# Patient Record
Sex: Female | Born: 1935 | Race: White | Hispanic: No | Smoking: Never smoker
Health system: Southern US, Community
[De-identification: ages and names within clinical notes are randomized; demographics above are authoritative.]

## PROBLEM LIST (undated history)

## (undated) DIAGNOSIS — I10 Essential (primary) hypertension: Secondary | ICD-10-CM

## (undated) DIAGNOSIS — F329 Major depressive disorder, single episode, unspecified: Secondary | ICD-10-CM

## (undated) DIAGNOSIS — I639 Cerebral infarction, unspecified: Secondary | ICD-10-CM

## (undated) DIAGNOSIS — F32A Depression, unspecified: Secondary | ICD-10-CM

## (undated) DIAGNOSIS — D649 Anemia, unspecified: Secondary | ICD-10-CM

## (undated) DIAGNOSIS — B159 Hepatitis A without hepatic coma: Secondary | ICD-10-CM

## (undated) DIAGNOSIS — I499 Cardiac arrhythmia, unspecified: Secondary | ICD-10-CM

## (undated) HISTORY — PX: CATARACT EXTRACTION W/ INTRAOCULAR LENS IMPLANT: SHX1309

---

## 1964-08-04 HISTORY — PX: OOPHORECTOMY: SHX86

## 1966-11-03 HISTORY — PX: BREAST SURGERY: SHX581

## 1968-01-03 HISTORY — PX: ABDOMINAL HYSTERECTOMY: SHX81

## 1976-01-03 DIAGNOSIS — B159 Hepatitis A without hepatic coma: Secondary | ICD-10-CM

## 1976-01-03 HISTORY — DX: Hepatitis a without hepatic coma: B15.9

## 2001-02-01 DIAGNOSIS — I499 Cardiac arrhythmia, unspecified: Secondary | ICD-10-CM

## 2001-02-01 HISTORY — DX: Cardiac arrhythmia, unspecified: I49.9

## 2008-07-04 DIAGNOSIS — D649 Anemia, unspecified: Secondary | ICD-10-CM

## 2008-07-04 HISTORY — DX: Anemia, unspecified: D64.9

## 2008-08-04 HISTORY — PX: COLON SURGERY: SHX602

## 2012-03-04 HISTORY — PX: PANCREATIC CYST DRAINAGE: SHX2156

## 2013-02-16 ENCOUNTER — Other Ambulatory Visit (HOSPITAL_COMMUNITY): Payer: Self-pay | Admitting: *Deleted

## 2013-02-16 DIAGNOSIS — C189 Malignant neoplasm of colon, unspecified: Secondary | ICD-10-CM

## 2013-02-16 DIAGNOSIS — D490 Neoplasm of unspecified behavior of digestive system: Secondary | ICD-10-CM

## 2013-03-03 ENCOUNTER — Ambulatory Visit (HOSPITAL_COMMUNITY): Payer: Self-pay

## 2013-03-30 ENCOUNTER — Other Ambulatory Visit (HOSPITAL_COMMUNITY): Payer: Self-pay

## 2013-03-31 ENCOUNTER — Ambulatory Visit (HOSPITAL_COMMUNITY)
Admission: RE | Admit: 2013-03-31 | Discharge: 2013-03-31 | Disposition: A | Discharge status: Home / Self Care | Payer: Medicare Other | Source: Ambulatory Visit | Attending: Oncology | Admitting: Oncology

## 2013-03-31 DIAGNOSIS — C189 Malignant neoplasm of colon, unspecified: Secondary | ICD-10-CM | POA: Insufficient documentation

## 2013-03-31 DIAGNOSIS — K862 Cyst of pancreas: Secondary | ICD-10-CM | POA: Insufficient documentation

## 2013-03-31 DIAGNOSIS — Q619 Cystic kidney disease, unspecified: Secondary | ICD-10-CM | POA: Insufficient documentation

## 2013-03-31 DIAGNOSIS — I700 Atherosclerosis of aorta: Secondary | ICD-10-CM | POA: Insufficient documentation

## 2013-03-31 DIAGNOSIS — D490 Neoplasm of unspecified behavior of digestive system: Secondary | ICD-10-CM

## 2013-03-31 LAB — CREATININE, SERUM
Creatinine, Ser: 0.83 mg/dL (ref 0.50–1.10)
GFR calc non Af Amer: 65 mL/min — ABNORMAL LOW (ref >90)

## 2013-03-31 LAB — BUN: BUN: 16 mg/dL (ref 6–23)

## 2013-03-31 MED ORDER — IOHEXOL 300 MG/ML  SOLN
100.0000 mL | Freq: Once | INTRAMUSCULAR | Status: AC | PRN
  Administered 2013-03-31: 100 mL via INTRAVENOUS

## 2014-09-21 ENCOUNTER — Inpatient Hospital Stay (HOSPITAL_COMMUNITY)
Admission: AD | Admit: 2014-09-21 | Discharge: 2014-09-26 | DRG: 065 | Disposition: A | Discharge status: Rehabilitation Facility | Payer: Medicare Other | Source: Other Acute Inpatient Hospital | Attending: Neurosurgery | Admitting: Neurosurgery

## 2014-09-21 ENCOUNTER — Inpatient Hospital Stay (HOSPITAL_COMMUNITY): Payer: Medicare Other

## 2014-09-21 DIAGNOSIS — H4922 Sixth [abducent] nerve palsy, left eye: Secondary | ICD-10-CM | POA: Diagnosis present

## 2014-09-21 DIAGNOSIS — I609 Nontraumatic subarachnoid hemorrhage, unspecified: Secondary | ICD-10-CM

## 2014-09-21 DIAGNOSIS — I10 Essential (primary) hypertension: Secondary | ICD-10-CM | POA: Diagnosis present

## 2014-09-21 DIAGNOSIS — R291 Meningismus: Secondary | ICD-10-CM | POA: Diagnosis present

## 2014-09-21 DIAGNOSIS — D649 Anemia, unspecified: Secondary | ICD-10-CM | POA: Diagnosis present

## 2014-09-21 DIAGNOSIS — F329 Major depressive disorder, single episode, unspecified: Secondary | ICD-10-CM | POA: Diagnosis present

## 2014-09-21 DIAGNOSIS — I608 Other nontraumatic subarachnoid hemorrhage: Principal | ICD-10-CM | POA: Diagnosis present

## 2014-09-21 DIAGNOSIS — G919 Hydrocephalus, unspecified: Secondary | ICD-10-CM | POA: Diagnosis present

## 2014-09-21 DIAGNOSIS — Z79899 Other long term (current) drug therapy: Secondary | ICD-10-CM | POA: Diagnosis not present

## 2014-09-21 DIAGNOSIS — H532 Diplopia: Secondary | ICD-10-CM | POA: Diagnosis present

## 2014-09-21 DIAGNOSIS — R29818 Other symptoms and signs involving the nervous system: Secondary | ICD-10-CM

## 2014-09-21 HISTORY — DX: Cardiac arrhythmia, unspecified: I49.9

## 2014-09-21 HISTORY — DX: Hepatitis a without hepatic coma: B15.9

## 2014-09-21 HISTORY — DX: Anemia, unspecified: D64.9

## 2014-09-21 LAB — MRSA PCR SCREENING: MRSA BY PCR: NEGATIVE

## 2014-09-21 MED ORDER — IOHEXOL 350 MG/ML SOLN: 50.0000 mL | Freq: Once | INTRAVENOUS | Status: AC | PRN

## 2014-09-21 MED ORDER — LABETALOL HCL 5 MG/ML IV SOLN: 10.0000 mg | Freq: Two times a day (BID) | INTRAVENOUS | Status: DC | PRN

## 2014-09-21 MED ORDER — DEXAMETHASONE SODIUM PHOSPHATE 10 MG/ML IJ SOLN
6.0000 mg | Freq: Four times a day (QID) | INTRAMUSCULAR | Status: DC
  Administered 2014-09-21 – 2014-09-26 (×23): 6 mg via INTRAVENOUS
  Filled 2014-09-21 (×3): qty 0.6
  Filled 2014-09-21: qty 1
  Filled 2014-09-21: qty 0.6
  Filled 2014-09-21: qty 1
  Filled 2014-09-21: qty 0.6
  Filled 2014-09-21: qty 1
  Filled 2014-09-21 (×6): qty 0.6
  Filled 2014-09-21: qty 1
  Filled 2014-09-21: qty 0.6
  Filled 2014-09-21: qty 1
  Filled 2014-09-21: qty 0.6
  Filled 2014-09-21: qty 1
  Filled 2014-09-21 (×2): qty 0.6
  Filled 2014-09-21 (×2): qty 1
  Filled 2014-09-21 (×5): qty 0.6

## 2014-09-21 MED ORDER — CETYLPYRIDINIUM CHLORIDE 0.05 % MT LIQD: 7.0000 mL | Freq: Two times a day (BID) | OROMUCOSAL | Status: DC

## 2014-09-21 MED ORDER — HYDROCODONE-ACETAMINOPHEN 5-325 MG PO TABS
1.0000 | ORAL_TABLET | ORAL | Status: DC | PRN
  Administered 2014-09-21 – 2014-09-26 (×6): 1 via ORAL
  Filled 2014-09-21 (×7): qty 1

## 2014-09-21 MED ORDER — MORPHINE SULFATE 2 MG/ML IJ SOLN
2.0000 mg | INTRAMUSCULAR | Status: DC | PRN
  Administered 2014-09-21: 2 mg via INTRAVENOUS
  Filled 2014-09-21 (×2): qty 1

## 2014-09-21 MED ORDER — NIMODIPINE 30 MG PO CAPS
60.0000 mg | ORAL_CAPSULE | Freq: Four times a day (QID) | ORAL | Status: DC
  Administered 2014-09-21 – 2014-09-22 (×4): 60 mg via ORAL
  Filled 2014-09-21 (×5): qty 2

## 2014-09-21 MED ORDER — LACTATED RINGERS IV SOLN
INTRAVENOUS | Status: DC
  Administered 2014-09-21 – 2014-09-26 (×7): via INTRAVENOUS

## 2014-09-21 MED ORDER — CHLORHEXIDINE GLUCONATE 0.12 % MT SOLN
15.0000 mL | Freq: Two times a day (BID) | OROMUCOSAL | Status: DC
  Administered 2014-09-21 – 2014-09-22 (×2): 15 mL via OROMUCOSAL
  Filled 2014-09-21 (×2): qty 15

## 2014-09-21 MED ORDER — ONDANSETRON HCL 4 MG/2ML IJ SOLN: 4.0000 mg | Freq: Four times a day (QID) | INTRAMUSCULAR | Status: DC

## 2014-09-21 MED ORDER — ONDANSETRON HCL 4 MG/2ML IJ SOLN
4.0000 mg | Freq: Four times a day (QID) | INTRAMUSCULAR | Status: DC | PRN
Start: 2014-09-21 — End: 2014-09-26

## 2014-09-21 NOTE — Progress Notes (Signed)
Pt transferred here from [facility]. Daughter updated and at bedside.

## 2014-09-21 NOTE — H&P (Signed)
Subjective: The patient is an 81 year old white female who by report had a sudden onset of headache yesterday. She does not recall any precipitating events. There is no history of trauma. The patient went to the [facility] ER and was evaluated with a head CT. This demonstrated a subarachnoid hemorrhage. The ER doctor request a transfer to [facility].  Presently the patient is accompanied by her daughter who feels that a lot of the details. The patient admits to a headache but says "it's not too bad". She denies trauma, neck pain, meningismus, etc.   No past medical history on file.  No past surgical history on file.  No Known Allergies  History  Substance Use Topics  . Smoking status: Not on file  . Smokeless tobacco: Not on file  . Alcohol Use: Not on file    No family history on file. Prior to Admission medications   Medication Sig Start Date End Date Taking? Authorizing Provider  alendronate (FOSAMAX) 70 MG tablet Take 70 mg by mouth once a week. Take with a full glass of water on an empty stomach.   Yes Historical Provider, MD  fluticasone (FLONASE) 50 MCG/ACT nasal spray Place 2 sprays into both nostrils daily as needed for allergies or rhinitis.   Yes Historical Provider, MD  loratadine (CLARITIN) 10 MG tablet Take 10 mg by mouth daily.   Yes Historical Provider, MD  mirtazapine (REMERON) 15 MG tablet Take 15 mg by mouth at bedtime.   Yes Historical Provider, MD  polyethylene glycol (MIRALAX / GLYCOLAX) packet Take 17 g by mouth daily.   Yes Historical Provider, MD     Review of Systems  Positive ROS: As above  All other systems have been reviewed and were otherwise negative with the exception of those mentioned in the HPI and as above.  Objective: Vital signs in last 24 hours: Temp:  [97.6 F (36.4 C)-98.6 F (37 C)] 98.5 F (36.9 C) (02/18 1147) Pulse Rate:  [79-96] 96 (02/18 0900) Resp:  [12-20] 19 (02/18 0900) BP: (124-160)/(53-70) 143/65 mmHg (02/18  0900) SpO2:  [95 %-100 %] 98 % (02/18 0900) Weight:  [60.4 kg (133 lb 2.5 oz)] 60.4 kg (133 lb 2.5 oz) (02/18 0254)  Physical exam:  General: An alert and pleasant 81 year old white female in no apparent distress. She has somewhat of a flat affect.  HEENT: Normocephalic, atraumatic, pupils equal round reactive light, extraocular muscles are intact  Neck: Supple without masses, deformities, meningismus, etc.  Thorax: Symmetric  Abdomen: Soft  Extremities: Unremarkable  Neurologic exam: The patient is Glasgow Coma Scale 14,[id number]. She is alert and oriented 1, person. She thinks she is in [place]. She could not tell me the month. The patient's motor strength is 5 over 5 in her bilateral bicep, tricep, hand grip, gastrocnemius, dorsiflexors . Cerebellar function is intact to rapid alternating movements of the upper extremities bilaterally. Sensory function is intact to light touch sensation all tested dermatomes bilaterally. Cranial nerves II through XII were examined bilaterally and grossly normal. Vision and hearing are grossly normal bilaterally.  I have reviewed the patient's head CT performed at [facility] today. There is a subarachnoid hemorrhage in the basal cisterns with interventricular hemorrhage and mild hydrocephalus.  By report the CT angiogram is negative except for a small abnormality in the cavernous sinus.  Data Review No results found for: WBC, HGB, HCT, MCV, PLT Lab Results  Component Value Date   BUN 16 03/31/2013   CREATININE 0.83 03/31/2013  No results found for: INR, PROTIME  Assessment/Plan: Nontraumatic subarachnoid hemorrhage, intraventricular hemorrhage, hydrocephalus: I have discussed the situation with the patient and her daughter. The patient will need further workup with a cerebral arteriogram or a repeat CT angiogram to rule out aneurysm, arteriovenous malformation, etc. We will continue to observe her in the ICU for worsening  hydrocephalus, vasospasm, recurrent hemorrhage, etc. I have answered all the patient, and her daughters, questions. Dr. Kathyrn Sheriff is going to discuss an arteriogram with the patient.  Garris Melhorn D 09/21/2014 12:22 PM

## 2014-09-21 NOTE — Progress Notes (Signed)
eLink Physician-Brief Progress Note Patient Name: Diana Hawkins DOB: 02/27/1932 MRN: [id number]   Date of Service  09/21/2014  HPI/Events of Note  New arrival from an outside hospital Neurosurgery primary service [facility] per discussion with RN BP stable Patient comfortable on camera check  eICU Interventions  No eICU intervention     Intervention Category Evaluation Type: New Patient Evaluation  Draven Natter 09/21/2014, 3:47 AM

## 2014-09-22 ENCOUNTER — Inpatient Hospital Stay (HOSPITAL_COMMUNITY): Payer: Medicare Other

## 2014-09-22 ENCOUNTER — Encounter (HOSPITAL_COMMUNITY): Payer: Self-pay | Admitting: *Deleted

## 2014-09-22 DIAGNOSIS — I609 Nontraumatic subarachnoid hemorrhage, unspecified: Secondary | ICD-10-CM

## 2014-09-22 HISTORY — PX: IR GENERIC HISTORICAL: IMG1180011

## 2014-09-22 MED ORDER — FENTANYL CITRATE 0.05 MG/ML IJ SOLN
INTRAMUSCULAR | Status: AC | PRN
  Administered 2014-09-22: 25 ug via INTRAVENOUS

## 2014-09-22 MED ORDER — IOHEXOL 300 MG/ML  SOLN
150.0000 mL | Freq: Once | INTRAMUSCULAR | Status: AC | PRN
  Administered 2014-09-22: 50 mL via INTRA_ARTERIAL

## 2014-09-22 MED ORDER — LIDOCAINE HCL 1 % IJ SOLN
INTRAMUSCULAR | Status: AC
  Filled 2014-09-22: qty 20

## 2014-09-22 MED ORDER — MIDAZOLAM HCL 2 MG/2ML IJ SOLN
INTRAMUSCULAR | Status: AC | PRN
  Administered 2014-09-22: 0.5 mg via INTRAVENOUS

## 2014-09-22 MED ORDER — MIDAZOLAM HCL 2 MG/2ML IJ SOLN
INTRAMUSCULAR | Status: AC
  Filled 2014-09-22: qty 2

## 2014-09-22 MED ORDER — FENTANYL CITRATE 0.05 MG/ML IJ SOLN
INTRAMUSCULAR | Status: AC
  Filled 2014-09-22: qty 2

## 2014-09-22 MED ORDER — NIMODIPINE 60 MG/20ML PO SOLN
60.0000 mg | Freq: Four times a day (QID) | ORAL | Status: DC
  Administered 2014-09-22 – 2014-09-26 (×19): 60 mg via ORAL
  Filled 2014-09-22 (×21): qty 20

## 2014-09-22 NOTE — Sedation Documentation (Signed)
Patient denies pain and is resting comfortably.  

## 2014-09-22 NOTE — Progress Notes (Signed)
Pt seen and examined. No issues overnight. Doing well this am, minimal HA. Has blurry vision.  I reviewed her admission history from the EMR and from the patient and her daughter.   EXAM: Temp:  [97.9 F (36.6 C)-99 F (37.2 C)] 98.3 F (36.8 C) (02/19 0737) Pulse Rate:  [66-96] 72 (02/19 0800) Resp:  [11-20] 13 (02/19 0800) BP: (108-143)/(45-72) 132/58 mmHg (02/19 0800) SpO2:  [94 %-100 %] 97 % (02/19 0800) Intake/Output      02/18 0701 - 02/19 0700 02/19 0701 - 02/20 0700   I.V. (mL/kg) 1800 (29.8)    Total Intake(mL/kg) 1800 (29.8)    Urine (mL/kg/hr) 325 (0.2)    Stool     Total Output 325     Net +1475          Urine Occurrence 9 x     Awake, alert, oriented Speech fluent CN grossly intact x left CN VI palsy Good strength throughout  LABS: Lab Results  Component Value Date   CREATININE 0.83 03/31/2013   BUN 16 03/31/2013   No results found for: WBC, HGB, HCT, MCV, PLT  IMAGING: CTA reviewed, no identifiable intracranial aneurysm is seen. Diffuse basal SAH, with mild ventriculomegaly.  IMPRESSION: - 81 y.o. female Hunt-Hess 2 Fisher 3/4 SAH with CTA negative for aneurysm.  PLAN: - Will do diagnostic angiogram today  I spoke with the patient and her daughter last night and explained to them that intracranial aneurysm was the most common non-traumatic cause for [facility] and that the definitive diagnosis is made by diagnostic angiogram. The risks of the angiogram were also reviewed to include stroke. The patient and her duaghter appeared to understand our discussion and the daughter provided consent to proceed with diagnostic angiogram.

## 2014-09-22 NOTE — Progress Notes (Signed)
UR completed.  Logann Whitebread, RN BSN MHA CCM Trauma/Neuro ICU Case Manager [phone]  

## 2014-09-22 NOTE — Brief Op Note (Signed)
PREOP DX: SAH  POSTOP DX: Same  PROCEDURE: Diagnostic cerebral angiogram  SURGEON: Dr. [name], MD  ANESTHESIA: IV Sedation with Local  EBL: Minimal  SPECIMENS: None  COMPLICATIONS: None  CONDITION: Stable to recovery  FINDINGS: 1. No intracranial aneurysms, AVM, or high-flow fistulas seen. 2. No evidence of vasospasm

## 2014-09-22 NOTE — Progress Notes (Addendum)
Pt began complaining of seeing things in "double vision." Some blurriness present. Seems to be more blurry in left eye. Partial gaze palsy on L side. Patient alert and oriented x 4. No other neuro changes. MD notified. Will continue to monitor.

## 2014-09-22 NOTE — Progress Notes (Signed)
[name], female   DOB: 06-Oct-1931, 81 y.o.   MRN: [id number] Subjective:  The patient is alert and pleasant. She looks better than yesterday. She has some diplopia. She says her headache is better than yesterday. She has some meningismus.  Objective: Vital signs in last 24 hours: Temp:  [97.9 F (36.6 C)-99 F (37.2 C)] 98 F (36.7 C) (02/19 0355) Pulse Rate:  [66-96] 74 (02/19 0700) Resp:  [11-20] 13 (02/19 0700) BP: (108-143)/(45-72) 108/72 mmHg (02/19 0700) SpO2:  [94 %-100 %] 100 % (02/19 0700)  Intake/Output from previous day: 02/18 0701 - 02/19 0700 In: 1800 [I.V.:1800] Out: 325 [Urine:325] Intake/Output this shift:    Physical exam the patient is alert and oriented 3, Glasgow Coma Scale 15. Her strength is normal in all 4 extremities. Her speech is normal. She has a left sixth nerve palsy.  Lab Results: No results for input(s): WBC, HGB, HCT, PLT in the last 72 hours. BMET No results for input(s): NA, K, CL, CO2, GLUCOSE, BUN, CREATININE, CALCIUM in the last 72 hours.  Studies/Results: Ct Angio Head W/cm &/or Wo Cm  09/21/2014   CLINICAL DATA:  Subarachnoid hemorrhage.  Rule out aneurysm.  EXAM: CT ANGIOGRAPHY HEAD  TECHNIQUE: Multidetector CT imaging of the head was performed using the standard protocol during bolus administration of intravenous contrast. Multiplanar CT image reconstructions and MIPs were obtained to evaluate the vascular anatomy.  CONTRAST:  50 mL Omnipaque 350 IV  COMPARISON:  None.  FINDINGS: CT HEAD  Brain: Moderate volume subarachnoid hemorrhage. Subarachnoid hemorrhage is centered in the left suprasellar cistern extending into the left perimesencephalic cistern and left sylvian fissure. Smaller amount of blood is present on the right. There is intraventricular extension of blood with mild hydrocephalus. Mild chronic microvascular ischemia. No acute infarct or mass lesion identified.  Calvarium and skull base: Negative  Paranasal sinuses:  Negative  Orbits: Negative  CTA HEAD  Anterior circulation: Internal carotid artery is widely patent bilaterally with mild atherosclerotic calcification but no significant stenosis. The anterior and middle cerebral arteries are widely patent. Negative for vasospasm.  Small 1 mm bump in the cavernous carotid projecting medially just above the ophthalmic artery. This is quite small and may be related atherosclerotic disease or a small aneurysm. This seems unlikely as the cause of the current subarachnoid hemorrhage.  Posterior circulation: Both vertebral arteries are widely patent to the basilar. PICA not well visualized. AICA, superior cerebellar, posterior cerebral arteries are widely patent bilaterally. No aneurysm in the posterior circulation.  Venous sinuses: Limited opacification of the dural sinuses without thrombosis.  Anatomic variants: None  Delayed phase:Normal enhancement.  3D volume rendered images of the circle Willis were obtained and sent to [facility] for review.  IMPRESSION: Moderate subarachnoid hemorrhage.  Negative for posterior communicating artery aneurysm. There is a small 1 mm bump on the left medial cavernous carotid which could be related to atherosclerotic disease or a small aneurysm. This may be unrelated to the hemorrhage. Close follow-up with either CTA or follow-up catheter angiography suggested for further evaluation.  Mild hydrocephalus with intraventricular hemorrhage.   Electronically Signed   By: Franchot Gallo M.D.   On: 09/21/2014 11:57    Assessment/Plan: Nontraumatic subarachnoid hemorrhage, intraventricular hemorrhage, hydrocephalus: The patient looks better and her headache has improved since yesterday. She does have a left sixth nerve palsy which I suspect is secondary to her hydrocephalus. We will continue with observation as I think the risk of lacing a ventriculostomy for hydrocephalus  outweigh the benefits at this point. Dr. Kathyrn Sheriff plans to do an arteriogram on her in  the near future. Her CT angiogram yesterday was negative.  LOS: 1 day     Haroun Cotham D 09/22/2014, 7:25 AM

## 2014-09-23 MED ORDER — POLYETHYLENE GLYCOL 3350 17 G PO PACK
17.0000 g | PACK | Freq: Every day | ORAL | Status: DC | PRN
  Filled 2014-09-23: qty 1

## 2014-09-23 NOTE — Progress Notes (Signed)
[name], female   DOB: 1932/01/25, 81 y.o.   MRN: [id number] Subjective: Patient reports feeling good  Objective: Vital signs in last 24 hours: Temp:  [97.7 F (36.5 C)-98.7 F (37.1 C)] 97.7 F (36.5 C) (02/20 0800) Pulse Rate:  [58-85] 81 (02/20 1000) Resp:  [12-18] 15 (02/20 1000) BP: (98-140)/(37-60) 129/50 mmHg (02/20 1000) SpO2:  [94 %-100 %] 100 % (02/20 1000)  Intake/Output from previous day: 02/19 0701 - 02/20 0700 In: 1845 [P.O.:120; I.V.:1725] Out: 1750 [Urine:1750] Intake/Output this shift: Total I/O In: 225 [I.V.:225] Out: 225 [Urine:225]  she is awake, alert, converasant; follows complex commands  Lab Results: No results for input(s): WBC, HGB, HCT, PLT in the last 72 hours. BMET No results for input(s): NA, K, CL, CO2, GLUCOSE, BUN, CREATININE, CALCIUM in the last 72 hours.  Studies/Results: Ct Angio Head W/cm &/or Wo Cm  09/21/2014   CLINICAL DATA:  Subarachnoid hemorrhage.  Rule out aneurysm.  EXAM: CT ANGIOGRAPHY HEAD  TECHNIQUE: Multidetector CT imaging of the head was performed using the standard protocol during bolus administration of intravenous contrast. Multiplanar CT image reconstructions and MIPs were obtained to evaluate the vascular anatomy.  CONTRAST:  50 mL Omnipaque 350 IV  COMPARISON:  None.  FINDINGS: CT HEAD  Brain: Moderate volume subarachnoid hemorrhage. Subarachnoid hemorrhage is centered in the left suprasellar cistern extending into the left perimesencephalic cistern and left sylvian fissure. Smaller amount of blood is present on the right. There is intraventricular extension of blood with mild hydrocephalus. Mild chronic microvascular ischemia. No acute infarct or mass lesion identified.  Calvarium and skull base: Negative  Paranasal sinuses: Negative  Orbits: Negative  CTA HEAD  Anterior circulation: Internal carotid artery is widely patent bilaterally with mild atherosclerotic calcification but no significant stenosis. The  anterior and middle cerebral arteries are widely patent. Negative for vasospasm.  Small 1 mm bump in the cavernous carotid projecting medially just above the ophthalmic artery. This is quite small and may be related atherosclerotic disease or a small aneurysm. This seems unlikely as the cause of the current subarachnoid hemorrhage.  Posterior circulation: Both vertebral arteries are widely patent to the basilar. PICA not well visualized. AICA, superior cerebellar, posterior cerebral arteries are widely patent bilaterally. No aneurysm in the posterior circulation.  Venous sinuses: Limited opacification of the dural sinuses without thrombosis.  Anatomic variants: None  Delayed phase:Normal enhancement.  3D volume rendered images of the circle Willis were obtained and sent to [facility] for review.  IMPRESSION: Moderate subarachnoid hemorrhage.  Negative for posterior communicating artery aneurysm. There is a small 1 mm bump on the left medial cavernous carotid which could be related to atherosclerotic disease or a small aneurysm. This may be unrelated to the hemorrhage. Close follow-up with either CTA or follow-up catheter angiography suggested for further evaluation.  Mild hydrocephalus with intraventricular hemorrhage.   Electronically Signed   By: Franchot Gallo M.D.   On: 09/21/2014 11:57    Assessment/Plan: \Doing well. Had negative angio yesterday. If doing well, may transfer to floor tomorrow or Monday.   LOS: 2 days  as above   Faythe Ghee, MD 09/23/2014, 10:29 AM

## 2014-09-24 MED ORDER — ACETAMINOPHEN 325 MG PO TABS
650.0000 mg | ORAL_TABLET | ORAL | Status: DC | PRN
  Filled 2014-09-24: qty 2

## 2014-09-24 MED ORDER — POLYETHYLENE GLYCOL 3350 17 G PO PACK
17.0000 g | PACK | Freq: Every day | ORAL | Status: DC
  Administered 2014-09-24 – 2014-09-25 (×2): 17 g via ORAL
  Filled 2014-09-24 (×2): qty 1

## 2014-09-24 NOTE — Progress Notes (Signed)
Rehab Admissions Coordinator Note:  Patient was screened by Cleatrice Burke for appropriateness for an Inpatient Acute Rehab Consult per PT recommendation. At this time, we are recommending Inpatient Rehab consult.  Cleatrice Burke 09/24/2014, 4:07 PM  I can be reached at [phone].

## 2014-09-24 NOTE — Progress Notes (Signed)
[name], female   DOB: [birth year], 81 y.o.   MRN: [id number] Subjective: Patient reports feeling good  Objective: Vital signs in last 24 hours: Temp:  [98 F (36.7 C)-98.7 F (37.1 C)] 98.3 F (36.8 C) (02/21 0800) Pulse Rate:  [55-112] 77 (02/21 0900) Resp:  [12-16] 15 (02/21 0900) BP: (110-145)/(46-97) 137/59 mmHg (02/21 0900) SpO2:  [97 %-100 %] 100 % (02/21 0900)  Intake/Output from previous day: 02/20 0701 - 02/21 0700 In: 1960 [P.O.:160; I.V.:1800] Out: 525 [Urine:525] Intake/Output this shift: Total I/O In: 325 [P.O.:100; I.V.:225] Out: 150 [Urine:150]  no neuro issues  Lab Results: No results for input(s): WBC, HGB, HCT, PLT in the last 72 hours. BMET No results for input(s): NA, K, CL, CO2, GLUCOSE, BUN, CREATININE, CALCIUM in the last 72 hours.  Studies/Results: No results found.  Assessment/Plan: Continue present rx and watch for signs of spasm though less likely in light of negative angio   LOS: 3 days  as above   Faythe Ghee, MD 09/24/2014, 10:33 AM

## 2014-09-24 NOTE — Evaluation (Signed)
Physical Therapy Evaluation Patient Details Name: Diana Hawkins MRN: [id number] DOB: 14-Feb-1932 Today's Date: 09/24/2014   History of Present Illness  Pt is an 81 y.o. female admitted with diagnosis of SAH.  Clinical Impression  Pt admitted with above diagnosis. Pt currently with functional limitations due to the deficits listed below (see PT Problem List).  Pt will benefit from skilled PT to increase their independence and safety with mobility to allow discharge to the venue listed below.       Follow Up Recommendations CIR;Supervision/Assistance - 24 hour    Equipment Recommendations  None recommended by PT    Recommendations for Other Services OT consult     Precautions / Restrictions Precautions Precautions: Fall      Mobility  Bed Mobility               General bed mobility comments: Pt received in recliner.  Transfers Overall transfer level: Needs assistance Equipment used: None Transfers: Sit to/from [facility] Sit to Stand: Min assist Stand pivot transfers: Min assist       General transfer comment: verbal cues for sequencing  Ambulation/Gait Ambulation/Gait assistance: Min assist Ambulation Distance (Feet): 25 Feet Assistive device: 1 person hand held assist Gait Pattern/deviations: Step-through pattern;Decreased stride length Gait velocity: decreased   General Gait Details: slow, purposeful steps  Stairs            Wheelchair Mobility    Modified Rankin (Stroke Patients Only)       Balance Overall balance assessment: Needs assistance Sitting-balance support: No upper extremity supported;Feet supported Sitting balance-Leahy Scale: Good     Standing balance support: Single extremity supported;During functional activity Standing balance-Leahy Scale: Poor                               Pertinent Vitals/Pain Pain Assessment: No/denies pain    Home Living Family/patient expects to be discharged to::  Private residence Living Arrangements: Children Available Help at Discharge: Family;Available PRN/intermittently (daughter works during the day) Type of Home: House Home Access: Level entry     Home Layout: One level [place]: [place] - single point      Prior Function Level of Independence: Independent               Hand Dominance   Dominant Hand: Right    Extremity/Trunk Assessment   Upper Extremity Assessment: Defer to OT evaluation           Lower Extremity Assessment: Generalized weakness (grossly 4-/5 bilat.)      Cervical / Trunk Assessment: Normal  Communication   Communication: No difficulties  Cognition Arousal/Alertness: Awake/alert Behavior During Therapy: Flat affect Overall Cognitive Status: Within Functional Limits for tasks assessed                      General Comments      Exercises        Assessment/Plan    PT Assessment Patient needs continued PT services  PT Diagnosis Abnormality of gait;Generalized weakness   PT Problem List Decreased strength;Decreased activity tolerance;Decreased balance;Decreased mobility;Decreased coordination;Decreased knowledge of use of DME  PT Treatment Interventions DME instruction;Gait training;Stair training;Functional mobility training;Therapeutic activities;Therapeutic exercise;Patient/family education;Balance training   PT Goals (Current goals can be found in the Care Plan section) Acute Rehab PT Goals Patient Stated Goal: get stronger PT Goal Formulation: With patient Time For Goal Achievement: 10/08/14 Potential to Achieve Goals: Good  Frequency Min 4X/week   Barriers to discharge        Co-evaluation               End of Session Equipment Utilized During Treatment: Gait belt Activity Tolerance: Patient tolerated treatment well Patient left: in chair Nurse Communication: Mobility status         Time: 5208-0223 PT Time Calculation (min) (ACUTE ONLY): 30  min   Charges:   PT Evaluation $Initial PT Evaluation Tier I: 1 Procedure PT Treatments $Gait Training: 8-22 mins   PT G Codes:        Lorriane Shire 09/24/2014, 2:45 PM

## 2014-09-25 DIAGNOSIS — I609 Nontraumatic subarachnoid hemorrhage, unspecified: Secondary | ICD-10-CM

## 2014-09-25 MED ORDER — LORATADINE 10 MG PO TABS
10.0000 mg | ORAL_TABLET | Freq: Every day | ORAL | Status: DC
  Administered 2014-09-25 – 2014-09-26 (×2): 10 mg via ORAL
  Filled 2014-09-25 (×2): qty 1

## 2014-09-25 NOTE — Progress Notes (Signed)
Physical Therapy Treatment Patient Details Name: Diana Hawkins MRN: [id number] DOB: 08/07/1931 Today's Date: 09/25/2014    History of Present Illness Pt is an 81 y.o. female admitted with diagnosis of SAH. Colon CA III, depression    PT Comments    Pt mobility limited today by feeling lightheaded from pain meds and from tightness/pain in Bil thighs.  continue to feel pt would be appropriate for CIR at D/C.  Will continue to follow.    Follow Up Recommendations  CIR     Equipment Recommendations  None recommended by PT    Recommendations for Other Services       Precautions / Restrictions Precautions Precautions: Fall Restrictions Weight Bearing Restrictions: No    Mobility  Bed Mobility Overal bed mobility: Needs Assistance Bed Mobility: Supine to Sit     Supine to sit: Min assist     General bed mobility comments: A with bring trunk up to sitting.    Transfers Overall transfer level: Needs assistance Equipment used: Rolling walker (2 wheeled) Transfers: Sit to/from Stand Sit to Stand: Min guard         General transfer comment: cues for UE use and controlling descent to sitting.    Ambulation/Gait Ambulation/Gait assistance: Min assist Ambulation Distance (Feet): 20 Feet Assistive device: Rolling walker (2 wheeled) Gait Pattern/deviations: Step-through pattern;Decreased stride length;Trunk flexed     General Gait Details: pt moves slowly and cautiously.  pt indicating feeling lightheaded after getting pain meds and states pain meds do this to her.     Stairs            Wheelchair Mobility    Modified Rankin (Stroke Patients Only) Modified Rankin (Stroke Patients Only) Pre-Morbid Rankin Score: No significant disability Modified Rankin: Moderately severe disability     Balance Overall balance assessment: Needs assistance Sitting-balance support: No upper extremity supported;Feet supported Sitting balance-Leahy Scale: Good     Standing  balance support: Single extremity supported;During functional activity Standing balance-Leahy Scale: Poor Standing balance comment: pt needs single UE to maintain balance in standing.  pt able to stand for ~4-80mins during peri hygiene, but needed total care for peri hygiene.                      Cognition Arousal/Alertness: Awake/alert Behavior During Therapy: Flat affect Overall Cognitive Status: Within Functional Limits for tasks assessed                      Exercises      General Comments        Pertinent Vitals/Pain Pain Assessment: Faces Faces Pain Scale: Hurts little more Pain Location: Bil thighs Pain Descriptors / Indicators: Aching;Tightness Pain Intervention(s): Monitored during session;Repositioned;RN gave pain meds during session    Home Living Family/patient expects to be discharged to:: Private residence Living Arrangements: Children Available Help at Discharge: Family;Available PRN/intermittently (daughter works as Education officer, museum) Type of Home: House Home Access: Level entry   Home Layout: One [place]: Civil engineer, contracting - built in      Prior Function Level of Independence: Independent      Comments: Does not drive   PT Goals (current goals can now be found in the care plan section) Acute Rehab PT Goals Patient Stated Goal: Get stronger PT Goal Formulation: With patient Time For Goal Achievement: 10/08/14 Potential to Achieve Goals: Good Progress towards PT goals: Progressing toward goals    Frequency  Min 4X/week    PT Plan  Current plan remains appropriate    Co-evaluation             End of Session Equipment Utilized During Treatment: Gait belt Activity Tolerance:  (Limited by lightheaded feeling) Patient left: with nursing/sitter in room (in w/c to transfer to new room.)     Time: 9784-7841 PT Time Calculation (min) (ACUTE ONLY): 28 min  Charges:  $Gait Training: 8-22 mins $Therapeutic Activity: 8-22  mins                    G CodesCatarina Hartshorn, [place] 09/25/2014, 12:13 PM

## 2014-09-25 NOTE — Progress Notes (Signed)
Pt received alert, verbal with no noted distress. Neuro intact, able to move all extremities. She received pain medicine prior to arriving to unit stated that she has relief. Pt oriented to room. Safety measures in place. Will continue to monitor. Received report from nurse, Romelle Starcher.

## 2014-09-25 NOTE — Consult Note (Signed)
Physical Medicine and Rehabilitation Consult Reason for Consult: Nontraumatic subarachnoid hemorrhage Referring Physician: Dr. Kathyrn Sheriff   HPI: Diana Hawkins is a 81 y.o. right handed female with history of colon cancer 2010 with resection and chronic anemia. Presented 09/21/2014 upon transfer from [facility]. She lives with her daughter and was independent with straight point cane prior to admission but rather sedentary and didn't get out of the house much. Patient with acute onset of headache and altered mental status. CT of the head showed moderate subarachnoid hemorrhage. Negative for posterior communicating artery aneurysm. There is mild hydrocephalus with intraventricular hemorrhage. CT angiogram negative for AVM without evidence of vasospasm. Neurosurgery Dr. Kathyrn Sheriff with conservative care. Decadron protocol is indicated. Tolerating a regular diet. Physical occupational therapy evaluation completed 09/24/2014 with recommendations of physical medicine rehabilitation consult.   Review of Systems  Cardiovascular:       Irregular heartbeat  Musculoskeletal: Positive for myalgias and back pain.  Neurological: Positive for headaches.  Psychiatric/Behavioral: Positive for depression.  All other systems reviewed and are negative.  Past Medical History  Diagnosis Date  . Hepatitis A 01/1976  . Irregular heartbeat 02/2001    Irregular heartbeat checked  . Anemia 07/2008    extreme anemia   Past Surgical History  Procedure Laterality Date  . Colon surgery  1/10    Stage 3 Cancer  . Breast surgery Right 11/1966    Benign tumor right breast removed  . Abdominal hysterectomy  01/1968    Complete   . Oophorectomy Right 08/1964    Right Ovary Removed, benign tumor  . Cataract extraction w/ intraocular lens implant Bilateral 04/03 L eye; 06/03 R eye  . Pancreatic cyst drainage  03/2012    Benign   History reviewed. No pertinent family history. Social History:  has  no tobacco, alcohol, and drug history on file. Allergies:  Allergies  Allergen Reactions  . Codeine Other (See Comments)    "feels like I want to climb the walls"  . Reglan [Metoclopramide]    Medications Prior to Admission  Medication Sig Dispense Refill  . alendronate (FOSAMAX) 70 MG tablet Take 70 mg by mouth once a week. Take with a full glass of water on an empty stomach.    . Calcium Carbonate-Vitamin D (CALCIUM + D PO) Take 1 tablet by mouth 2 (two) times daily.    . fluticasone (FLONASE) 50 MCG/ACT nasal spray Place 2 sprays into both nostrils daily as needed for allergies or rhinitis.    Marland Kitchen loratadine (CLARITIN) 10 MG tablet Take 10 mg by mouth daily.    . mirtazapine (REMERON) 15 MG tablet Take 15 mg by mouth at bedtime.    . Multiple Vitamins-Minerals (MULTIVITAMIN WITH MINERALS) tablet Take 1 tablet by mouth 2 (two) times daily.    . Omega-3 Fatty Acids (FISH OIL PO) Take 1 capsule by mouth 2 (two) times daily.    . polyethylene glycol (MIRALAX / GLYCOLAX) packet Take 17 g by mouth daily.     . Probiotic Product (PROBIOTIC DAILY PO) Take 1 capsule by mouth daily.    . vitamin B-12 (CYANOCOBALAMIN) 1000 MCG tablet Take 1,000 mcg by mouth daily.      Home: Home Living Family/patient expects to be discharged to:: Private residence Living Arrangements: Children Available Help at Discharge: Family, Available PRN/intermittently (daughter works as Education officer, museum) Type of Home: [place] Access: Level entry [place]: [place]: [place] in  Functional History:  Prior Function Level of Independence: Independent Comments: Does not drive Functional Status:  Mobility: Bed Mobility General bed mobility comments: Pt up in recliner upon arrival Transfers Overall transfer level: Needs assistance Equipment used: Rolling walker (2 wheeled) Transfers: Sit to/from Stand Sit to Stand: Min guard Stand pivot transfers: Min assist General transfer comment:  verbal cues for sequencing Ambulation/Gait Ambulation/Gait assistance: Min assist Ambulation Distance (Feet): 25 Feet Assistive device: 1 person hand held assist Gait Pattern/deviations: Step-through pattern, Decreased stride length Gait velocity: decreased General Gait Details: slow, purposeful steps    ADL: ADL Overall ADL's : Needs assistance/impaired Eating/Feeding: Independent, Sitting Grooming: Min guard, Standing Upper Body Bathing: Set up, Sitting Lower Body Bathing: Minimal assistance (with min guard A sit<>stand) Upper Body Dressing : Set up, Sitting Lower Body Dressing: Moderate assistance (with min guard A sit<>stand) Toilet Transfer: Minimal assistance, Ambulation, Regular Toilet, Grab bars Toileting- Clothing Manipulation and Hygiene: Min guard, Sit to/from stand  Cognition: Cognition Overall Cognitive Status: Within Functional Limits for tasks assessed Orientation Level: Oriented X4 Cognition Arousal/Alertness: Awake/alert Behavior During Therapy: Flat affect Overall Cognitive Status: Within Functional Limits for tasks assessed  Blood pressure 136/72, pulse 89, temperature 98.1 F (36.7 C), temperature source Oral, resp. rate 17, height 5\' 5"  (1.651 m), weight 60.4 kg (133 lb 2.5 oz), SpO2 100 %. Physical Exam  Constitutional: She appears well-developed.  Looks much younger than stated age  HENT:  Head: Normocephalic.  Eyes: EOM are normal.  Neck: Normal range of motion. Neck supple. No thyromegaly present.  Cardiovascular: Normal rate and regular rhythm.   Respiratory: Effort normal and breath sounds normal. No respiratory distress.  GI: Soft. Bowel sounds are normal. She exhibits no distension.  Neurological:  Mood is a bit flat but appropriate. She does make eye contact with examiner. She is able to provide her name age and date of birth as well as follow simple commands. She has fair awareness and insight.  RUE 5/5. LUE nearly 4 to 4+/5. RLE: 4-/5 HF,  4/5 KE and ankle. LLE: 3- HF, 4- KE and 4 adf/apf. Decreased LT and PP in both feet.   Skin: Skin is warm and dry.    No results found for this or any previous visit (from the past 24 hour(s)). No results found.  Assessment/Plan: Diagnosis: Subarachnoid hemorrhage centered in the left suprasellar cistern extending into the left perimesencephalic cistern and left sylvian fissure 1. Does the need for close, 24 hr/day medical supervision in concert with the patient's rehab needs make it unreasonable for this patient to be served in a less intensive setting? Yes 2. Co-Morbidities requiring supervision/potential complications: htn 3. Due to bladder management, bowel management, safety, skin/wound care, disease management, medication administration, pain management and patient education, does the patient require 24 hr/day rehab nursing? Yes 4. Does the patient require coordinated care of a physician, rehab nurse, PT (1-2 hrs/day, 5 days/week) and OT (1-2 hrs/day, 5 days/week) to address physical and functional deficits in the context of the above medical diagnosis(es)? Yes Addressing deficits in the following areas: balance, endurance, locomotion, strength, transferring, bowel/bladder control, bathing, dressing, feeding, grooming, toileting and psychosocial support 5. Can the patient actively participate in an intensive therapy program of at least 3 hrs of therapy per day at least 5 days per week? Yes 6. The potential for patient to make measurable gains while on inpatient rehab is excellent 7. Anticipated functional outcomes upon discharge from inpatient rehab are modified independent  with PT, modified independent with OT, n/a  with SLP. 8. Estimated rehab length of stay to reach the above functional goals is: 10-14 days 9. Does the patient have adequate social supports and living environment to accommodate these discharge functional goals? Yes 10. Anticipated D/C setting: Home 11. Anticipated post  D/C treatments: HH therapy and Outpatient therapy 12. Overall Rehab/Functional Prognosis: excellent  RECOMMENDATIONS: This patient's condition is appropriate for continued rehabilitative care in the following setting: CIR Patient has agreed to participate in recommended program. Yes Note that insurance prior authorization may be required for reimbursement for recommended care.  Comment: Rehab Admissions Coordinator to follow up.  Thanks,  Meredith Staggers, MD, Mellody Drown     09/25/2014

## 2014-09-25 NOTE — Progress Notes (Signed)
I met with pt at bedside and then contacted her daughter by phone to discuss a possible inpt rehab admission when pt felt medically ready. Both are in agreement to admission. I will follow up in the morning and will seek clarification from Dr. Arnoldo Morale of when he feels pt ready for discharge to inpt rehab. [phone]

## 2014-09-25 NOTE — Progress Notes (Signed)
[name], female   DOB: 1931-10-18, 81 y.o.   MRN: [id number] Subjective:  The patient is alert and pleasant. She denies headaches. She has some aching in her legs. She looks well.  Objective: Vital signs in last 24 hours: Temp:  [97.5 F (36.4 C)-98.3 F (36.8 C)] 98.1 F (36.7 C) (02/22 0725) Pulse Rate:  [59-89] 89 (02/22 1007) Resp:  [12-18] 17 (02/22 1007) BP: (107-147)/(51-72) 136/72 mmHg (02/22 1007) SpO2:  [99 %-100 %] 100 % (02/22 1007)  Intake/Output from previous day: 02/21 0701 - 02/22 0700 In: 2000 [P.O.:200; I.V.:1800] Out: 1150 [Urine:1150] Intake/Output this shift: Total I/O In: 225 [I.V.:225] Out: -   Physical exam patient is alert and oriented 3. The patient's motor strength is normal in all 4 extremities. Cranial nerve exam is unremarkable. Her sixth nerve palsy has resolved.  Lab Results: No results for input(s): WBC, HGB, HCT, PLT in the last 72 hours. BMET No results for input(s): NA, K, CL, CO2, GLUCOSE, BUN, CREATININE, CALCIUM in the last 72 hours.  Studies/Results: No results found.  Assessment/Plan: Status post subarachnoid hemorrhage day #4: The patient is doing well. I will transfer her to the floor. I'll ask rehabilitation to see the patient.  LOS: 4 days     Diana Hawkins D 09/25/2014, 10:50 AM

## 2014-09-25 NOTE — Evaluation (Signed)
Occupational Therapy Evaluation Patient Details Name: Diana Hawkins MRN: [id number] DOB: 10-09-1931 Today's Date: 09/25/2014    History of Present Illness Pt is an 81 y.o. female admitted with diagnosis of SAH. Colon CA III, depression   Clinical Impression   This 81 yo female admitted with above presents to acute with flat affect, decreased balance, decreased mobility, increased pain all affecting her ability to care for herself at home with intermittent A/S from daughter. She will benefit from OT on CIR to get to a Mod I to Independent level to go home with daughter.    Follow Up Recommendations  CIR    Equipment Recommendations   (TBD next venue)       Precautions / Restrictions Precautions Precautions: Fall Restrictions Weight Bearing Restrictions: No      Mobility Bed Mobility               General bed mobility comments: Pt up in recliner upon arrival  Transfers Overall transfer level: Needs assistance Equipment used: Rolling walker (2 wheeled) Transfers: Sit to/from Stand Sit to Stand: Min guard              Balance Overall balance assessment: Needs assistance Sitting-balance support: No upper extremity supported;Feet supported Sitting balance-Leahy Scale: Good     Standing balance support: Bilateral upper extremity supported Standing balance-Leahy Scale:  (with RW)                              ADL Overall ADL's : Needs assistance/impaired Eating/Feeding: Independent;Sitting   Grooming: Min guard;Standing   Upper Body Bathing: Set up;Sitting   Lower Body Bathing: Minimal assistance (with min guard A sit<>stand)   Upper Body Dressing : Set up;Sitting   Lower Body Dressing: Moderate assistance (with min guard A sit<>stand)   Toilet Transfer: Minimal assistance;Ambulation;Regular Toilet;Grab bars   Toileting- Clothing Manipulation and Hygiene: Min guard;Sit to/from stand               Vision Vision Assessment?: Vision  impaired- to be further tested in functional context Additional Comments: Pt mentioned to nurse that she was seeing jagged light structures in her perpheri ealier, but not now. No report of diplopia as she had on admission. Wears reading glasses only          Pertinent Vitals/Pain Pain Assessment: Faces Faces Pain Scale: Hurts little more Pain Location: back top and lateral side of LLE Pain Descriptors / Indicators: Aching;Sore;Tightness Pain Intervention(s): Monitored during session     Hand Dominance Right   Extremity/Trunk Assessment Upper Extremity Assessment Upper Extremity Assessment: Overall WFL for tasks assessed           Communication Communication Communication: No difficulties   Cognition Arousal/Alertness: Awake/alert Behavior During Therapy: Flat affect Overall Cognitive Status: Within Functional Limits for tasks assessed                                Home Living Family/patient expects to be discharged to:: Private residence Living Arrangements: Children Available Help at Discharge: Family;Available PRN/intermittently (daughter works as Education officer, museum) Type of Home: [employer] Access: Level entry     [place]: One level     Bathroom Shower/Tub: Occupational psychologist: [place]: [place] in          Prior Functioning/Environment Level of Independence: Independent  Comments: Does not drive    OT Diagnosis: Generalized weakness   OT Problem List: Decreased range of motion;Impaired balance (sitting and/or standing);Pain;Impaired vision/perception   OT Treatment/Interventions: Self-care/ADL training;DME and/or AE instruction;Therapeutic activities;Therapeutic exercise;Patient/family education;Balance training;Visual/perceptual remediation/compensation    OT Goals(Current goals can be found in the care plan section) Acute Rehab OT Goals Patient Stated Goal: daughter (to rehab) OT  Goal Formulation: With family Time For Goal Achievement: 10/02/14 Potential to Achieve Goals: Good  OT Frequency: Min 3X/week   Barriers to D/C: Decreased caregiver support             End of Session Equipment Utilized During Treatment: Gait belt;Rolling walker Nurse Communication:  (had a bowel movement)  Activity Tolerance: Patient tolerated treatment well Patient left: in chair;with call bell/phone within reach;with family/visitor present   Time: 7342-8768 OT Time Calculation (min): 50 min Charges:  OT General Charges $OT Visit: 1 Procedure OT Treatments $Self Care/Home Management : 23-37 mins  Almon Register [phone] 09/25/2014, 10:32 AM

## 2014-09-26 ENCOUNTER — Encounter (HOSPITAL_COMMUNITY): Payer: Self-pay | Admitting: *Deleted

## 2014-09-26 ENCOUNTER — Inpatient Hospital Stay (HOSPITAL_COMMUNITY)
Admission: RE | Admit: 2014-09-26 | Discharge: 2014-10-05 | DRG: 057 | Disposition: A | Discharge status: Skilled Nursing Facility | Payer: Medicare Other | Source: Intra-hospital | Attending: Physical Medicine & Rehabilitation | Admitting: Physical Medicine & Rehabilitation

## 2014-09-26 DIAGNOSIS — I6900 Unspecified sequelae of nontraumatic subarachnoid hemorrhage: Secondary | ICD-10-CM | POA: Diagnosis present

## 2014-09-26 DIAGNOSIS — I10 Essential (primary) hypertension: Secondary | ICD-10-CM | POA: Diagnosis not present

## 2014-09-26 DIAGNOSIS — Z79899 Other long term (current) drug therapy: Secondary | ICD-10-CM | POA: Diagnosis not present

## 2014-09-26 DIAGNOSIS — I609 Nontraumatic subarachnoid hemorrhage, unspecified: Secondary | ICD-10-CM | POA: Diagnosis not present

## 2014-09-26 DIAGNOSIS — R059 Cough, unspecified: Secondary | ICD-10-CM

## 2014-09-26 DIAGNOSIS — Z85038 Personal history of other malignant neoplasm of large intestine: Secondary | ICD-10-CM

## 2014-09-26 DIAGNOSIS — R531 Weakness: Secondary | ICD-10-CM | POA: Diagnosis not present

## 2014-09-26 DIAGNOSIS — D649 Anemia, unspecified: Secondary | ICD-10-CM | POA: Diagnosis not present

## 2014-09-26 DIAGNOSIS — M79609 Pain in unspecified limb: Secondary | ICD-10-CM | POA: Diagnosis not present

## 2014-09-26 DIAGNOSIS — F329 Major depressive disorder, single episode, unspecified: Secondary | ICD-10-CM | POA: Diagnosis not present

## 2014-09-26 DIAGNOSIS — I69319 Unspecified symptoms and signs involving cognitive functions following cerebral infarction: Secondary | ICD-10-CM

## 2014-09-26 DIAGNOSIS — R05 Cough: Secondary | ICD-10-CM

## 2014-09-26 DIAGNOSIS — M7062 Trochanteric bursitis, left hip: Secondary | ICD-10-CM

## 2014-09-26 MED ORDER — HYDROCODONE-ACETAMINOPHEN 5-325 MG PO TABS
1.0000 | ORAL_TABLET | ORAL | Status: DC | PRN
  Administered 2014-09-27 (×2): 1 via ORAL
  Administered 2014-09-28: 2 via ORAL
  Administered 2014-09-28 – 2014-09-30 (×6): 1 via ORAL
  Administered 2014-10-01 (×2): 2 via ORAL
  Administered 2014-10-02 (×2): 1 via ORAL
  Administered 2014-10-02: 2 via ORAL
  Administered 2014-10-02: 1 via ORAL
  Administered 2014-10-03 – 2014-10-05 (×5): 2 via ORAL
  Filled 2014-09-26: qty 1
  Filled 2014-09-26: qty 2
  Filled 2014-09-26: qty 1
  Filled 2014-09-26 (×2): qty 2
  Filled 2014-09-26 (×3): qty 1
  Filled 2014-09-26 (×3): qty 2
  Filled 2014-09-26: qty 1
  Filled 2014-09-26 (×2): qty 2
  Filled 2014-09-26: qty 1
  Filled 2014-09-26: qty 2
  Filled 2014-09-26 (×3): qty 1
  Filled 2014-09-26: qty 2

## 2014-09-26 MED ORDER — ACETAMINOPHEN 325 MG PO TABS: 650.0000 mg | ORAL_TABLET | ORAL | Status: DC | PRN

## 2014-09-26 MED ORDER — ONDANSETRON HCL 4 MG/2ML IJ SOLN: 4.0000 mg | Freq: Four times a day (QID) | INTRAMUSCULAR | Status: DC | PRN

## 2014-09-26 MED ORDER — ONDANSETRON HCL 4 MG PO TABS: 4.0000 mg | ORAL_TABLET | Freq: Four times a day (QID) | ORAL | Status: DC | PRN

## 2014-09-26 MED ORDER — DEXAMETHASONE 6 MG PO TABS
6.0000 mg | ORAL_TABLET | Freq: Four times a day (QID) | ORAL | Status: DC
  Administered 2014-09-26 – 2014-10-02 (×22): 6 mg via ORAL
  Filled 2014-09-26 (×27): qty 1

## 2014-09-26 MED ORDER — MIRTAZAPINE 15 MG PO TABS
15.0000 mg | ORAL_TABLET | Freq: Every day | ORAL | Status: DC
  Administered 2014-09-28 – 2014-10-04 (×6): 15 mg via ORAL
  Filled 2014-09-26 (×10): qty 1

## 2014-09-26 MED ORDER — DEXAMETHASONE 6 MG PO TABS
6.0000 mg | ORAL_TABLET | Freq: Four times a day (QID) | ORAL | Status: DC
  Filled 2014-09-26 (×3): qty 1

## 2014-09-26 MED ORDER — NIMODIPINE 60 MG/20ML PO SOLN
60.0000 mg | Freq: Four times a day (QID) | ORAL | Status: DC
  Administered 2014-09-26 – 2014-10-05 (×35): 60 mg via ORAL
  Filled 2014-09-26 (×38): qty 20

## 2014-09-26 MED ORDER — POLYETHYLENE GLYCOL 3350 17 G PO PACK
17.0000 g | PACK | Freq: Every day | ORAL | Status: DC
  Administered 2014-09-29 – 2014-10-05 (×6): 17 g via ORAL
  Filled 2014-09-26 (×10): qty 1

## 2014-09-26 MED ORDER — LORATADINE 10 MG PO TABS
10.0000 mg | ORAL_TABLET | Freq: Every day | ORAL | Status: DC
Start: 2014-09-27 — End: 2014-10-05
  Administered 2014-09-27 – 2014-10-05 (×9): 10 mg via ORAL
  Filled 2014-09-26 (×10): qty 1

## 2014-09-26 MED ORDER — SORBITOL 70 % SOLN: 30.0000 mL | Freq: Every day | Status: DC | PRN

## 2014-09-26 NOTE — H&P (Signed)
Physical Medicine and Rehabilitation Admission H&P    Chief complaint: Weakness  HPI: Diana Hawkins is a 81 y.o. right handed female with history of colon cancer 2010 with resection and chronic anemia. Presented 09/21/2014 upon transfer from [facility]. She lives with her daughter and was independent with straight point cane prior to admission but rather sedentary and didn't get out of the house much. Patient with acute onset of headache and altered mental status. CT of the head showed moderate subarachnoid hemorrhage. Negative for posterior communicating artery aneurysm. There is mild hydrocephalus with intraventricular hemorrhage. CT angiogram negative for AVM without evidence of vasospasm. Neurosurgery Dr. Kathyrn Sheriff with conservative care. Decadron protocol is indicated. Tolerating a regular diet. Physical and occupational therapy evaluation completed 09/24/2014 with recommendations of physical medicine rehabilitation consult. Patient was admitted for comprehensive rehabilitation program  ROS  Review of Systems  Cardiovascular:   Irregular heartbeat  Musculoskeletal: Positive for myalgias and back pain.  Neurological: Positive for headaches.  Psychiatric/Behavioral: Positive for depression.  All other systems reviewed and are negative   Past Medical History  Diagnosis Date  . Hepatitis A 01/1976  . Irregular heartbeat 02/2001    Irregular heartbeat checked  . Anemia 07/2008    extreme anemia   Past Surgical History  Procedure Laterality Date  . Colon surgery  1/10    Stage 3 Cancer  . Breast surgery Right 11/1966    Benign tumor right breast removed  . Abdominal hysterectomy  01/1968    Complete   . Oophorectomy Right 08/1964    Right Ovary Removed, benign tumor  . Cataract extraction w/ intraocular lens implant Bilateral 04/03 L eye; 06/03 R eye  . Pancreatic cyst drainage  03/2012    Benign   History reviewed. No pertinent family history. Social  History:  reports that she has never smoked. She does not have any smokeless tobacco history on file. Her alcohol and drug histories are not on file. Allergies:  Allergies  Allergen Reactions  . Codeine Other (See Comments)    "feels like I want to climb the walls"  . Reglan [Metoclopramide]    Medications Prior to Admission  Medication Sig Dispense Refill  . alendronate (FOSAMAX) 70 MG tablet Take 70 mg by mouth once a week. Take with a full glass of water on an empty stomach.    . Calcium Carbonate-Vitamin D (CALCIUM + D PO) Take 1 tablet by mouth 2 (two) times daily.    . fluticasone (FLONASE) 50 MCG/ACT nasal spray Place 2 sprays into both nostrils daily as needed for allergies or rhinitis.    Marland Kitchen loratadine (CLARITIN) 10 MG tablet Take 10 mg by mouth daily.    . mirtazapine (REMERON) 15 MG tablet Take 15 mg by mouth at bedtime.    . Multiple Vitamins-Minerals (MULTIVITAMIN WITH MINERALS) tablet Take 1 tablet by mouth 2 (two) times daily.    . Omega-3 Fatty Acids (FISH OIL PO) Take 1 capsule by mouth 2 (two) times daily.    . polyethylene glycol (MIRALAX / GLYCOLAX) packet Take 17 g by mouth daily.     . Probiotic Product (PROBIOTIC DAILY PO) Take 1 capsule by mouth daily.    . vitamin B-12 (CYANOCOBALAMIN) 1000 MCG tablet Take 1,000 mcg by mouth daily.      Home: Home Living Family/patient expects to be discharged to:: Private residence Living Arrangements: Children Available Help at Discharge: Family, Available PRN/intermittently (daughter works as Education officer, museum) Type of Home: [place] Access:  Level entry Home Layout: One level Home Equipment: Shower seat - built in   Functional History: Prior Function Level of Independence: Independent Comments: Does not drive  Functional Status:  Mobility: Bed Mobility Overal bed mobility: Needs Assistance Bed Mobility: Supine to Sit Supine to sit: Min assist General bed mobility comments: A with bring trunk up to sitting.     Transfers Overall transfer level: Needs assistance Equipment used: Rolling walker (2 wheeled) Transfers: Sit to/from Stand Sit to Stand: Min guard Stand pivot transfers: Min assist General transfer comment: cues for UE use and controlling descent to sitting.   Ambulation/Gait Ambulation/Gait assistance: Min assist Ambulation Distance (Feet): 20 Feet Assistive device: Rolling walker (2 wheeled) Gait Pattern/deviations: Step-through pattern, Decreased stride length, Trunk flexed Gait velocity: decreased General Gait Details: pt moves slowly and cautiously.  pt indicating feeling lightheaded after getting pain meds and states pain meds do this to her.      ADL: ADL Overall ADL's : Needs assistance/impaired Eating/Feeding: Independent, Sitting Grooming: Min guard, Standing Upper Body Bathing: Set up, Sitting Lower Body Bathing: Minimal assistance (with min guard A sit<>stand) Upper Body Dressing : Set up, Sitting Lower Body Dressing: Moderate assistance (with min guard A sit<>stand) Toilet Transfer: Minimal assistance, Ambulation, Regular Toilet, Grab bars Toileting- Clothing Manipulation and Hygiene: Min guard, Sit to/from stand  Cognition: Cognition Overall Cognitive Status: Within Functional Limits for tasks assessed Orientation Level: Oriented X4 Cognition Arousal/Alertness: Awake/alert Behavior During Therapy: Flat affect Overall Cognitive Status: Within Functional Limits for tasks assessed  Physical Exam: Blood pressure 151/74, pulse 86, temperature 99 F (37.2 C), temperature source Oral, resp. rate 16, height 5\' 5"  (1.651 m), weight 60.4 kg (133 lb 2.5 oz), SpO2 98 %. Physical Exam Constitutional: She appears well-developed.  Looks much younger than stated age  HENT: dentition fair, mucosa moist Head: Normocephalic.  Eyes: EOM are normal.  Neck: Normal range of motion. Neck supple. No thyromegaly present.  Cardiovascular: Normal rate and regular rhythm. no  murmur Respiratory: Effort normal and breath sounds normal. No respiratory distress.  GI: Soft. Bowel sounds are normal. She exhibits no distension.  Neurological:  Mood is a bit flat but appropriate. She is able to provide her name age and date of birth as well as follow simple commands. She has fair awareness and insight. RUE 5/5 proximal to distal. LUE nearly 4+/5 delt, tricep, bicep, HI, wrist. . RLE: 4-/5 HF, 4/5 KE and ankle. LLE: 2+ to 3- HF, 3+KE and 4 adf/apf. Decreased LT and PP in both feet to almost the knees. Mild sensory loss also at finger tips. DTR's 1+. No resting tone. Fine motor coordination fair to good in the UE's.   Skin: Skin is warm and dry   No results found for this or any previous visit (from the past 48 hour(s)). No results found.     Medical Problem List and Plan: 1. 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  DVT Prophylaxis/Anticoagulation: SCDs. Monitor for any signs of DVT 3. Pain Management: Tylenol as needed 4. Chronic anemia. Follow-up CBC 5. Neuropsych: This patient is capable of making decisions on her own behalf. 6. Skin/Wound Care: OOB, encourage adequate nutrition 7. Fluids/Electrolytes/Nutrition: eating fairly well. BMET pending for the AM 8. Mood/depression. Resume Remeron 15 mg daily at bedtime as prior to admission. Provide emotional support 9. History of colon cancer 2010 10.Hypertension.Nimodipine 60 mg QID   Post  Admission Physician Evaluation: 1. Functional deficits secondary  to nontraumatic SAH in the left>right suprasellar cisterns with extension. 2. Patient is admitted to receive collaborative, interdisciplinary care between the physiatrist, rehab nursing staff, and therapy team. 3. Patient's level of medical complexity and substantial therapy needs in context of that medical necessity  cannot be provided at a lesser intensity of care such as a SNF. 4. Patient has experienced substantial functional loss from his/her baseline which was documented above under the "Functional History" and "Functional Status" headings.  Judging by the patient's diagnosis, physical exam, and functional history, the patient has potential for functional progress which will result in measurable gains while on inpatient rehab.  These gains will be of substantial and practical use upon discharge  in facilitating mobility and self-care at the household level. 5. Physiatrist will provide 24 hour management of medical needs as well as oversight of the therapy plan/treatment and provide guidance as appropriate regarding the interaction of the two. 6. 24 hour rehab nursing will assist with bladder management, bowel management, safety, skin/wound care, disease management, medication administration, pain management and patient education  and help integrate therapy concepts, techniques,education, etc. 7. PT will assess and treat for/with: Lower extremity strength, range of motion, stamina, balance, functional mobility, safety, adaptive techniques and equipment, NMR, stroke education, ego support, visual-spatial awareness.   Goals are: mod I. 8. OT will assess and treat for/with: ADL's, functional mobility, safety, upper extremity strength, adaptive techniques and equipment, NMR, ego support, community reintegration, visual-spatial awareness.   Goals are: mod I. Therapy may proceed with showering this patient. 9. SLP will assess and treat for/with: n/a.  Goals are: n/a. 10. Case Management and Social Worker will assess and treat for psychological issues and discharge planning. 11. Team conference will be held weekly to assess progress toward goals and to determine barriers to discharge. 12. Patient will receive at least 3 hours of therapy per day at least 5 days per week. 13. ELOS: 7 days       14. Prognosis:   excellent     Meredith Staggers, MD, [facility] 09/26/2014   09/26/2014

## 2014-09-26 NOTE — Interval H&P Note (Signed)
Diana Hawkins was admitted today to Inpatient Rehabilitation with the diagnosis of SAH.  The patient's history has been reviewed, patient examined, and there is no change in status.  Patient continues to be appropriate for intensive inpatient rehabilitation.  I have reviewed the patient's chart and labs.  Questions were answered to the patient's satisfaction.  SWARTZ,ZACHARY T 09/26/2014, 6:58 PM

## 2014-09-26 NOTE — Progress Notes (Signed)
Report called to stacey on 4w. Pt transferred to 4w9. Assessment stable

## 2014-09-26 NOTE — Progress Notes (Signed)
PMR Admission Coordinator Pre-Admission Assessment  Patient: Diana Hawkins is an 81 y.o., female MRN: [id number] DOB: 1931-11-15 Height: 5\' 5"  (165.1 cm) Weight: 60.4 kg (133 lb 2.5 oz)  Insurance Information HMO: PPO: PCP: IPA: 80/20: yes OTHER: no HMO PRIMARY: Medicare a and b Policy#: [id number] Subscriber: pt Benefits: Phone #: palmetto online Name: 09/25/2014 Eff. Date: 10/03/1998 Deduct: $1288 Out of Pocket Max: none Life Max: none CIR: 100% SNF: 20 full days Outpatient: 80% Co-Pay: 20% Home Health: 100% Co-Pay: none DME: 80% Co-Pay: 20% Providers: pt choice  SECONDARY: AARP supplement Policy#: [id number] Subscriber: pt  Medicaid Application Date: Case Manager:  Disability Application Date: Case Worker:   Emergency [place]    Name Relation Home Work Mobile   [place] Daughter   [phone]     Current Medical History  Patient Admitting Diagnosis: Subarachnoid hemorrhage  History of Present Illness: Diana Hawkins is a 81 y.o. right handed female with history of colon cancer 2010 with resection and chronic anemia. Presented 09/21/2014 upon transfer from [facility]. She lives with her daughter and was independent with straight point cane prior to admission but rather sedentary and didn't get out of the house much.  Patient with acute onset of headache and altered mental status. CT of the head showed moderate subarachnoid hemorrhage. Negative for posterior communicating artery aneurysm. There is mild hydrocephalus with intraventricular hemorrhage. CT angiogram negative for AVM without evidence of vasospasm. Neurosurgery Dr. Kathyrn Sheriff with conservative care. Decadron protocol is  indicated. Tolerating a regular diet.  Total: 0 NIH    Past Medical History  Past Medical History  Diagnosis Date  . Hepatitis A 01/1976  . Irregular heartbeat 02/2001    Irregular heartbeat checked  . Anemia 07/2008    extreme anemia    Family History  family history is not on file.  Prior Rehab/Hospitalizations: none  Current Medications   Current facility-administered medications:  . acetaminophen (TYLENOL) tablet 650 mg, 650 mg, Oral, Q4H PRN, Faythe Ghee, MD, 650 mg at 09/26/14 1032 . dexamethasone (DECADRON) injection 6 mg, 6 mg, Intravenous, 4 times per day, Newman Pies, MD, 6 mg at 09/26/14 [phone] . HYDROcodone-acetaminophen (NORCO/VICODIN) 5-325 MG per tablet 1-2 tablet, 1-2 tablet, Oral, Q4H PRN, Newman Pies, MD, 1 tablet at 09/26/14 1036 . labetalol (NORMODYNE,TRANDATE) injection 10 mg, 10 mg, Intravenous, Q12H PRN, Newman Pies, MD . lactated ringers infusion, , Intravenous, Continuous, Newman Pies, MD, Last Rate: 75 mL/hr at 09/26/14 0002 . loratadine (CLARITIN) tablet 10 mg, 10 mg, Oral, Daily, Newman Pies, MD, 10 mg at 09/26/14 0917 . morphine 2 MG/ML injection 2-4 mg, 2-4 mg, Intravenous, Q4H PRN, Newman Pies, MD, 2 mg at 09/21/14 1449 . NiMODipine (NYMALIZE) 60 MG/20ML oral solution 60 mg, 60 mg, Oral, 4 times per day, Newman Pies, MD, 60 mg at 09/26/14 [phone] . ondansetron (ZOFRAN) injection 4 mg, 4 mg, Intravenous, Q6H PRN, Newman Pies, MD . polyethylene glycol (MIRALAX / GLYCOLAX) packet 17 g, 17 g, Oral, Daily, Faythe Ghee, MD, 17 g at 09/25/14 0830  Patients Current Diet: Diet regular Diet - low sodium heart healthy  Precautions / Restrictions Precautions Precautions: Fall Restrictions Weight Bearing Restrictions: No   Prior Activity Level Limited Community (1-2x/wk): does not drive since moving to [place] from [place] 2 years ago Daughter reports pt a homebody person   [place] / [place] Devices/Equipment: Radio producer (specify quad or straight) Home Equipment: Shower seat - built in  Prior Functional Level Prior  Function Level of Independence: Independent Comments: does not drive. Pt reports she uses a 3 prong cane at night due to vision getting up at night  Current Functional Level Cognition  Overall Cognitive Status: Within Functional Limits for tasks assessed Orientation Level: Oriented X4   Extremity Assessment (includes Sensation/Coordination)  Upper Extremity Assessment: Overall WFL for tasks assessed  Lower Extremity Assessment: Generalized weakness (grossly 4-/5 bilat.)    ADLs  Overall ADL's : Needs assistance/impaired Eating/Feeding: Independent, Sitting Grooming: Min guard, Standing Upper Body Bathing: Set up, Sitting Lower Body Bathing: Minimal assistance (with min guard A sit<>stand) Upper Body Dressing : Set up, Sitting Lower Body Dressing: Moderate assistance (with min guard A sit<>stand) Toilet Transfer: Minimal assistance, Ambulation, Regular Toilet, Grab bars Toileting- Clothing Manipulation and Hygiene: Min guard, Sit to/from stand    Mobility  Overal bed mobility: Needs Assistance Bed Mobility: Supine to Sit Supine to sit: Min assist General bed mobility comments: A with bring trunk up to sitting.     Transfers  Overall transfer level: Needs assistance Equipment used: Rolling walker (2 wheeled) Transfers: Sit to/from Stand Sit to Stand: Min guard Stand pivot transfers: Min assist General transfer comment: cues for UE use and controlling descent to sitting.     Ambulation / Gait / Stairs / Wheelchair Mobility  Ambulation/Gait Ambulation/Gait assistance: Museum/gallery curator (Feet): 20 Feet Assistive device: Rolling walker (2 wheeled) Gait Pattern/deviations: Step-through pattern, Decreased stride length, Trunk flexed Gait velocity: decreased General Gait Details: pt moves  slowly and cautiously. pt indicating feeling lightheaded after getting pain meds and states pain meds do this to her.     Posture / Balance Balance Overall balance assessment: Needs assistance Sitting-balance support: No upper extremity supported, Feet supported Sitting balance-Leahy Scale: Good Standing balance support: Single extremity supported, During functional activity Standing balance-Leahy Scale: Poor Standing balance comment: pt needs single UE to maintain balance in standing. pt able to stand for ~4-65mins during peri hygiene, but needed total care for peri hygiene.     Special needs/care consideration   Bowel mgmt: BM 09/26/14 continent Bladder mgmt: continent   Previous Home Environment Living Arrangements: Children, Other (Comment) (daughter) Lives With: Daughter Available Help at Discharge: Available PRN/intermittently (daughter teaches school during the day) Type of Home: House Home Layout: One level Home Access: Level entry Bathroom Shower/Tub: Walk-in shower, Other (comment) (built in Doctor, hospital) Bathroom Toilet: Standard Bathroom Accessibility: Yes How Accessible: Other (comment) (via cane. Pt states she likely needs rails in bathroom as sF) Home Care Services: No Additional Comments: pt moved from [facility] yrs ago to be with daughter  Discharge Living Setting Plans for Discharge Living Setting: Lives with (comment), Other (Comment) (daughter) Type of Home at Discharge: House Discharge Home Layout: One level Discharge Home Access: Level entry Discharge Bathroom Shower/Tub: Walk-in shower, Other (comment) (built in Paediatric nurse) Discharge Bathroom Toilet: Standard Discharge Bathroom Accessibility: Yes How Accessible: Other (comment) (via cane) Does the patient have any problems obtaining your medications?: No  Social/Family/Support Systems Patient Roles: Parent Contact Information: Cheri Rous, daughter Anticipated  Caregiver: daughter when not working as a Producer, television/film/video Information: see above Ability/Limitations of Caregiver: works Careers adviser: Evenings only Discharge Plan Discussed with Primary Caregiver: Yes Is Caregiver In Agreement with Plan?: Yes Does Caregiver/Family have Issues with Lodging/Transportation while Pt is in Rehab?: No  Goals/Additional Needs Patient/Family Goal for Rehab: Mod I with PT and OT Expected length of stay: ELOS 10 to 14 days Special Service  Needs: Pt reports vision issues on admission which mostly resolved now. Did report vision issues pta such as when getting up at night Pt/Family Agrees to Admission and willing to participate: Yes Program Orientation Provided & Reviewed with Pt/Caregiver Including Roles & Responsibilities: Yes  Decrease burden of Care through IP rehab admission: n/a  Possible need for SNF placement upon discharge:not anticipated  Patient Condition: This patient's condition remains as documented in the consult dated 09/25/2014, in which the Rehabilitation Physician determined and documented that the patient's condition is appropriate for intensive rehabilitative care in an inpatient rehabilitation facility. Will admit to inpatient rehab today.  Preadmission Screen Completed By: Cleatrice Burke, 09/26/2014 11:09 AM ______________________________________________________________________  Discussed status with Dr. Naaman Plummer on 09/26/2014 at 40 and received telephone approval for admission today.  Admission Coordinator: Cleatrice Burke, time 1106 Date 09/26/2014.          Cosigned by: Meredith Staggers, MD at 09/26/2014 11:26 AM  Revision History     Date/Time User Provider Type Action   09/26/2014 11:26 AM Meredith Staggers, MD Physician Cosign   09/26/2014 11:10 AM Cleatrice Burke, RN Rehab Admission Coordinator Sign

## 2014-09-26 NOTE — Progress Notes (Signed)
Physical Medicine and Rehabilitation Consult Reason for Consult: Nontraumatic subarachnoid hemorrhage Referring Physician: Dr. Kathyrn Sheriff   HPI: Diana Hawkins is a 81 y.o. right handed female with history of colon cancer 2010 with resection and chronic anemia. Presented 09/21/2014 upon transfer from [facility]. She lives with her daughter and was independent with straight point cane prior to admission but rather sedentary and didn't get out of the house much. Patient with acute onset of headache and altered mental status. CT of the head showed moderate subarachnoid hemorrhage. Negative for posterior communicating artery aneurysm. There is mild hydrocephalus with intraventricular hemorrhage. CT angiogram negative for AVM without evidence of vasospasm. Neurosurgery Dr. Kathyrn Sheriff with conservative care. Decadron protocol is indicated. Tolerating a regular diet. Physical occupational therapy evaluation completed 09/24/2014 with recommendations of physical medicine rehabilitation consult.   Review of Systems  Cardiovascular:   Irregular heartbeat  Musculoskeletal: Positive for myalgias and back pain.  Neurological: Positive for headaches.  Psychiatric/Behavioral: Positive for depression.  All other systems reviewed and are negative.  Past Medical History  Diagnosis Date  . Hepatitis A 01/1976  . Irregular heartbeat 02/2001    Irregular heartbeat checked  . Anemia 07/2008    extreme anemia   Past Surgical History  Procedure Laterality Date  . Colon surgery  1/10    Stage 3 Cancer  . Breast surgery Right 11/1966    Benign tumor right breast removed  . Abdominal hysterectomy  01/1968    Complete   . Oophorectomy Right 08/1964    Right Ovary Removed, benign tumor  . Cataract extraction w/ intraocular lens implant Bilateral 04/03 L eye; 06/03 R eye  . Pancreatic cyst drainage  03/2012    Benign   History reviewed.  No pertinent family history. Social History:  has no tobacco, alcohol, and drug history on file. Allergies:  Allergies  Allergen Reactions  . Codeine Other (See Comments)    "feels like I want to climb the walls"  . Reglan [Metoclopramide]    Medications Prior to Admission  Medication Sig Dispense Refill  . alendronate (FOSAMAX) 70 MG tablet Take 70 mg by mouth once a week. Take with a full glass of water on an empty stomach.    . Calcium Carbonate-Vitamin D (CALCIUM + D PO) Take 1 tablet by mouth 2 (two) times daily.    . fluticasone (FLONASE) 50 MCG/ACT nasal spray Place 2 sprays into both nostrils daily as needed for allergies or rhinitis.    Marland Kitchen loratadine (CLARITIN) 10 MG tablet Take 10 mg by mouth daily.    . mirtazapine (REMERON) 15 MG tablet Take 15 mg by mouth at bedtime.    . Multiple Vitamins-Minerals (MULTIVITAMIN WITH MINERALS) tablet Take 1 tablet by mouth 2 (two) times daily.    . Omega-3 Fatty Acids (FISH OIL PO) Take 1 capsule by mouth 2 (two) times daily.    . polyethylene glycol (MIRALAX / GLYCOLAX) packet Take 17 g by mouth daily.     . Probiotic Product (PROBIOTIC DAILY PO) Take 1 capsule by mouth daily.    . vitamin B-12 (CYANOCOBALAMIN) 1000 MCG tablet Take 1,000 mcg by mouth daily.      Home: Home Living Family/patient expects to be discharged to:: Private residence Living Arrangements: Children Available Help at Discharge: Family, Available PRN/intermittently (daughter works as Education officer, museum) Type of Home: [place] Access: Level entry [place]: [place]: [place] in  Functional History: Prior Function Level of Independence: Independent Comments: Does  not drive Functional Status:  Mobility: Bed Mobility General bed mobility comments: Pt up in recliner upon arrival Transfers Overall transfer level: Needs assistance Equipment used: Rolling walker (2  wheeled) Transfers: Sit to/from Stand Sit to Stand: Min guard Stand pivot transfers: Min assist General transfer comment: verbal cues for sequencing Ambulation/Gait Ambulation/Gait assistance: Min assist Ambulation Distance (Feet): 25 Feet Assistive device: 1 person hand held assist Gait Pattern/deviations: Step-through pattern, Decreased stride length Gait velocity: decreased General Gait Details: slow, purposeful steps    ADL: ADL Overall ADL's : Needs assistance/impaired Eating/Feeding: Independent, Sitting Grooming: Min guard, Standing Upper Body Bathing: Set up, Sitting Lower Body Bathing: Minimal assistance (with min guard A sit<>stand) Upper Body Dressing : Set up, Sitting Lower Body Dressing: Moderate assistance (with min guard A sit<>stand) Toilet Transfer: Minimal assistance, Ambulation, Regular Toilet, Grab bars Toileting- Clothing Manipulation and Hygiene: Min guard, Sit to/from stand  Cognition: Cognition Overall Cognitive Status: Within Functional Limits for tasks assessed Orientation Level: Oriented X4 Cognition Arousal/Alertness: Awake/alert Behavior During Therapy: Flat affect Overall Cognitive Status: Within Functional Limits for tasks assessed  Blood pressure 136/72, pulse 89, temperature 98.1 F (36.7 C), temperature source Oral, resp. rate 17, height 5\' 5"  (1.651 m), weight 60.4 kg (133 lb 2.5 oz), SpO2 100 %. Physical Exam  Constitutional: She appears well-developed.  Looks much younger than stated age  HENT:  Head: Normocephalic.  Eyes: EOM are normal.  Neck: Normal range of motion. Neck supple. No thyromegaly present.  Cardiovascular: Normal rate and regular rhythm.  Respiratory: Effort normal and breath sounds normal. No respiratory distress.  GI: Soft. Bowel sounds are normal. She exhibits no distension.  Neurological:  Mood is a bit flat but appropriate. She does make eye contact with examiner. She is able to provide her name age and date  of birth as well as follow simple commands. She has fair awareness and insight. RUE 5/5. LUE nearly 4 to 4+/5. RLE: 4-/5 HF, 4/5 KE and ankle. LLE: 3- HF, 4- KE and 4 adf/apf. Decreased LT and PP in both feet.  Skin: Skin is warm and dry.     Lab Results Last 24 Hours    No results found for this or any previous visit (from the past 24 hour(s)).    Imaging Results (Last 48 hours)    No results found.    Assessment/Plan: Diagnosis: Subarachnoid hemorrhage centered in the left suprasellar cistern extending into the left perimesencephalic cistern and left sylvian fissure 1. Does the need for close, 24 hr/day medical supervision in concert with the patient's rehab needs make it unreasonable for this patient to be served in a less intensive setting? Yes 2. Co-Morbidities requiring supervision/potential complications: htn 3. Due to bladder management, bowel management, safety, skin/wound care, disease management, medication administration, pain management and patient education, does the patient require 24 hr/day rehab nursing? Yes 4. Does the patient require coordinated care of a physician, rehab nurse, PT (1-2 hrs/day, 5 days/week) and OT (1-2 hrs/day, 5 days/week) to address physical and functional deficits in the context of the above medical diagnosis(es)? Yes Addressing deficits in the following areas: balance, endurance, locomotion, strength, transferring, bowel/bladder control, bathing, dressing, feeding, grooming, toileting and psychosocial support 5. Can the patient actively participate in an intensive therapy program of at least 3 hrs of therapy per day at least 5 days per week? Yes 6. The potential for patient to make measurable gains while on inpatient rehab is excellent 7. Anticipated functional outcomes upon discharge from inpatient rehab  are modified independent with PT, modified independent with OT, n/a with SLP. 8. Estimated rehab length of stay to reach the above functional goals  is: 10-14 days 9. Does the patient have adequate social supports and living environment to accommodate these discharge functional goals? Yes 10. Anticipated D/C setting: Home 11. Anticipated post D/C treatments: HH therapy and Outpatient therapy 12. Overall Rehab/Functional Prognosis: excellent  RECOMMENDATIONS: This patient's condition is appropriate for continued rehabilitative care in the following setting: CIR Patient has agreed to participate in recommended program. Yes Note that insurance prior authorization may be required for reimbursement for recommended care.  Comment: Rehab Admissions Coordinator to follow up.  Thanks,  Meredith Staggers, MD, Mellody Drown     09/25/2014       Revision History     Date/Time User Provider Type Action   09/25/2014 2:59 PM Meredith Staggers, MD Physician Sign   09/25/2014 11:17 AM Cathlyn Parsons, PA-C Physician Assistant Pend   View Details Report       Routing History     Date/Time From To Method   09/25/2014 2:59 PM Meredith Staggers, MD Meredith Staggers, MD In Basket

## 2014-09-26 NOTE — Progress Notes (Signed)
Inpt rehab bed is available today. Pt is in agreement to admission. I have discussed with Dr. Arnoldo Morale and I will arrange for today. [phone]

## 2014-09-26 NOTE — PMR Pre-admission (Signed)
PMR Admission Coordinator Pre-Admission Assessment  Patient: Angeliah Hawkins is an 81 y.o., female MRN: [id number] DOB: 08-Mar-1932 Height: 5\' 5"  (165.1 cm) Weight: 60.4 kg (133 lb 2.5 oz)              Insurance Information HMO:     PPO:      PCP:      IPA:      80/20: yes     OTHER: no HMO PRIMARY: Medicare a and b      Policy#: [id number]      Subscriber: pt Benefits:  Phone #: [email or web address]     Name: 09/25/2014 Eff. Date: 10/03/1998     Deduct: $1288      Out of Pocket Max: none      Life Max: none CIR: 100%      SNF: 20 full days Outpatient: 80%     Co-Pay: 20% Home Health: 100%      Co-Pay: none DME: 80%     Co-Pay: 20% Providers: pt choice  SECONDARY: AARP supplement      Policy#: [id number]      Subscriber: pt  Medicaid Application Date:       Case Manager:  Disability Application Date:       Case Worker:   Emergency [place]    Name Relation Home Work Mobile   [place] Daughter   [phone]     Current Medical History  Patient Admitting Diagnosis: Subarachnoid hemorrhage  History of Present Illness: Diana Hawkins is a 81 y.o. right handed female with history of colon cancer 2010 with resection and chronic anemia. Presented 09/21/2014 upon transfer from [facility]. She lives with her daughter and was independent with straight point cane prior to admission but rather sedentary and didn't get out of the house much.   Patient with acute onset of headache and altered mental status. CT of the head showed moderate subarachnoid hemorrhage. Negative for posterior communicating artery aneurysm. There is mild hydrocephalus with intraventricular hemorrhage. CT angiogram negative for AVM without evidence of vasospasm. Neurosurgery Dr. Kathyrn Sheriff with conservative care. Decadron protocol is indicated. Tolerating a regular diet.   Total: 0 NIH    Past Medical History  Past Medical History  Diagnosis Date  . Hepatitis A 01/1976  .  Irregular heartbeat 02/2001    Irregular heartbeat checked  . Anemia 07/2008    extreme anemia    Family History  family history is not on file.  Prior Rehab/Hospitalizations: none  Current Medications   Current facility-administered medications:  .  acetaminophen (TYLENOL) tablet 650 mg, 650 mg, Oral, Q4H PRN, Faythe Ghee, MD, 650 mg at 09/26/14 1032 .  dexamethasone (DECADRON) injection 6 mg, 6 mg, Intravenous, 4 times per day, Newman Pies, MD, 6 mg at 09/26/14 [phone] .  HYDROcodone-acetaminophen (NORCO/VICODIN) 5-325 MG per tablet 1-2 tablet, 1-2 tablet, Oral, Q4H PRN, Newman Pies, MD, 1 tablet at 09/26/14 1036 .  labetalol (NORMODYNE,TRANDATE) injection 10 mg, 10 mg, Intravenous, Q12H PRN, Newman Pies, MD .  lactated ringers infusion, , Intravenous, Continuous, Newman Pies, MD, Last Rate: 75 mL/hr at 09/26/14 0002 .  loratadine (CLARITIN) tablet 10 mg, 10 mg, Oral, Daily, Newman Pies, MD, 10 mg at 09/26/14 0917 .  morphine 2 MG/ML injection 2-4 mg, 2-4 mg, Intravenous, Q4H PRN, Newman Pies, MD, 2 mg at 09/21/14 1449 .  NiMODipine (NYMALIZE) 60 MG/20ML oral solution 60 mg, 60 mg, Oral, 4 times per day, Newman Pies, MD, 60 mg at 09/26/14 [phone] .  ondansetron (ZOFRAN) injection 4 mg, 4 mg, Intravenous, Q6H PRN, Newman Pies, MD .  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 Pt reports she uses a 3 prong cane at night due to vision getting up at night  Current Functional Level Cognition  Overall Cognitive Status: Within Functional Limits for tasks assessed Orientation Level: Oriented X4    Extremity Assessment (includes Sensation/Coordination)  Upper Extremity Assessment: Overall WFL for tasks assessed  Lower Extremity Assessment: Generalized weakness (grossly 4-/5 bilat.)    ADLs  Overall ADL's : Needs assistance/impaired Eating/Feeding: Independent, Sitting Grooming: Min guard, Standing Upper Body Bathing: Set up, Sitting Lower Body Bathing: Minimal assistance (with min guard A sit<>stand) Upper Body Dressing : Set up, Sitting Lower Body Dressing: Moderate assistance (with min guard A sit<>stand) Toilet Transfer: Minimal assistance, Ambulation, Regular Toilet, Grab bars Toileting- Clothing Manipulation and Hygiene: Min guard, Sit to/from stand    Mobility  Overal bed mobility: Needs Assistance Bed Mobility: Supine to Sit Supine to sit: Min assist General bed mobility comments: A with bring trunk up to sitting.      Transfers  Overall transfer level: Needs assistance Equipment used: Rolling walker (2 wheeled) Transfers: Sit to/from Stand Sit to Stand: Min guard Stand pivot transfers: Min assist General transfer comment: cues for UE use and controlling descent to sitting.      Ambulation / Gait / Stairs / Wheelchair Mobility  Ambulation/Gait Ambulation/Gait assistance: Museum/gallery curator (Feet): 20 Feet Assistive device: Rolling walker (2 wheeled) Gait Pattern/deviations: Step-through pattern, Decreased stride length, Trunk flexed Gait velocity: decreased General Gait Details: pt moves slowly and cautiously.  pt indicating feeling lightheaded after getting pain meds and states pain meds do this to her.      Posture / Balance Balance Overall balance  assessment: Needs assistance Sitting-balance support: No upper extremity supported, Feet supported Sitting balance-Leahy Scale: Good Standing balance support: Single extremity supported, During functional activity Standing balance-Leahy Scale: Poor Standing balance comment: pt needs single UE to maintain balance in standing.  pt able to stand for ~4-65mins during peri hygiene, but needed total care for peri hygiene.      Special needs/care consideration                             Bowel mgmt: BM 09/26/14 continent Bladder mgmt: continent   Previous Home Environment Living Arrangements: Children, Other (Comment) (daughter)  Lives With: Daughter Available Help at Discharge: Available PRN/intermittently (daughter teaches school during the day) Type of Home: House Home Layout: One level Home Access: Level entry Bathroom Shower/Tub: Walk-in shower, Other (comment) (built in Doctor, hospital) Bathroom Toilet: Standard Bathroom Accessibility: Yes How Accessible: Other (comment) (via cane. Pt states she likely needs rails in bathroom as sF) Home Care Services: No Additional Comments: pt moved from [facility] 2 yrs ago to be with daughter  Discharge Living Setting  Plans for Discharge Living Setting: Lives with (comment), Other (Comment) (daughter) Type of Home at Discharge: House Discharge Home Layout: One level Discharge Home Access: Level entry Discharge Bathroom Shower/Tub: Walk-in shower, Other (comment) (built in Paediatric nurse) Discharge Bathroom Toilet: Standard Discharge Bathroom Accessibility: Yes How Accessible: Other (comment) (via cane) Does the patient have any problems obtaining your medications?: No  Social/Family/Support Systems Patient Roles: Parent Contact Information: Cheri Rous, daughter Anticipated Caregiver: daughter when not working as a Producer, television/film/video Information: see above Ability/Limitations of Caregiver: works Careers adviser:  Evenings only Discharge Plan Discussed with Primary Caregiver: Yes Is Caregiver In Agreement with Plan?: Yes Does Caregiver/Family have Issues with Lodging/Transportation while Pt is in Rehab?: No  Goals/Additional Needs Patient/Family Goal for Rehab: Mod I with PT and OT Expected length of stay: ELOS 10 to 14 days Special Service Needs: Pt reports vision issues on admission which mostly resolved now. Did report vision issues pta such as when getting up at night Pt/Family Agrees to Admission and willing to participate: Yes Program Orientation Provided & Reviewed with Pt/Caregiver Including Roles  & Responsibilities: Yes  Decrease burden of Care through IP rehab admission: n/a  Possible need for SNF placement upon discharge:not anticipated  Patient Condition: This patient's condition remains as documented in the consult dated 09/25/2014, in which the Rehabilitation Physician determined and documented that the patient's condition is appropriate for intensive rehabilitative care in an inpatient rehabilitation facility. Will admit to inpatient rehab today.  Preadmission Screen Completed By:  Cleatrice Burke, 09/26/2014 11:09 AM ______________________________________________________________________   Discussed status with Dr. Naaman Plummer on 09/26/2014 at  48 and received telephone approval for admission today.  Admission Coordinator:  Cleatrice Burke, time 1106 Date 09/26/2014.

## 2014-09-26 NOTE — Discharge Summary (Signed)
Physician Discharge Summary  [name] MRN: [id number] DOB/AGE: 1932-02-09 81 y.o.  Admit date: 09/21/2014 Discharge date: 09/26/2014  Admission Diagnoses: Nontraumatic subarachnoid hemorrhage, hydrocephalus, headache  Discharge Diagnoses: The same Active Problems:   Subarachnoid hemorrhage   Discharged Condition: good  Hospital Course: The patient was admitted on 09/21/2014 with a diagnosis of a nontraumatic subarachnoid hemorrhage. She was initially worked up with a CT angiogram which was negative. This was followed with a cerebral arteriogram which also was negative for aneurysm, AVM, etc.  The patient's hospital course was unremarkable. She was somewhat slow to mobilize. She was seen by PT OT and rehabilitation. She was stable for transfer to the rehabilitation unit on 09/26/2014. She was given oral and written discharge instructions. She should follow-up with Dr. Kathyrn Sheriff in a few weeks in the office.  Consults: PT, OT, rehabilitation Significant Diagnostic Studies: CT angiogram, cerebral arteriogram Treatments: Observation Discharge Exam: Blood pressure 157/72, pulse 80, temperature 98.2 F (36.8 C), temperature source Oral, resp. rate 16, height 5\' 5"  (1.651 m), weight 60.4 kg (133 lb 2.5 oz), SpO2 100 %. The patient is alert and oriented 3. Her strength is normal. Her speech is normal. Her headache is better.  Disposition: Rehabilitation  Discharge Instructions    Call MD for:  difficulty breathing, headache or visual disturbances    Complete by:  As directed      Call MD for:  extreme fatigue    Complete by:  As directed      Call MD for:  hives    Complete by:  As directed      Call MD for:  persistant dizziness or light-headedness    Complete by:  As directed      Call MD for:  persistant nausea and vomiting    Complete by:  As directed      Call MD for:  redness, tenderness, or signs of infection (pain, swelling, redness, odor or green/yellow  discharge around incision site)    Complete by:  As directed      Call MD for:  severe uncontrolled pain    Complete by:  As directed      Call MD for:  temperature >100.4    Complete by:  As directed      Diet - low sodium heart healthy    Complete by:  As directed      Discharge instructions    Complete by:  As directed   Call [phone] for a followup appointment. Take a stool softener while you are using pain medications.     Driving Restrictions    Complete by:  As directed   Do not drive for 2 weeks.     Increase activity slowly    Complete by:  As directed      Lifting restrictions    Complete by:  As directed   Do not lift more than 5 pounds. No excessive bending or twisting.     May shower / Bathe    Complete by:  As directed   He may shower after the pain she is removed 3 days after surgery. Leave the incision alone.     Remove dressing in 48 hours    Complete by:  As directed   Your stitches are under the scan and will dissolve by themselves. The Steri-Strips will fall off after you take a few showers. Do not rub back or pick at the wound, Leave the wound alone.  Medication List    TAKE these medications        alendronate 70 MG tablet  Commonly known as:  FOSAMAX  Take 70 mg by mouth once a week. Take with a full glass of water on an empty stomach.     CALCIUM + D PO  Take 1 tablet by mouth 2 (two) times daily.     FISH OIL PO  Take 1 capsule by mouth 2 (two) times daily.     fluticasone 50 MCG/ACT nasal spray  Commonly known as:  FLONASE  Place 2 sprays into both nostrils daily as needed for allergies or rhinitis.     loratadine 10 MG tablet  Commonly known as:  CLARITIN  Take 10 mg by mouth daily.     mirtazapine 15 MG tablet  Commonly known as:  REMERON  Take 15 mg by mouth at bedtime.     multivitamin with minerals tablet  Take 1 tablet by mouth 2 (two) times daily.     polyethylene glycol packet  Commonly known as:  MIRALAX /  GLYCOLAX  Take 17 g by mouth daily.     PROBIOTIC DAILY PO  Take 1 capsule by mouth daily.     vitamin B-12 1000 MCG tablet  Commonly known as:  CYANOCOBALAMIN  Take 1,000 mcg by mouth daily.         SignedOphelia Charter 09/26/2014, 7:52 AM

## 2014-09-26 NOTE — H&P (View-Only) (Signed)
Physical Medicine and Rehabilitation Admission H&P    Chief complaint: Weakness  HPI: Diana Hawkins is a 81 y.o. right handed female with history of colon cancer 2010 with resection and chronic anemia. Presented 09/21/2014 upon transfer from [facility]. She lives with her daughter and was independent with straight point cane prior to admission but rather sedentary and didn't get out of the house much. Patient with acute onset of headache and altered mental status. CT of the head showed moderate subarachnoid hemorrhage. Negative for posterior communicating artery aneurysm. There is mild hydrocephalus with intraventricular hemorrhage. CT angiogram negative for AVM without evidence of vasospasm. Neurosurgery Dr. Kathyrn Sheriff with conservative care. Decadron protocol is indicated. Tolerating a regular diet. Physical and occupational therapy evaluation completed 09/24/2014 with recommendations of physical medicine rehabilitation consult. Patient was admitted for comprehensive rehabilitation program  ROS  Review of Systems  Cardiovascular:   Irregular heartbeat  Musculoskeletal: Positive for myalgias and back pain.  Neurological: Positive for headaches.  Psychiatric/Behavioral: Positive for depression.  All other systems reviewed and are negative   Past Medical History  Diagnosis Date  . Hepatitis A 01/1976  . Irregular heartbeat 02/2001    Irregular heartbeat checked  . Anemia 07/2008    extreme anemia   Past Surgical History  Procedure Laterality Date  . Colon surgery  1/10    Stage 3 Cancer  . Breast surgery Right 11/1966    Benign tumor right breast removed  . Abdominal hysterectomy  01/1968    Complete   . Oophorectomy Right 08/1964    Right Ovary Removed, benign tumor  . Cataract extraction w/ intraocular lens implant Bilateral 04/03 L eye; 06/03 R eye  . Pancreatic cyst drainage  03/2012    Benign   History reviewed. No pertinent family history. Social  History:  reports that she has never smoked. She does not have any smokeless tobacco history on file. Her alcohol and drug histories are not on file. Allergies:  Allergies  Allergen Reactions  . Codeine Other (See Comments)    "feels like I want to climb the walls"  . Reglan [Metoclopramide]    Medications Prior to Admission  Medication Sig Dispense Refill  . alendronate (FOSAMAX) 70 MG tablet Take 70 mg by mouth once a week. Take with a full glass of water on an empty stomach.    . Calcium Carbonate-Vitamin D (CALCIUM + D PO) Take 1 tablet by mouth 2 (two) times daily.    . fluticasone (FLONASE) 50 MCG/ACT nasal spray Place 2 sprays into both nostrils daily as needed for allergies or rhinitis.    Marland Kitchen loratadine (CLARITIN) 10 MG tablet Take 10 mg by mouth daily.    . mirtazapine (REMERON) 15 MG tablet Take 15 mg by mouth at bedtime.    . Multiple Vitamins-Minerals (MULTIVITAMIN WITH MINERALS) tablet Take 1 tablet by mouth 2 (two) times daily.    . Omega-3 Fatty Acids (FISH OIL PO) Take 1 capsule by mouth 2 (two) times daily.    . polyethylene glycol (MIRALAX / GLYCOLAX) packet Take 17 g by mouth daily.     . Probiotic Product (PROBIOTIC DAILY PO) Take 1 capsule by mouth daily.    . vitamin B-12 (CYANOCOBALAMIN) 1000 MCG tablet Take 1,000 mcg by mouth daily.      Home: Home Living Family/patient expects to be discharged to:: Private residence Living Arrangements: Children Available Help at Discharge: Family, Available PRN/intermittently (daughter works as Education officer, museum) Type of Home: [place] Access:  Level entry Home Layout: One level Home Equipment: Shower seat - built in   Functional History: Prior Function Level of Independence: Independent Comments: Does not drive  Functional Status:  Mobility: Bed Mobility Overal bed mobility: Needs Assistance Bed Mobility: Supine to Sit Supine to sit: Min assist General bed mobility comments: A with bring trunk up to sitting.     Transfers Overall transfer level: Needs assistance Equipment used: Rolling walker (2 wheeled) Transfers: Sit to/from Stand Sit to Stand: Min guard Stand pivot transfers: Min assist General transfer comment: cues for UE use and controlling descent to sitting.   Ambulation/Gait Ambulation/Gait assistance: Min assist Ambulation Distance (Feet): 20 Feet Assistive device: Rolling walker (2 wheeled) Gait Pattern/deviations: Step-through pattern, Decreased stride length, Trunk flexed Gait velocity: decreased General Gait Details: pt moves slowly and cautiously.  pt indicating feeling lightheaded after getting pain meds and states pain meds do this to her.      ADL: ADL Overall ADL's : Needs assistance/impaired Eating/Feeding: Independent, Sitting Grooming: Min guard, Standing Upper Body Bathing: Set up, Sitting Lower Body Bathing: Minimal assistance (with min guard A sit<>stand) Upper Body Dressing : Set up, Sitting Lower Body Dressing: Moderate assistance (with min guard A sit<>stand) Toilet Transfer: Minimal assistance, Ambulation, Regular Toilet, Grab bars Toileting- Clothing Manipulation and Hygiene: Min guard, Sit to/from stand  Cognition: Cognition Overall Cognitive Status: Within Functional Limits for tasks assessed Orientation Level: Oriented X4 Cognition Arousal/Alertness: Awake/alert Behavior During Therapy: Flat affect Overall Cognitive Status: Within Functional Limits for tasks assessed  Physical Exam: Blood pressure 151/74, pulse 86, temperature 99 F (37.2 C), temperature source Oral, resp. rate 16, height 5\' 5"  (1.651 m), weight 60.4 kg (133 lb 2.5 oz), SpO2 98 %. Physical Exam Constitutional: She appears well-developed.  Looks much younger than stated age  HENT: dentition fair, mucosa moist Head: Normocephalic.  Eyes: EOM are normal.  Neck: Normal range of motion. Neck supple. No thyromegaly present.  Cardiovascular: Normal rate and regular rhythm. no  murmur Respiratory: Effort normal and breath sounds normal. No respiratory distress.  GI: Soft. Bowel sounds are normal. She exhibits no distension.  Neurological:  Mood is a bit flat but appropriate. She is able to provide her name age and date of birth as well as follow simple commands. She has fair awareness and insight. RUE 5/5 proximal to distal. LUE nearly 4+/5 delt, tricep, bicep, HI, wrist. . RLE: 4-/5 HF, 4/5 KE and ankle. LLE: 2+ to 3- HF, 3+KE and 4 adf/apf. Decreased LT and PP in both feet to almost the knees. Mild sensory loss also at finger tips. DTR's 1+. No resting tone. Fine motor coordination fair to good in the UE's.   Skin: Skin is warm and dry   No results found for this or any previous visit (from the past 48 hour(s)). No results found.     Medical Problem List and Plan: 1. 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  DVT Prophylaxis/Anticoagulation: SCDs. Monitor for any signs of DVT 3. Pain Management: Tylenol as needed 4. Chronic anemia. Follow-up CBC 5. Neuropsych: This patient is capable of making decisions on her own behalf. 6. Skin/Wound Care: OOB, encourage adequate nutrition 7. Fluids/Electrolytes/Nutrition: eating fairly well. BMET pending for the AM 8. Mood/depression. Resume Remeron 15 mg daily at bedtime as prior to admission. Provide emotional support 9. History of colon cancer 2010 10.Hypertension.Nimodipine 60 mg QID   Post  Admission Physician Evaluation: 1. Functional deficits secondary  to nontraumatic SAH in the left>right suprasellar cisterns with extension. 2. Patient is admitted to receive collaborative, interdisciplinary care between the physiatrist, rehab nursing staff, and therapy team. 3. Patient's level of medical complexity and substantial therapy needs in context of that medical necessity  cannot be provided at a lesser intensity of care such as a SNF. 4. Patient has experienced substantial functional loss from his/her baseline which was documented above under the "Functional History" and "Functional Status" headings.  Judging by the patient's diagnosis, physical exam, and functional history, the patient has potential for functional progress which will result in measurable gains while on inpatient rehab.  These gains will be of substantial and practical use upon discharge  in facilitating mobility and self-care at the household level. 5. Physiatrist will provide 24 hour management of medical needs as well as oversight of the therapy plan/treatment and provide guidance as appropriate regarding the interaction of the two. 6. 24 hour rehab nursing will assist with bladder management, bowel management, safety, skin/wound care, disease management, medication administration, pain management and patient education  and help integrate therapy concepts, techniques,education, etc. 7. PT will assess and treat for/with: Lower extremity strength, range of motion, stamina, balance, functional mobility, safety, adaptive techniques and equipment, NMR, stroke education, ego support, visual-spatial awareness.   Goals are: mod I. 8. OT will assess and treat for/with: ADL's, functional mobility, safety, upper extremity strength, adaptive techniques and equipment, NMR, ego support, community reintegration, visual-spatial awareness.   Goals are: mod I. Therapy may proceed with showering this patient. 9. SLP will assess and treat for/with: n/a.  Goals are: n/a. 10. Case Management and Social Worker will assess and treat for psychological issues and discharge planning. 11. Team conference will be held weekly to assess progress toward goals and to determine barriers to discharge. 12. Patient will receive at least 3 hours of therapy per day at least 5 days per week. 13. ELOS: 7 days       14. Prognosis:   excellent     Meredith Staggers, MD, [place] [facility] 09/26/2014   09/26/2014

## 2014-09-27 ENCOUNTER — Inpatient Hospital Stay (HOSPITAL_COMMUNITY): Payer: Medicare Other | Admitting: Occupational Therapy

## 2014-09-27 ENCOUNTER — Inpatient Hospital Stay (HOSPITAL_COMMUNITY): Payer: PRIVATE HEALTH INSURANCE | Admitting: Physical Therapy

## 2014-09-27 LAB — CBC WITH DIFFERENTIAL/PLATELET
BASOS ABS: 0 10*3/uL (ref 0.0–0.1)
BASOS PCT: 0 % (ref 0–1)
EOS ABS: 0 10*3/uL (ref 0.0–0.7)
Eosinophils Relative: 0 % (ref 0–5)
HCT: 43.8 % (ref 36.0–46.0)
HEMOGLOBIN: 14.7 g/dL (ref 12.0–15.0)
Lymphocytes Relative: 7 % — ABNORMAL LOW (ref 12–46)
Lymphs Abs: 1 10*3/uL (ref 0.7–4.0)
MCH: 28.5 pg (ref 26.0–34.0)
MCHC: 33.6 g/dL (ref 30.0–36.0)
MCV: 84.9 fL (ref 78.0–100.0)
Monocytes Absolute: 1 10*3/uL (ref 0.1–1.0)
Monocytes Relative: 7 % (ref 3–12)
NEUTROS ABS: 12.3 10*3/uL — AB (ref 1.7–7.7)
NEUTROS PCT: 86 % — AB (ref 43–77)
PLATELETS: 325 10*3/uL (ref 150–400)
RBC: 5.16 MIL/uL — ABNORMAL HIGH (ref 3.87–5.11)
RDW: 14.3 % (ref 11.5–15.5)
WBC: 14.3 10*3/uL — ABNORMAL HIGH (ref 4.0–10.5)

## 2014-09-27 LAB — COMPREHENSIVE METABOLIC PANEL
ALBUMIN: 3.2 g/dL — AB (ref 3.5–5.2)
ALK PHOS: 70 U/L (ref 39–117)
ALT: 37 U/L — ABNORMAL HIGH (ref 0–35)
AST: 30 U/L (ref 0–37)
Anion gap: 13 (ref 5–15)
BILIRUBIN TOTAL: 0.7 mg/dL (ref 0.3–1.2)
BUN: 21 mg/dL (ref 6–23)
CHLORIDE: 104 mmol/L (ref 96–112)
CO2: 22 mmol/L (ref 19–32)
Calcium: 9.2 mg/dL (ref 8.4–10.5)
Creatinine, Ser: 0.65 mg/dL (ref 0.50–1.10)
GFR calc Af Amer: >90 mL/min (ref >90)
GFR calc non Af Amer: 82 mL/min — ABNORMAL LOW (ref >90)
Glucose, Bld: 132 mg/dL — ABNORMAL HIGH (ref 70–99)
POTASSIUM: 4.3 mmol/L (ref 3.5–5.1)
Sodium: 139 mmol/L (ref 135–145)
Total Protein: 7.3 g/dL (ref 6.0–8.3)

## 2014-09-27 NOTE — Evaluation (Signed)
Occupational Therapy Assessment and Plan  Patient Details  Name: Diana Hawkins MRN: [id number] Date of Birth: Jul 17, 1932  OT Diagnosis: hemiplegia affecting non-dominant side, lumbago (low back pain) and muscle weakness (generalized) Rehab Potential: Rehab Potential (ACUTE ONLY): Good ELOS: 7-10 days    Today's Date: 09/27/2014 OT Individual Time: 0820-0920 OT Individual Time Calculation (min): 60 min     Problem List:  Patient Active Problem List   Diagnosis Date Noted  . SAH (subarachnoid hemorrhage) 09/26/2014  . Subarachnoid hemorrhage 09/22/2014    Past Medical History:  Past Medical History  Diagnosis Date  . Hepatitis A 01/1976  . Irregular heartbeat 02/2001    Irregular heartbeat checked  . Anemia 07/2008    extreme anemia   Past Surgical History:  Past Surgical History  Procedure Laterality Date  . Colon surgery  1/10    Stage 3 Cancer  . Breast surgery Right 11/1966    Benign tumor right breast removed  . Abdominal hysterectomy  01/1968    Complete   . Oophorectomy Right 08/1964    Right Ovary Removed, benign tumor  . Cataract extraction w/ intraocular lens implant Bilateral 04/03 L eye; 06/03 R eye  . Pancreatic cyst drainage  03/2012    Benign    Assessment & Plan Clinical Impression: Patient is a 81 y.o. right handed female with history of colon cancer 2010 with resection and chronic anemia. Presented 09/21/2014 upon transfer from [facility]. She lives with her daughter and was independent with straight point cane prior to admission but rather sedentary and didn't get out of the house much. Patient with acute onset of headache and altered mental status. CT of the head showed moderate subarachnoid hemorrhage. Negative for posterior communicating artery aneurysm. There is mild hydrocephalus with intraventricular hemorrhage. CT angiogram negative for AVM without evidence of vasospasm. Neurosurgery Dr. Kathyrn Sheriff with conservative care. Decadron protocol  is indicated. Tolerating a regular diet. Physical and occupational therapy evaluation completed 09/24/2014 with recommendations of physical medicine rehabilitation consult.   Patient transferred to CIR on 09/26/2014 .    Patient currently requires mod with basic self-care skills secondary to muscle weakness, impaired timing and sequencing, unbalanced muscle activation and decreased coordination and decreased standing balance and decreased postural control.  Prior to hospitalization, patient could complete ADLs with modified independent .  Patient will benefit from skilled intervention to increase independence with basic self-care skills and increase level of independence with iADL prior to discharge home with care partner.  Anticipate patient will require intermittent supervision and follow up outpatient.  OT - End of Session Activity Tolerance: Tolerates 30+ min activity with multiple rests Endurance Deficit: Yes Endurance Deficit Description: required multiple rest breaks  OT Assessment Rehab Potential (ACUTE ONLY): Good Barriers to Discharge: Decreased caregiver support Barriers to Discharge Comments: Mod I goals OT Patient demonstrates impairments in the following area(s): Balance;Endurance;Motor;Pain;Safety OT Basic ADL's Functional Problem(s): Grooming;Bathing;Dressing;Toileting OT Advanced ADL's Functional Problem(s): Simple Meal Preparation;Laundry OT Transfers Functional Problem(s): Toilet;Tub/Shower OT Additional Impairment(s): None OT Plan OT Intensity: Minimum of 1-2 x/day, 45 to 90 minutes OT Frequency: 5 out of 7 days OT Duration/Estimated Length of Stay: 7-10 days  OT Treatment/Interventions: Balance/vestibular training;Discharge planning;DME/adaptive equipment instruction;Functional mobility training;Neuromuscular re-education;Pain management;Patient/family education;Psychosocial support;Self Care/advanced ADL retraining;Therapeutic Activities;Therapeutic Exercise;UE/LE  Strength taining/ROM;UE/LE Coordination activities OT Self Feeding Anticipated Outcome(s): Mod I OT Basic Self-Care Anticipated Outcome(s): Mod I OT Toileting Anticipated Outcome(s): Mod I OT Bathroom Transfers Anticipated Outcome(s): Mod I OT Recommendation Patient destination: Home Follow Up Recommendations:  Outpatient OT Equipment Recommended: Other (comment) (would recommend grab bars in bathroom)   Skilled Therapeutic Intervention OT eval completed with discussion regarding OT purpose, goals, and ELOS.  ADL assessment at sit > stand level with bathing at shower and dressing at sink.  Pt incontinent of bowel in brief prior to session, transferred w/c > toilet with mod assist secondary to pain in back of legs/hamstrings where pt voided urine and had bowel movement requiring increased time for hygiene.  Ambulated with hand held assist to shower where pt required assist to complete perineal hygiene.  Pt with difficulty completing LB dressing due to LLE pain and weakness.  Pt returned to bed at end of session despite encouragement to sit up.  OT Evaluation Precautions/Restrictions  Precautions Precautions: Fall Restrictions Weight Bearing Restrictions: No General   Vital Signs Therapy Vitals Temp: 97.9 F (36.6 C) Temp Source: Oral Pulse Rate: 86 Resp: 18 BP: 124/61 mmHg Patient Position (if appropriate): Lying Oxygen Therapy SpO2: 100 % O2 Device: Not Delivered Pain Pain Assessment Pain Assessment: 0-10 Pain Score: 7  Pain Type: Chronic pain Pain Location: Leg Pain Orientation: Left;Anterior;Posterior;Upper Pain Descriptors / Indicators: Sore Pain Onset: Gradual Pain Intervention(s): Medication (See eMAR) Home Living/Prior Functioning Home Living Available Help at Discharge: Available PRN/intermittently (daughter teaches school during the day) Type of Home: Other(Comment) (Condo) Home Access: Level entry Home Layout: One level Additional Comments: pt moved from  [place] 2 yrs ago to be with daughter  Lives With: Daughter IADL History Homemaking Responsibilities: Yes Meal Prep Responsibility: Primary Laundry Responsibility: Primary Cleaning Responsibility: Secondary Prior Function Level of Independence: Independent with basic ADLs, Requires assistive device for independence, Other (comment), Independent with homemaking with ambulation (Used quad cane for mobility (per pt, only to get to/from bathroom at night))  Able to Take Stairs?: Yes Driving: No (Rarely) 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 Pt reports she uses a quad cane cane at night due to vision getting up at night ADL  See FIM Vision/Perception  Vision- History Baseline Vision/History: Wears glasses Wears Glasses: Reading only Patient Visual Report: No change from baseline (per pt report) Vision- Assessment Vision Assessment?: No apparent visual deficits  Cognition Overall Cognitive Status: Within Functional Limits for tasks assessed Orientation Level: Oriented X4 Memory: Appears intact Awareness: Appears intact Safety/Judgment: Appears intact Sensation Sensation Light Touch: Appears Intact (BUE, with exception of tips of fingers due to peripheral neuropathy) Stereognosis: Not tested Hot/Cold: Not tested Proprioception: Appears Intact Coordination Gross Motor Movements are Fluid and Coordinated: No Fine Motor Movements are Fluid and Coordinated: No Finger Nose Finger Test: mild overshooting with LUE 9 Hole Peg Test: Rt: 36 seconds, Lt: 40 seconds Motor    Mobility  Bed Mobility Bed Mobility: Supine to Sit;Scooting to HOB;Sit to Supine Supine to Sit: 2: Max assist;HOB flat Supine to Sit Details: Tactile cues for placement;Verbal cues for sequencing;Tactile cues for sequencing;Verbal cues for technique Sit to Supine: HOB flat;3: Mod assist Sit to  Supine - Details (indicate cue type and reason): Mod A for bilat LE management secondary to pain Scooting to HOB: 2: Max assist;With rail Scooting to [facility] Details: Verbal cues for sequencing;Verbal cues for technique;Manual facilitation for placement;Tactile cues for weight bearing Scooting to [facility] Details (indicate cue type and reason): Max A secondary to pain in bilat LE's. Transfers Sit to Stand: 4: Min assist;From bed Sit to Stand Details: Tactile cues for weight shifting Stand to Sit:  4: Min assist;To bed Stand to Sit Details: Min A to control descent  Trunk/Postural Assessment  Cervical Assessment Cervical Assessment: Within Functional Limits Thoracic Assessment Thoracic Assessment: Exceptions to [facility] (thoracic kyphosis) Lumbar Assessment Lumbar Assessment: Exceptions to [facility] (posterior pelvic tilt) Postural Control Postural Control: Deficits on evaluation Trunk Control: Tendency toward lateral trunk lean to L side when seated EOB; able to recognize and self-correct wtith cueing Postural Limitations: Posterior pelvic tilt in seated and semi reclined in bed with increased pain in bilat hamstrings (L>R) when this PT attempted to correct.  Balance   Extremity/Trunk Assessment RUE Assessment RUE Assessment: Within Functional Limits LUE Assessment LUE Assessment: Within Functional Limits (grossly 4 to 4+/5)  FIM:  FIM - Grooming Grooming Steps: Wash, rinse, dry face;Wash, rinse, dry hands;Brush, comb hair Grooming: 4: Steadying assist  or patient completes 3 of 4 or 4 of 5 steps FIM - Bathing Bathing Steps Patient Completed: Chest;Right Arm;Left Arm;Abdomen;Right upper leg;Left upper leg Bathing: 3: Mod-Patient completes 5-7 [id number] parts or 50-74% FIM - Upper Body Dressing/Undressing Upper body dressing/undressing steps patient completed: Thread/unthread right bra strap;Thread/unthread left bra strap;Hook/unhook bra;Thread/unthread right sleeve of pullover shirt/dresss;Thread/unthread  left sleeve of pullover shirt/dress;Put head through opening of pull over shirt/dress Upper body dressing/undressing: 4: Min-Patient completed 75 plus % of tasks FIM - Lower Body Dressing/Undressing Lower body dressing/undressing steps patient completed: Thread/unthread right pants leg;Pull pants up/down;Don/Doff right shoe Lower body dressing/undressing: 3: Mod-Patient completed 50-74% of tasks FIM - Toileting Toileting steps completed by patient: Adjust clothing prior to toileting;Performs perineal hygiene;Adjust clothing after toileting Toileting Assistive Devices: Grab bar or rail for support Toileting: 4: Steadying assist FIM - Bed/Chair Transfer Bed/Chair Transfer: 3: Supine > Sit: Mod A (lifting assist/Pt. 50-74%/lift 2 legs;3: Sit > Supine: Mod A (lifting assist/Pt. 50-74%/lift 2 legs);3: Bed > Chair or W/C: Mod A (lift or lower assist);3: Chair or W/C > Bed: Mod A (lift or lower assist) FIM - TAir cabin crewTransfers Assistive Devices: Elevated toilet seat;Grab bars Toilet Transfers: 3-To toilet/BSC: Mod A (lift or lower assist);4-From toilet/BSC: Min A (steadying Pt. > 75%) FIM - Tub/Shower Transfers Tub/Shower Assistive Devices: Tub transfer bench;Grab bars Tub/shower Transfers: 4-Into Tub/Shower: Min A (steadying Pt. > 75%/lift 1 leg);4-Out of Tub/Shower: Min A (steadying Pt. > 75%/lift 1 leg)   Refer to Care Plan for Long Term Goals  Recommendations for other services: None  Discharge Criteria: Patient will be discharged from OT if patient refuses treatment 3 consecutive times without medical reason, if treatment goals not met, if there is a change in medical status, if patient makes no progress towards goals or if patient is discharged from hospital.  The above assessment, treatment plan, treatment alternatives and goals were discussed and mutually agreed upon: by patient  HSimonne Come2/24/2016, 3:19 PM

## 2014-09-27 NOTE — Progress Notes (Signed)
Occupational Therapy Session Note  Patient Details  Name: Diana Hawkins MRN: [id number] Date of Birth: 28-Mar-1932  Today's Date: 09/27/2014 OT Individual Time: 1300-1330 OT Individual Time Calculation (min): 30 min    Short Term Goals: Week 1:  OT Short Term Goal 1 (Week 1): STG = LTGs due to ELOS  Skilled Therapeutic Interventions/Progress Updates:    Engaged in therapeutic activity with focus on sit <> stand, standing balance, and BUE use during standing.  Pt in bed upon arrival, however willing to participate in treatment session.  Mod assist supine > sit and sit > stand with decreased forward weight shift and reports of pain/tightness in BLE.  In therapy gym engaged in table top task progressing to standing with pt reporting lightheaded and groggy after 2-3 mins of standing.  Pt reports having taken Vicodin approx 1215 and meds typically make her drowsy.  Mod assist sit > stand and min assist to maintain standing balance with pt requiring multiple seated rest breaks.  Requested to return to bed at end of session to rest until PT session.   Therapy Documentation Precautions:  Precautions Precautions: Fall Restrictions Weight Bearing Restrictions: No General:   Vital Signs: Therapy Vitals Temp: 97.9 F (36.6 C) Temp Source: Oral Pulse Rate: 86 Resp: 18 BP: 124/61 mmHg Patient Position (if appropriate): Lying Oxygen Therapy SpO2: 100 % O2 Device: Not Delivered Pain:  Pt reports no pain at rest, pain about 6/10 in LLE with movement  See FIM for current functional status  Therapy/Group: Individual Therapy  Simonne Come 09/27/2014, 3:28 PM

## 2014-09-27 NOTE — Care Management Note (Signed)
Inpatient [facility] Statement of Services  Patient Name:  Diana Hawkins  Date:  09/27/2014  Welcome to the [facility].  Our goal is to provide you with an individualized program based on your diagnosis and situation, designed to meet your specific needs.  With this comprehensive rehabilitation program, you will be expected to participate in at least 3 hours of rehabilitation therapies Monday-Friday, with modified therapy programming on the weekends.  Your rehabilitation program will include the following services:  Physical Therapy (PT), Occupational Therapy (OT), 24 hour per day rehabilitation nursing, Case Management (Social Worker), Rehabilitation Medicine, Nutrition Services and Pharmacy Services  Weekly team conferences will be held on Wednesday to discuss your progress.  Your Social Worker will talk with you frequently to get your input and to update you on team discussions.  Team conferences with you and your family in attendance may also be held.  Expected length of stay: 10 days  Overall anticipated outcome: mod/i-supervision level  Depending on your progress and recovery, your program may change. Your Social Worker will coordinate services and will keep you informed of any changes. Your Social Worker's name and contact numbers are listed  below.  The following services may also be recommended but are not provided by the [facility] will be made to provide these services after discharge if needed.  Arrangements include referral to agencies that provide these services.  Your insurance has been verified to be:  Medicare & [place] Your primary doctor is:  Dawayne Patricia  Pertinent information will be shared with your doctor and your insurance company.  Social Worker:  Ovidio Kin, [place] or ([phone]  Information discussed with  and copy given to patient by: Elease Hashimoto, 09/27/2014, 4:17 PM

## 2014-09-27 NOTE — Progress Notes (Signed)
Social Work [name], female   DOB: 1931-12-17, 81 y.o.   MRN: [id number] Spoke with daughter and pt to inform of team conference goals-mod/i-supervision level goals and discharge 3/4.  Daughter wants to make sure she is safe while she is working Otherwise other arrangements will need to be made.  She reports her mother's leg issues did not just happen it is from not moving at home.  She states: " She decides if she will move or  Is stubborn and will not."  She hopes we can get her to the level where she needs to be but feels it is doubtful.  She reports; " There is a back story there."  Seems much history between pt and daughter Regarding choosing to do or not too.  Will work on discharge plans.

## 2014-09-27 NOTE — Progress Notes (Signed)
Patient admitted to rehab services today, daughter present. Patient AAOX4, resp easy and regular, no S/S distress. LCTA, diminished, occasional non productive cough noted. IV saline lock to L wrist, dressing D&I, flushes easily. Continent of bowel and bladder, max 2 assist to bedside commode. MAEW, skin intact, general weakness. Abdomen SNT, +BS X4 quadrants. Small bowel movement at this time. No edema noted, positve radial and pedal pulses. Able to make needs known, call light in reach, positioned in bed for comfort. C/O occasional L hip pain with positioning, denies need for medication. Lonell Face, RN

## 2014-09-27 NOTE — Evaluation (Signed)
Physical Therapy Assessment and Plan  Patient Details  Name: Diana Hawkins MRN: [id number] Date of Birth: 1931-10-23  PT Diagnosis: Abnormal posture, Abnormality of gait, Impaired sensation, Muscle weakness and Pain in bilateral lower extremities Rehab Potential: Good ELOS: 7-10 days   Today's Date: 09/27/2014 PT Individual Time: 0930-1030 and [id number] PT Individual Time Calculation (min): 60 min and 32 min  Problem List:  Patient Active Problem List   Diagnosis Date Noted  . SAH (subarachnoid hemorrhage) 09/26/2014  . Subarachnoid hemorrhage 09/22/2014    Past Medical History:  Past Medical History  Diagnosis Date  . Hepatitis A 01/1976  . Irregular heartbeat 02/2001    Irregular heartbeat checked  . Anemia 07/2008    extreme anemia   Past Surgical History:  Past Surgical History  Procedure Laterality Date  . Colon surgery  1/10    Stage 3 Cancer  . Breast surgery Right 11/1966    Benign tumor right breast removed  . Abdominal hysterectomy  01/1968    Complete   . Oophorectomy Right 08/1964    Right Ovary Removed, benign tumor  . Cataract extraction w/ intraocular lens implant Bilateral 04/03 L eye; 06/03 R eye  . Pancreatic cyst drainage  03/2012    Benign    Assessment & Plan Clinical Impression: Diana Hawkins is a 81 y.o. right handed female with history of colon cancer 2010 with resection and chronic anemia. Presented 09/21/2014 upon transfer from [facility]. She lives with her daughter and was independent with straight point cane prior to admission but rather sedentary and didn't get out of the house much. Patient with acute onset of headache and altered mental status. CT of the head showed moderate subarachnoid hemorrhage. Negative for posterior communicating artery aneurysm. There is mild hydrocephalus with intraventricular hemorrhage. CT angiogram negative for AVM without evidence of vasospasm. Neurosurgery Dr. Kathyrn Sheriff with conservative care. Decadron  protocol is indicated. Tolerating a regular diet. Patient transferred to CIR on 09/26/2014 .   Patient currently requires min to mod A with mobility secondary to muscle weakness and muscle joint tightness, impaired timing and sequencing, unbalanced muscle activation and decreased coordination and decreased sitting balance, decreased standing balance, decreased postural control and decreased balance strategies.  Prior to hospitalization, patient was modified independent  with mobility and lived with Daughter in a Other(Comment) (Condo) home.  Home access is  Level entry.  Patient will benefit from skilled PT intervention to maximize safe functional mobility and minimize fall risk for planned discharge home with intermittent assist.  Anticipate patient will benefit from follow up OP at discharge.  PT - End of Session Activity Tolerance: Tolerates 30+ min activity with multiple rests Endurance Deficit: Yes Endurance Deficit Description: Activity tolerance limited but question if limited by endurance vs. LE pain and lightheadedness. PT Assessment Rehab Potential (ACUTE/IP ONLY): Good Barriers to Discharge: Decreased caregiver support;Other (comment) Barriers to Discharge Comments: Daughter works full time during day. PT Patient demonstrates impairments in the following area(s): Balance;Endurance;Pain;Safety;Motor PT Transfers Functional Problem(s): Bed Mobility;Bed to Chair;Car;Furniture;Floor PT Locomotion Functional Problem(s): Ambulation;Wheelchair Mobility;Stairs PT Plan PT Intensity: Minimum of 1-2 x/day ,45 to 90 minutes PT Frequency: 5 out of 7 days PT Duration Estimated Length of Stay: 7-10 days PT Treatment/Interventions: Ambulation/gait training;Balance/vestibular training;Disease management/prevention;Discharge planning;DME/adaptive equipment instruction;Functional mobility training;Patient/family education;Pain management;Neuromuscular re-education;Therapeutic Exercise;Therapeutic  Activities;Wheelchair propulsion/positioning;UE/LE Strength taining/ROM;Stair training PT Transfers Anticipated Outcome(s): Mod I PT Locomotion Anticipated Outcome(s): Ambulatory for household distances at Mod I level; supervision for community mobility PT Recommendation Recommendations for Other  Services: Other (comment) (recreational therapy) Follow Up Recommendations: Outpatient PT;Other (comment) (intermittent supervision) Equipment Recommended: To be determined Equipment Details: Pt owns personal quad cane.  Skilled Therapeutic Intervention Session 1: PT evaluation performed; see below for detailed findings. Treatment initiated. Session focused on bed mobility, functional transfers, and gait training. See below for detailed description of assist/cueing required with said aspects of functional mobility. Educated pt on findings, goals, and plan of care. Oriented pt to rehab unit, fall precautions. Pt verbalized understanding of all education and was in full agreement with plan of care. Session limited by pain in bilat LE's and pt-reported lightheadedness; RN made aware. Departed with pt semi reclined in bed with 3 rails up, bed alarm on, and all needs within reach.  Session 2: Pt received semi reclined in bed, asleep and slow to awaken. Pt agreeable to therapy but expressing urgent need to urinate; therefore, provided mod A with stand pivot transfer from bed <> BSC. Noted soiled brief due to incontinence of bowel/bladder; pt unaware. Assisted pt with brief change (via sit<>stand with min A) and hygiene in standing with min A for majority of dynamic standing, mod A to recover from posterior LOB x1 episode.  Pt performed w/c mobility x10' in controlled environment with bilat LE's and mod A secondary to LE fatigue and "weakness," per pt. Transported pt remaining distance to gym, where pt negotiated 3 stairs; see below for detailed description of assist/cueing required with stair negotiation. Pt then  performed gait x10' in controlled environment with min A, L HHA; trial ended per pt request secondary to pt-perception of feeling "really weak." Session ended in pt room, where pt was left seated in w/c with all needs within reach. Pt able to verbalize need for staff assist for all transfers and effectively demonstrated use of call bell.  PT Evaluation Precautions/Restrictions Precautions Precautions: Fall Restrictions Weight Bearing Restrictions: No General   Vital SignsTherapy Vitals Temp: 98.2 F (36.8 C) Temp Source: Oral Pulse Rate: 92 Resp: 18 BP: 138/74 mmHg Patient Position (if appropriate): Lying Oxygen Therapy SpO2: 98 % O2 Device: Not Delivered Pain Pain Assessment Pain Assessment: 0-10 Pain Score: 7  Pain Type: Chronic pain Pain Location: Leg Pain Orientation: Left;Anterior;Posterior;Upper Pain Descriptors / Indicators: Sore Pain Onset: Gradual Pain Intervention(s): Ambulation/increased activity;RN made aware Home Living/Prior Functioning Home Living Available Help at Discharge: Available PRN/intermittently (daughter teaches school during the day) Type of Home: Other(Comment) (Condo) Home Access: Level entry Home Layout: One level Additional Comments: pt moved from [place] 2 yrs ago to be with daughter  Lives With: Daughter Prior Function Level of Independence: Independent with basic ADLs;Requires assistive device for independence;Other (comment);Independent with homemaking with ambulation (Used quad cane for mobility (per pt, only to get to/from bathroom at night))  Able to Take Stairs?: Yes Driving: No (Rarely) 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 Pt reports she uses a quad cane cane at night due to vision getting up at night Cognition Overall Cognitive Status: Within Functional Limits for tasks assessed Orientation Level: Oriented  X4 Memory: Appears intact Awareness: Appears intact Safety/Judgment: Appears intact Sensation Sensation Light Touch: Impaired Detail Light Touch Impaired Details: Impaired RUE;Impaired LUE;Impaired RLE;Impaired LLE Proprioception: Impaired Detail Proprioception Impaired Details: Impaired RLE;Impaired LLE Additional Comments: Light touch impaired in bilat finger tips secondary to h/o peripheral neuropathy. Light touch and proprioception impaired in bilat LE's (distal to knees). Coordination Gross Motor Movements are Fluid and Coordinated: No  Fine Motor Movements are Fluid and Coordinated: No Finger Nose Finger Test: mild overshooting with LUE 9 Hole Peg Test: Rt: 36 seconds, Lt: 40 seconds Motor  Motor Motor: Abnormal postural alignment and control;Other (comment) Motor - Skilled Clinical Observations: LLE weakness, posterior preference in standing.  Mobility Bed Mobility Bed Mobility: Supine to Sit;Scooting to [facility];Sit to Supine Supine to Sit: 2: Max assist;HOB flat Supine to Sit Details: Tactile cues for placement;Verbal cues for sequencing;Tactile cues for sequencing;Verbal cues for technique Sit to Supine: HOB flat;3: Mod assist Sit to Supine - Details (indicate cue type and reason): Mod A for bilat LE management secondary to pain Scooting to HOB: 2: Max assist;With rail Scooting to [facility] Details: Verbal cues for sequencing;Verbal cues for technique;Manual facilitation for placement;Tactile cues for weight bearing Scooting to [facility] Details (indicate cue type and reason): Max A secondary to pain in bilat LE's. Transfers Transfers: Yes Sit to Stand: 4: Min assist;From bed Sit to Stand Details: Tactile cues for weight shifting Stand to Sit: 4: Min assist;To bed Stand to Sit Details: Min A to control descent Stand Pivot Transfers: 3: Mod assist Locomotion  Ambulation Ambulation: Yes Ambulation/Gait Assistance: 4: Min assist Ambulation Distance (Feet): 15 Feet Assistive device: 1  person hand held assist Ambulation/Gait Assistance Details: Pt performed functional ambulation x15' in controlled environment with min A, L HHA; ambulation distance limited by pt-reported lightheadedness and significant pain in bilat LE's (L > R). Gait Gait: Yes Gait Pattern: Impaired Gait Pattern: Step-through pattern;Trunk flexed;Decreased trunk rotation;Left flexed knee in stance;Right flexed knee in stance;Decreased dorsiflexion - left;Decreased dorsiflexion - right;Decreased stride length  Trunk/Postural Assessment  Cervical Assessment Cervical Assessment: Within Functional Limits Thoracic Assessment Thoracic Assessment: Exceptions to [facility] (thoracic kyphosis) Lumbar Assessment Lumbar Assessment: Exceptions to [facility] (posterior pelvic tilt) Postural Control Postural Control: Deficits on evaluation Trunk Control: Tendency toward lateral trunk lean to L side when seated EOB; able to recognize and self-correct wtith cueing Postural Limitations: Posterior pelvic tilt in seated and semi reclined in bed with increased pain in bilat hamstrings (L>R) when this PT attempted to correct.  Balance Balance Balance Assessed: Yes Dynamic Sitting Balance Dynamic Sitting - Balance Support: No upper extremity supported;Feet unsupported Dynamic Sitting - Level of Assistance: 5: Stand by assistance Sitting balance - Comments: Pt with tendency toward lateral trunk lean to L side, propping on L forearm; able to recognize, self-correct with cueing. Dynamic Standing Balance Dynamic Standing - Balance Support: No upper extremity supported;During functional activity Dynamic Standing - Level of Assistance: 4: Min assist;3: Mod assist Dynamic Standing - Balance Activities: Reaching for objects;Lateral lean/weight shifting Dynamic Standing - Comments: While standing to perform hygeine during toileting, pt required min A for stability, mod A to recover from significant posterior LOB. Extremity Assessment  RLE  Assessment RLE Assessment: Exceptions to [facility] RLE AROM (degrees) Overall AROM Right Lower Extremity: Deficits RLE PROM (degrees) Overall PROM Right Lower Extremity: Deficits ( ) RLE Overall PROM Comments: R knee extension PROM WNL but painful; noted decreased hamstring extensibility RLE Strength RLE Overall Strength: Deficits RLE Overall Strength Comments: Grossly 4-/5 in R hip and 4/5 in R knee/ankle. LLE Assessment LLE Assessment: Exceptions to WFL LLE AROM (degrees) Overall AROM Left Lower Extremity: Deficits LLE Overall AROM Comments: In seated, L knee extension AROM limited. LLE PROM (degrees) Overall PROM Left Lower Extremity: Deficits LLE Overall PROM Comments: L knee extension PROM WNL but painful; AROM appears to be limited by decreased hamstring extensibility  FIM:  FIM - Bed/Chair Transfer  Bed/Chair Transfer: 3: Sit > Supine: Mod A (lifting assist/Pt. 50-74%/lift 2 legs);3: Bed > Chair or W/C: Mod A (lift or lower assist);3: Chair or W/C > Bed: Mod A (lift or lower assist);2: Supine > Sit: Max A (lifting assist/Pt. 25-49%);3: Supine > Sit: Mod A (lifting assist/Pt. 50-74%/lift 2 legs FIM - Locomotion: Ambulation Locomotion: Ambulation Assistive Devices: Other (comment) (L HHA) Ambulation/Gait Assistance: 4: Min assist Locomotion: Ambulation: 1: Travels less than 50 ft with minimal assistance (Pt.>75%) FIM - Locomotion: Stairs Locomotion: Stairs: 0: Activity did not occur (Not tested; safety concerns due to LE pain, lightheadedness)   Refer to Care Plan for Long Term Goals  Recommendations for other services: Other: recreational therapy  Discharge Criteria: Patient will be discharged from PT if patient refuses treatment 3 consecutive times without medical reason, if treatment goals not met, if there is a change in medical status, if patient makes no progress towards goals or if patient is discharged from hospital.  The above assessment, treatment plan, treatment  alternatives and goals were discussed and mutually agreed upon: by patient  Stefano Gaul 09/27/2014, 7:23 PM

## 2014-09-27 NOTE — Progress Notes (Signed)
81 y.o. right handed female with history of colon cancer 2010 with resection and chronic anemia. Presented 09/21/2014 upon transfer from [facility]. She lives with her daughter and was independent with straight point cane prior to admission but rather sedentary and didn't get out of the house much. Patient with acute onset of headache and altered mental status. CT of the head showed moderate subarachnoid hemorrhage. Negative for posterior communicating artery aneurysm. There is mild hydrocephalus with intraventricular hemorrhage. CT angiogram negative for AVM without evidence of vasospasm. Neurosurgery Dr. Kathyrn Sheriff with conservative care  Subjective/Complaints: Pt slept ok, main concern is LLE weakness and facial flushing Tolerated ADLs but feels tired  Review of Systems - Negative except LLE weakness Objective: Vital Signs: Blood pressure 138/74, pulse 92, temperature 98.2 F (36.8 C), temperature source Oral, resp. rate 18, height 5\' 5"  (1.651 m), weight 60.555 kg (133 lb 8 oz), SpO2 98 %. No results found. Results for orders placed or performed during the hospital encounter of 09/26/14 (from the past 72 hour(s))  CBC WITH DIFFERENTIAL     Status: Abnormal   Collection Time: 09/27/14  7:14 AM  Result Value Ref Range   WBC 14.3 (H) 4.0 - 10.5 K/uL   RBC 5.16 (H) 3.87 - 5.11 MIL/uL   Hemoglobin 14.7 12.0 - 15.0 g/dL   HCT 43.8 36.0 - 46.0 %   MCV 84.9 78.0 - 100.0 fL   MCH 28.5 26.0 - 34.0 pg   MCHC 33.6 30.0 - 36.0 g/dL   RDW 14.3 11.5 - 15.5 %   Platelets 325 150 - 400 K/uL   Neutrophils Relative % 86 (H) 43 - 77 %   Neutro Abs 12.3 (H) 1.7 - 7.7 K/uL   Lymphocytes Relative 7 (L) 12 - 46 %   Lymphs Abs 1.0 0.7 - 4.0 K/uL   Monocytes Relative 7 3 - 12 %   Monocytes Absolute 1.0 0.1 - 1.0 K/uL   Eosinophils Relative 0 0 - 5 %   Eosinophils Absolute 0.0 0.0 - 0.7 K/uL   Basophils Relative 0 0 - 1 %   Basophils Absolute 0.0 0.0 - 0.1 K/uL     HEENT: normal Cardio:  RRR and no murmurs Resp: CTA B/L GI: BS positive and NT, ND Extremity:  Pulses positive and No Edema Skin:   Other mild flushing Neuro: Alert/Oriented, Flat and Abnormal Motor Moto 5/5 in bilateral deltoid, biceps, triceps, grip, right hip flexor and knee extensor and ankle dorsal flexor plantar flexor Musc/Skel:  Other no pain with lower extremity range of motion Gen. no acute distress   Assessment/Plan: 1. 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 perimesencephalic cistern and left sylvian fissure  which require 3+ hours per day of interdisciplinary therapy in a comprehensive inpatient rehab setting. Physiatrist is providing close team supervision and 24 hour management of active medical problems listed below. Physiatrist and rehab team continue to assess barriers to discharge/monitor patient progress toward functional and medical goals. FIM:          FIM - Radio producer Devices: Bedside commode Toilet Transfers: 2-From toilet/BSC: Max A (lift and lower assist)                       Medical Problem List and Plan: 1. 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 perimesencephalic cistern and left sylvian fissure  2.  DVT Prophylaxis/Anticoagulation: SCDs. Monitor for any signs of DVT 3. Pain Management: Tylenol as needed 4. Chronic anemia. Follow-up CBC 5. Neuropsych: This patient is capable of making decisions on her own behalf. 6. Skin/Wound Care: OOB, encourage adequate nutrition 7. Fluids/Electrolytes/Nutrition: eating fairly well. BMET pending for the AM 8. Mood/depression. Resume Remeron 15 mg daily at bedtime as prior to admission. Provide emotional support 9. History of colon cancer 2010 10.Hypertension.Nimodipine 60 mg QID   LOS (Days) 1 A FACE  TO FACE EVALUATION WAS PERFORMED  KIRSTEINS,ANDREW E 09/27/2014, 8:36 AM

## 2014-09-27 NOTE — Patient Care Conference (Signed)
Inpatient RehabilitationTeam Conference and Plan of Care Update Date: 09/27/2014   Time: 11;10 AM    Patient Name: Diana Hawkins      Medical Record Number: [id number]  Date of Birth: 12-06-1931 Sex: Female         Room/Bed: 4W09C/4W09C-01 Payor Info: Payor: MEDICARE / Plan: MEDICARE PART A AND B / Product Type: *No Product type* /    Admitting Diagnosis: SAH  Admit Date/Time:  09/26/2014  5:43 PM Admission Comments: No comment available   Primary Diagnosis:  SAH (subarachnoid hemorrhage) Principal Problem: SAH (subarachnoid hemorrhage)  Patient Active Problem List   Diagnosis Date Noted  . SAH (subarachnoid hemorrhage) 09/26/2014  . Subarachnoid hemorrhage 09/22/2014    Expected Discharge Date: Expected Discharge Date: 10/06/14  Team Members Present: Physician leading conference: Dr. Alysia Penna Social Worker Present: Ovidio Kin, LCSW Nurse Present: Heather Roberts, RN PT Present: Billie Ruddy, Renaye Rakers, PT OT Present: Willeen Cass, OT;Julia Saguier, Jules Schick, OT SLP Present: Windell Moulding, SLP     Current Status/Progress Goal Weekly Team Focus  Medical   poor endurance, LLE weak  Home d/c  improve activity tolerance and LLE weakness   Bowel/Bladder     cont B & B        Swallow/Nutrition/ Hydration     na        ADL's   min-mod assist stand pivot transfers, mod assist bathing, min-mod assist dressing.  Pt with pain in LLE impeding mobility and self-care tasks  Mod I overall  transfers, sit <> stand, home making tasks, pt and family education   Mobility   Mod-Max A with bed mobility, Mod A transfers, Min A gait. Limited by LE pain (L>R) and lightheadedness  Mod I with mobility, ambulation for household distances; supervision car transfers and community mobility  Address LE pain and lightheadedness to increase participation; bed mobility, transfer/gait training, NMR, activity tolerance, dynamic standing balance   Communication     na         Safety/Cognition/ Behavioral Observations    no unsafe behaviors        Pain     monitor legs have been bothering her   monitor her pain in legs     Skin     monitor skin-at this time no issues           *See Care Plan and progress notes for long and short-term goals.  Barriers to Discharge: See above    Possible Resolutions to Barriers:  Initiate rehab program    Discharge Planning/Teaching Needs:    Home with daughter who is a Public relations account executive and is gone during the day-will need to be mod/i to go home.     Team Discussion:  New eval-mod/i goals for ambulation and supervision for tub and stairs.  No SP orders. Leg pain which feel is hamstring-muscular issues.   Revisions to Treatment Plan:  New eval   Continued Need for Acute Rehabilitation Level of Care: The patient requires daily medical management by a physician with specialized training in physical medicine and rehabilitation for the following conditions: Daily direction of a multidisciplinary physical rehabilitation program to ensure safe treatment while eliciting the highest outcome that is of practical value to the patient.: Yes Daily medical management of patient stability for increased activity during participation in an intensive rehabilitation regime.: Yes Daily analysis of laboratory values and/or radiology reports with any subsequent need for medication adjustment of medical intervention for : Neurological problems  Shields Pautz, Wells Guiles  G 09/29/2014, 9:39 AM

## 2014-09-28 ENCOUNTER — Inpatient Hospital Stay (HOSPITAL_COMMUNITY): Payer: Medicare Other | Admitting: Occupational Therapy

## 2014-09-28 ENCOUNTER — Inpatient Hospital Stay (HOSPITAL_COMMUNITY): Payer: PRIVATE HEALTH INSURANCE | Admitting: Physical Therapy

## 2014-09-28 DIAGNOSIS — M7989 Other specified soft tissue disorders: Secondary | ICD-10-CM

## 2014-09-28 LAB — GLUCOSE, CAPILLARY: Glucose-Capillary: 125 mg/dL — ABNORMAL HIGH (ref 70–99)

## 2014-09-28 NOTE — Progress Notes (Signed)
Patient information reviewed and entered into eRehab system by Jameis Newsham, RN, CRRN, PPS Coordinator.  Information including medical coding and functional independence measure will be reviewed and updated through discharge.     Per nursing patient was given "Data Collection Information Summary for Patients in Inpatient Rehabilitation Facilities with attached "Privacy Act Statement-Health Care Records" upon admission.  

## 2014-09-28 NOTE — Progress Notes (Signed)
Social Work Assessment and Plan Social Work Assessment and Plan  Patient Details  Name: Diana Hawkins MRN: [id number] Date of Birth: May 09, 1932  Today's Date: 09/28/2014  Problem List:  Patient Active Problem List   Diagnosis Date Noted  . SAH (subarachnoid hemorrhage) 09/26/2014  . Subarachnoid hemorrhage 09/22/2014   Past Medical History:  Past Medical History  Diagnosis Date  . Hepatitis A 01/1976  . Irregular heartbeat 02/2001    Irregular heartbeat checked  . Anemia 07/2008    extreme anemia   Past Surgical History:  Past Surgical History  Procedure Laterality Date  . Colon surgery  1/10    Stage 3 Cancer  . Breast surgery Right 11/1966    Benign tumor right breast removed  . Abdominal hysterectomy  01/1968    Complete   . Oophorectomy Right 08/1964    Right Ovary Removed, benign tumor  . Cataract extraction w/ intraocular lens implant Bilateral 04/03 L eye; 06/03 R eye  . Pancreatic cyst drainage  03/2012    Benign   Social History:  reports that she has never smoked. She does not have any smokeless tobacco history on file. Her alcohol and drug histories are not on file.  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  She was able to manage at home while her daughter worked but was very sedentary and didn't like leaving the home.  She has limited supports due to recent move and not getting out much.  Social History Preferred language: English Religion:  Cultural Background: No issues Education: High School Read: Yes Write: Yes Employment Status: Retired Freight forwarder Issues: No issues Guardian/Conservator: None-according to MD pt is capable of  making her own decisions while here.  She seems very passive will include daughter in any decision while here.   Abuse/Neglect Physical Abuse: Denies Verbal Abuse: Denies Sexual Abuse: Denies Exploitation of patient/patient's resources: Denies Self-Neglect: Denies  Emotional Status Pt's affect, behavior adn adjustment status: Pt is motivated to move but is having leg pain and this limits her in therapies.  She wants to be independent and be safe while her daughte ris gone working.  She realizes she has not been doing what she should and plans to change this.  Recent Psychosocial Issues: Other health issues-colon cancer 2010 Pyschiatric History: History of depression was not being followed by MH but was suggested to she and daughter.  May benefit from neuro-psych seeing while here. Does appear flat and distant-no tmaking much eye contact. Substance Abuse History: No issues  Patient / Family Perceptions, Expectations & Goals Pt/Family understanding of illness & functional limitations: Pt and daughter can explain her reason for being in the hospital.  Daughter feels her issues are long standing and is not that confident she will reach mod/i level.  Has spoken with MD and feels her questions are being addressed. Premorbid pt/family roles/activities: Mother, Church member, etc Anticipated changes in roles/activities/participation: resume Pt/family expectations/goals: Pt states: " I want to to for myself when I leave here, like I was."  Daughter states: "I'm not sure she is motivated and willing to do waht she needs to do seems to be like her Mother and if she doesn't want to do she won't."  [employer]: None  Premorbid Home Care/DME Agencies: Other (Comment) (had in past) Transportation available at discharge: daughter Resource referrals recommended: Neuropsychology, Support group (specify)  Discharge Planning Living Arrangements: Children Support Systems: Children,  Church/faith community Type of Residence: Private residence Insurance Resources: [employer], Multimedia programmer (specify) Web designer) Financial Resources: Radio broadcast assistant Screen Referred: No Living Expenses: Lives with family Money Management: Family Does the patient have any problems obtaining your medications?: No Home Management: Daughter does Patient/Family Preliminary Plans: Will depdne upon what level she reaches here, if not mod/i levle can not go home with daughter since no one is there during the day while she is working.  Pt PTA did not do much during the day quite sedentary according to her daughter.  Aware of the options hiring assist versus NHP.  Will aait team goals. Social Work Anticipated Follow Up Needs: HH/OP, SNF, Support Group  Clinical Impression Pt seems to want to do for herself, but will see if she can back this up.  She has the potential to become mod/i, daughter lacks confidence in her attaining these goals and questions if she can come home at discharge. Sounds like daughter has tried to get pt move mobile but has been met with resistance from pt.  Will work with both on the safest discharge plan.  Elease Hashimoto 09/28/2014, 9:52 AM

## 2014-09-28 NOTE — Progress Notes (Signed)
Occupational Therapy Session Note  Patient Details  Name: Desiraye Rolfson MRN: [id number] Date of Birth: 1931-10-31  Today's Date: 09/28/2014 OT Individual Time: 0930-1030 and 1300-1400 OT Individual Time Calculation (min): 60 min and 60 min   Short Term Goals: Week 1:  OT Short Term Goal 1 (Week 1): STG = LTGs due to ELOS  Skilled Therapeutic Interventions/Progress Updates:    1) Engaged in ADL retraining with focus on functional transfers, sit <> stand, and education on adaptive dressing techniques.  Pt received supine in bed reporting "gurgling" in stomach and requesting to just wash up at EOB.  Pt with increased participation in bed mobility, only requiring steady assist with sidelying to sitting this session.  Utilized step stool to assist with LB dressing as pt with difficulty raising LLE as well as reporting tightness in LLE, pt still required assist to thread Lt leg this session.  Sit> stand min assist throughout this session with increased forward weight shift and trunk control.  Stand pivot transfer bed > w/c > toilet with min assist.  Pt completed toileting with min/steady assist when pulling up pants.  Pt sat to complete grooming tasks with increased time.  Stand pivot back to bed at end of session.  Discussed increased OOB tolerance and importance of increased movement.  Provided pt basic leg exercises with ankle pumps and heel slides in bed to increase mobility even while at rest.  2) Engaged in therapeutic activity with focus on functional mobility, sit <> stand, and standing balance.  Pt received seated upright in bed finishing lunch.  Ambulated with hand held assist to bathroom where pt completed toileting tasks with min/steady assist.  Completed hand hygiene and brushing hair in standing at sink.  In therapy gym engaged in sit <> stand with focus on transitional movements with pt progressing to close supervision with sit > stand.  In standing completed 2 sets of 10 heel raises and squats  then one set of 10 combination.  Pt reported "woozy" with sit <> stand, BP 106/49 after about 1 min rest pt reports symptoms subsiding.  Returned to room and left seated up in w/c with K-pad under legs to attempt to decrease pain in backs of upper thighs/hamstrings.  Therapy Documentation Precautions:  Precautions Precautions: Fall Restrictions Weight Bearing Restrictions: No General:   Vital Signs: Therapy Vitals Temp: 98.5 F (36.9 C) Temp Source: Oral Pulse Rate: 83 Resp: 20 BP: 127/66 mmHg Patient Position (if appropriate): Lying Oxygen Therapy SpO2: 100 % O2 Device: Not Delivered Pain: Pain Assessment Pain Assessment: No/denies pain Pain Score: 4  Pain Type: Chronic pain Pain Location: Hip Pain Orientation: Left Pain Descriptors / Indicators: Aching Pain Frequency: Constant Pain Onset: Gradual Patients Stated Pain Goal: 3 Pain Intervention(s): Repositioned PAINAD (Pain Assessment in Advanced Dementia) Breathing: normal  See FIM for current functional status  Therapy/Group: Individual Therapy  Simonne Come 09/28/2014, 10:56 AM

## 2014-09-28 NOTE — Progress Notes (Signed)
80 y.o. right handed female with history of colon cancer 2010 with resection and chronic anemia. Presented 09/21/2014 upon transfer from [facility]. She lives with her daughter and was independent with straight point cane prior to admission but rather sedentary and didn't get out of the house much. Patient with acute onset of headache and altered mental status. CT of the head showed moderate subarachnoid hemorrhage. Negative for posterior communicating artery aneurysm. There is mild hydrocephalus with intraventricular hemorrhage. CT angiogram negative for AVM without evidence of vasospasm. Neurosurgery Dr. Kathyrn Sheriff with conservative care  Subjective/Complaints: patient has left lower extremity pain in the thigh as well as the calf. She also has mild right lower extremity pain mainly in the thigh, no swelling noted for patient Tolerated ADLs but feels tired  Review of Systems - Negative except LLE weakness Objective: Vital Signs: Blood pressure 125/64, pulse 86, temperature 98.5 F (36.9 C), temperature source Oral, resp. rate 20, height '5\' 5"'  (1.651 m), weight 58.5 kg (128 lb 15.5 oz), SpO2 100 %. No results found. Results for orders placed or performed during the hospital encounter of 09/26/14 (from the past 72 hour(s))  CBC WITH DIFFERENTIAL     Status: Abnormal   Collection Time: 09/27/14  7:14 AM  Result Value Ref Range   WBC 14.3 (H) 4.0 - 10.5 K/uL   RBC 5.16 (H) 3.87 - 5.11 MIL/uL   Hemoglobin 14.7 12.0 - 15.0 g/dL   HCT 43.8 36.0 - 46.0 %   MCV 84.9 78.0 - 100.0 fL   MCH 28.5 26.0 - 34.0 pg   MCHC 33.6 30.0 - 36.0 g/dL   RDW 14.3 11.5 - 15.5 %   Platelets 325 150 - 400 K/uL   Neutrophils Relative % 86 (H) 43 - 77 %   Neutro Abs 12.3 (H) 1.7 - 7.7 K/uL   Lymphocytes Relative 7 (L) 12 - 46 %   Lymphs Abs 1.0 0.7 - 4.0 K/uL   Monocytes Relative 7 3 - 12 %   Monocytes Absolute 1.0 0.1 - 1.0 K/uL   Eosinophils Relative 0 0 - 5 %   Eosinophils Absolute 0.0 0.0 - 0.7  K/uL   Basophils Relative 0 0 - 1 %   Basophils Absolute 0.0 0.0 - 0.1 K/uL  Comprehensive metabolic panel     Status: Abnormal   Collection Time: 09/27/14  7:14 AM  Result Value Ref Range   Sodium 139 135 - 145 mmol/L   Potassium 4.3 3.5 - 5.1 mmol/L   Chloride 104 96 - 112 mmol/L   CO2 22 19 - 32 mmol/L   Glucose, Bld 132 (H) 70 - 99 mg/dL   BUN 21 6 - 23 mg/dL   Creatinine, Ser 0.65 0.50 - 1.10 mg/dL   Calcium 9.2 8.4 - 10.5 mg/dL   Total Protein 7.3 6.0 - 8.3 g/dL   Albumin 3.2 (L) 3.5 - 5.2 g/dL   AST 30 0 - 37 U/L   ALT 37 (H) 0 - 35 U/L   Alkaline Phosphatase 70 39 - 117 U/L   Total Bilirubin 0.7 0.3 - 1.2 mg/dL   GFR calc non Af Amer 82 (L) >90 mL/min   GFR calc Af Amer >90 >90 mL/min    Comment: (NOTE) The eGFR has been calculated using the CKD EPI equation. This calculation has not been validated in all clinical situations. eGFR's persistently <90 mL/min signify possible Chronic Kidney Disease.    Anion gap 13 5 - 15  Glucose, capillary  Status: Abnormal   Collection Time: 09/28/14 11:40 AM  Result Value Ref Range   Glucose-Capillary 125 (H) 70 - 99 mg/dL     HEENT: normal Cardio: RRR and no murmurs Resp: CTA B/L GI: BS positive and NT, ND Extremity:  Pulses positive and No Edema Skin:   Other mild flushing Neuro: Alert/Oriented, Flat and Abnormal Motor Moto 5/5 in bilateral deltoid, biceps, triceps, grip, right hip flexor and knee extensor and ankle dorsal flexor plantar flexor Musc/Skel:  Other no pain with lower extremity range of motion, tenderness to palpation left quadricep greater than left hamstring, mild left lower extremity swelling including the. Mild left calf tenderness, minimal right calf and minimal right thigh tenderness No lower extremity erythema Gen. no acute distress   Assessment/Plan: 1. 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  perimesencephalic cistern and left sylvian fissure  which require 3+ hours per day of interdisciplinary therapy in a comprehensive inpatient rehab setting. Physiatrist is providing close team supervision and 24 hour management of active medical problems listed below. Physiatrist and rehab team continue to assess barriers to discharge/monitor patient progress toward functional and medical goals. FIM: FIM - Bathing Bathing Steps Patient Completed: Chest, Right Arm, Left Arm, Abdomen, Right upper leg, Left upper leg Bathing: 4: Min-Patient completes 8-9 10f10 parts or 75+ percent  FIM - Upper Body Dressing/Undressing Upper body dressing/undressing steps patient completed: Thread/unthread right bra strap, Thread/unthread left bra strap, Hook/unhook bra, Thread/unthread right sleeve of pullover shirt/dresss, Thread/unthread left sleeve of pullover shirt/dress, Put head through opening of pull over shirt/dress, Pull shirt over trunk Upper body dressing/undressing: 5: Set-up assist to: Obtain clothing/put away FIM - Lower Body Dressing/Undressing Lower body dressing/undressing steps patient completed: Thread/unthread right pants leg, Pull pants up/down Lower body dressing/undressing: 3: Mod-Patient completed 50-74% of tasks  FIM - Toileting Toileting steps completed by patient: Adjust clothing prior to toileting, Performs perineal hygiene, Adjust clothing after toileting Toileting Assistive Devices: Grab bar or rail for support Toileting: 4: Steadying assist  FIM - TRadio producerDevices: Grab bars, BRecruitment consultantTransfers: 4-To toilet/BSC: Min A (steadying Pt. > 75%), 4-From toilet/BSC: Min A (steadying Pt. > 75%)  FIM - Bed/Chair Transfer Bed/Chair Transfer Assistive Devices: Bed rails Bed/Chair Transfer: 4: Supine > Sit: Min A (steadying Pt. > 75%/lift 1 leg), 4: Sit > Supine: Min A (steadying pt. > 75%/lift 1 leg), 4: Bed > Chair or W/C: Min A (steadying  Pt. > 75%), 4: Chair or W/C > Bed: Min A (steadying Pt. > 75%)  FIM - Locomotion: Wheelchair Distance: 10 Locomotion: Wheelchair: 1: Travels less than 50 ft with moderate assistance (Pt: 50 - 74%) FIM - Locomotion: Ambulation Locomotion: Ambulation Assistive Devices: Other (comment) Ambulation/Gait Assistance: 4: Min assist Locomotion: Ambulation: 1: Travels less than 50 ft with minimal assistance (Pt.>75%)  Comprehension Comprehension Mode: Auditory Comprehension: 6-Follows complex conversation/direction: With extra time/assistive device  Expression Expression Mode: Verbal Expression: 5-Expresses complex 90% of the time/cues < 10% of the time  Social Interaction Social Interaction: 6-Interacts appropriately with others with medication or extra time (anti-anxiety, antidepressant).  Problem Solving Problem Solving: 5-Solves complex 90% of the time/cues < 10% of the time  Memory Memory: 6-Assistive device: No helper  Medical Problem List and Plan: 1. 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  DVT Prophylaxis/Anticoagulation: SCDs.  Monitor for any signs of DVT, patient does have lower extremity tenderness no significant swelling however will check lower extremity venous Dopplers 3. Pain Management: Tylenol as needed 4. Chronic anemia. Follow-up CBC 5. Neuropsych: This patient is capable of making decisions on her own behalf. 6. Skin/Wound Care: OOB, encourage adequate nutrition 7. Fluids/Electrolytes/Nutrition: eating fairly well. BMET pending for the AM 8. Mood/depression. Resume Remeron 15 mg daily at bedtime as prior to admission. Provide emotional support 9. History of colon cancer 2010 10.Hypertension.Nimodipine 60 mg QID   LOS (Days) 2 A FACE TO FACE EVALUATION WAS PERFORMED  KIRSTEINS,ANDREW E 09/28/2014, 1:16 PM

## 2014-09-28 NOTE — Progress Notes (Signed)
Physical Therapy Session Note  Patient Details  Name: Diana Hawkins MRN: [id number] Date of Birth: May 08, 1932  Today's Date: 09/28/2014 PT Individual Time: 1420-1520 PT Individual Time Calculation (min): 60 min   Short Term Goals: Week 1:  PT Short Term Goal 1 (Week 1): STG's = LTG's secondary to anticipated LOS.  Skilled Therapeutic Interventions/Progress Updates:    Pt received seated in w/c, requesting for NT to assist to bed. With min encouragement, pt agreeable to participating in PT session. Pt performed stand pivot transfer from w/c <> toilet with min A using grab bar. Upon transferring from toilet, pt expressing feeling "woozy". Seated BP: 104/46; standing BP: 74/46 and symptomatic. See vital signs for further details. Notified RN, who then made PA aware. After assisting pt into supine, elevating bilat LE's, pt asymptomatic and BP WNL. See vitals for detailed readings.  Per MD order, placed thigh high Teds. Educated pt on strategies for managing orthostatic hypotension with focus on hydration, postural adjustment, compression garments, and performance of LE therex. Emphasized importance of alerting therapy/nursing staff to symptoms to decrease fall risk. Pt verbalized understanding. Gradually increased HOB while monitoring BP incrementally, using strategies described above. Upon standing, pt requesting to return to bed due to ongoing vague pain in bilat LE's. Upon returning to supine, performed contract-relax PNF alternating with prolonged passive stretch of bilat hamstrings x2 minutes per side. Despite pt c/o pain in proximal L thigh during activity, noted L iliopsoas, rectus femoris extensibility grossly WFL; no reproduction/increase in concordant pain with resisted hip flexion; no tenderness to palpation. Departed with pt semi reclined in bed with 3 rails up, bed alarm on, and all needs within reach.  Therapy Documentation Precautions:  Precautions Precautions: Fall Restrictions Weight  Bearing Restrictions: No Vital Signs: Therapy Vitals Temp: 98.1 F (36.7 C) Temp Source: Oral Pulse Rate: 88 Resp: 20 BP: 105/63 mmHg (Pt reporting feeling "a little woozy") Patient Position (if appropriate): Standing (Teds on) Oxygen Therapy SpO2: 100 % O2 Device: Not Delivered Pain: Pain Assessment Pain Assessment: 0-10 Pain Score: 6  Pain Type: Chronic pain Pain Location: Leg Pain Orientation: Left;Posterior;Mid Pain Descriptors / Indicators: Aching Pain Onset: On-going Pain Intervention(s): Heat applied Locomotion : Ambulation Ambulation/Gait Assistance: 4: Min assist   See FIM for current functional status  Therapy/Group: Individual Therapy  Stefano Gaul 09/28/2014, 4:48 PM

## 2014-09-29 ENCOUNTER — Inpatient Hospital Stay (HOSPITAL_COMMUNITY): Payer: PRIVATE HEALTH INSURANCE | Admitting: Rehabilitation

## 2014-09-29 ENCOUNTER — Inpatient Hospital Stay (HOSPITAL_COMMUNITY): Payer: Medicare Other | Admitting: Occupational Therapy

## 2014-09-29 DIAGNOSIS — F411 Generalized anxiety disorder: Secondary | ICD-10-CM

## 2014-09-29 DIAGNOSIS — M79609 Pain in unspecified limb: Secondary | ICD-10-CM

## 2014-09-29 NOTE — Progress Notes (Signed)
VASCULAR LAB PRELIMINARY  PRELIMINARY  PRELIMINARY  PRELIMINARY  Bilateral lower extremity venous duplex  completed.    Preliminary report:  Bilateral:  No evidence of DVT, superficial thrombosis, or Baker's Cyst.    Traver Meckes, RVT 09/29/2014, 11:14 AM

## 2014-09-29 NOTE — Progress Notes (Signed)
Occupational Therapy Session Note  Patient Details  Name: Diana Hawkins MRN: [id number] Date of Birth: 03-Jan-1932  Today's Date: 09/29/2014 OT Individual Time: 6811-5726 and 1310-1405 OT Individual Time Calculation (min): 60 min and 55 min   Short Term Goals: Week 1:  OT Short Term Goal 1 (Week 1): STG = LTGs due to ELOS  Skilled Therapeutic Interventions/Progress Updates:    1) Engaged in ADL retraining with focus on OOB activity tolerance, sit <> stand, and functional transfers.  Pt received in bed, reporting pain and "stiffness" but willing to participate.  Bathing and dressing completed at sit > stand level at sink with pt demonstrating increased ability to complete sit > stand without pulling up on sink.  Pt demonstrating improved anterior weight shift with sit <> stand which increases her independence with transfers and LB dressing.  Educated on hemi-dressing technique with LB dressing, pt required increased time and encouragement when donning pants.  Stand pivot w/c > toilet with close supervision with use of grab bars, pt able to complete toileting tasks and stand pivot transfer with close supervision this session.  Pt returned to bed at end of session despite encouragement to sit up in w/c to increase OOB tolerance.  2) Engaged in therapeutic activity with focus on sit <> stand, standing balance, and reaching tasks in ADL kitchen.  Pt received seated upright in bed finishing lunch.  Ambulated to toilet with hand held assist, noted small shuffling gait and attempts to furniture walk.  Pt required increased time on toilet with hygiene secondary to soft BM.  Min/steady assist ambulation to sink with increased step length.  Hand hygiene and hair brushing completed in standing with supervision.  In therapy gym engaged in 5x sit > stand with hands and without (see below for results). Discussed falls risk.  In therapy kitchen discussed meal prep routine at home and pt stood at counter to locate items  in cabinet that she may use for simple meal prep.  Pt reports feeling "woozy" returned to chair, assessed vitals (BP 107/53 and HR 89) and returned to room where pt left seated up in w/c with all needs in reach.  Five times Sit to Stand Test (FTSS) Method: Use a straight back chair with a solid seat that is 16-18" high. Ask participant to sit on the chair with arms folded across their chest.   Instructions: "Stand up and sit down as quickly as possible 5 times, keeping your arms folded across your chest."   Measurement: Stop timing when the participant stands the 5th time.  TIME: _20.44 seconds (with hands) and 28.12 seconds (without hands)_____ (in seconds)  Times > 13.6 seconds is associated with increased disability and morbidity (Guralnik, 2000) Times > 15 seconds is predictive of recurrent falls in healthy individuals aged 71 and older (Buatois, et al., 2008) Normal performance values in community dwelling individuals aged 17 and older (Bohannon, 2006): o 60-69 years: 11.4 seconds o 70-79 years: 12.6 seconds o 80-89 years: 14.8 seconds  MCID: ? 2.3 seconds for Vestibular Disorders Mariah Milling, 2006)   Therapy Documentation Precautions:  Precautions Precautions: Fall Restrictions Weight Bearing Restrictions: No Pain:  Pt with c/o pain in bilateral thighs, Lt > Rt. Premedicated  See FIM for current functional status  Therapy/Group: Individual Therapy  Simonne Come 09/29/2014, 10:44 AM

## 2014-09-29 NOTE — Progress Notes (Signed)
Physical Therapy Session Note  Patient Details  Name: Diana Hawkins MRN: [id number] Date of Birth: [birth year]  Today's Date: 09/29/2014 PT Individual Time: 1455-1600 PT Individual Time Calculation (min): 65 min   Short Term Goals: Week 1:  PT Short Term Goal 1 (Week 1): STG's = LTG's secondary to anticipated LOS.  Skilled Therapeutic Interventions/Progress Updates:   Pt received getting to EOB and back to supine with OT.  Arrived 5 mins early for session.  Pt states she would like to rest for a few mins before session.  Therefore performed supine therex as follows; ankle pumps x 20 reps, knee presses x 10 reps with tactile cues for correct activation, hip abd x 10 reps (active assist on LLE).  Pt then returned to sitting without bedrails but with HOB slightly elevated as she will have at home.  Performed bed mobility at min/mod A level with cues for log roll technique.  Then focused on BERG balance test.  Scored 22/56, see full details below.  Educated on meaning of score and reason for continuing to work on this.  Assisted pt to therapy gym via w/c.  Ambulated x 15' x 2 with L HHA at min A level.  Continued cues for increased stride length, but note that as we got closer to sitting, steps became more shuffled.  Performed seated nustep x 7 mins at level 1 resistance with BLE/UEs to increase overall strengthening, endurance, LE flexibility, and NMR through LLE.  Requested to use restroom, therefore assisted back to room and to restroom.  Left in restroom, pt states she knows to use call bell and nurse tech made aware.   Therapy Documentation Precautions:  Precautions Precautions: Fall Restrictions Weight Bearing Restrictions: No   Pain: 7/10 L>R hamstring/back of leg pain, RN provided meds during session.     Balance: Balance Balance Assessed: Yes Standardized Balance Assessment Standardized Balance Assessment: Berg Balance Test Berg Balance Test Sit to Stand: Able to stand  independently  using hands Standing Unsupported: Able to stand safely 2 minutes Sitting with Back Unsupported but Feet Supported on Floor or Stool: Able to sit safely and securely 2 minutes Stand to Sit: Controls descent by using hands Transfers: Able to transfer with verbal cueing and /or supervision Standing Unsupported with Eyes Closed: Able to stand 10 seconds with supervision Standing Ubsupported with Feet Together: Needs help to attain position and unable to hold for 15 seconds From Standing, Reach Forward with Outstretched Arm: Reaches forward but needs supervision From Standing Position, Pick up Object from Floor: Unable to try/needs assist to keep balance From Standing Position, Turn to Look Behind Over each Shoulder: Turn sideways only but maintains balance Turn 360 Degrees: Needs assistance while turning Standing Unsupported, Alternately Place Feet on Step/Stool: Needs assistance to keep from falling or unable to try Standing Unsupported, One Foot in Front: Loses balance while stepping or standing Standing on One Leg: Unable to try or needs assist to prevent fall Total Score: 22  See FIM for current functional status  Therapy/Group: Individual Therapy  Denice Bors 09/29/2014, 3:33 PM

## 2014-09-29 NOTE — IPOC Note (Signed)
Overall Plan of Care [facility]) Patient Details Name: Diana Hawkins MRN: [id number] DOB: July 17, 1932  Admitting Diagnosis: [facility] Problems: Principal Problem:   SAH (subarachnoid hemorrhage)     Functional Problem List: Nursing Behavior, Bladder, Bowel, Endurance, Motor, Nutrition, Pain, Perception, Safety, Skin Integrity  PT Balance, Endurance, Pain, Safety, Motor  OT Balance, Endurance, Motor, Pain, Safety  SLP    TR         Basic ADL's: OT Grooming, Bathing, Dressing, Toileting     Advanced  ADL's: OT Simple Meal Preparation, Laundry     Transfers: PT Bed Mobility, Bed to Chair, Car, Furniture, Futures trader, Metallurgist: PT Ambulation, Emergency planning/management officer, Stairs     Additional Impairments: OT None  SLP        TR      Anticipated Outcomes Item Anticipated Outcome  Self Feeding Mod I  Swallowing      Basic self-care  Mod I  Toileting  Mod I   Bathroom Transfers Mod I  Bowel/Bladder  min Assist  Transfers  Mod I  Locomotion  Ambulatory for household distances at [employer] I level; supervision for community mobility  Communication     Cognition     Pain  <3 on a 0-10 scale  Safety/Judgment  min assist   Therapy Plan: PT Intensity: Minimum of 1-2 x/day ,45 to 90 minutes PT Frequency: 5 out of 7 days PT Duration Estimated Length of Stay: 7-10 days OT Intensity: Minimum of 1-2 x/day, 45 to 90 minutes OT Frequency: 5 out of 7 days OT Duration/Estimated Length of Stay: 7-10 days          Team Interventions: Nursing Interventions Patient/Family Education, Bladder Management, Bowel Management, Disease Management/Prevention, Pain Management, Medication Management, Skin Care/Wound Management, Cognitive Remediation/Compensation, Discharge Planning, Psychosocial Support  PT interventions Ambulation/gait training, Balance/vestibular training, Disease management/prevention, Discharge planning, DME/adaptive equipment instruction, Functional  mobility training, Patient/family education, Pain management, Neuromuscular re-education, Therapeutic Exercise, Therapeutic Activities, Wheelchair propulsion/positioning, UE/LE Strength taining/ROM, Stair training  OT Interventions Balance/vestibular training, Discharge planning, DME/adaptive equipment instruction, Functional mobility training, Neuromuscular re-education, Pain management, Patient/family education, Psychosocial support, Self Care/advanced ADL retraining, Therapeutic Activities, Therapeutic Exercise, UE/LE Strength taining/ROM, UE/LE Coordination activities  SLP Interventions    TR Interventions    SW/CM Interventions Discharge Planning, Psychosocial Support, Patient/Family Education    Team Discharge Planning: Destination: PT-  ,OT- Home , SLP-  Projected Follow-up: PT-Outpatient PT, Other (comment) (intermittent supervision), OT-  Outpatient OT, SLP-  Projected Equipment Needs: PT-To be determined, OT- Other (comment) (would recommend grab bars in bathroom), SLP-  Equipment Details: PT-Pt owns personal quad cane., OT-  Patient/family involved in discharge planning: PT- Patient,  OT-Patient, SLP-   MD ELOS: 7-10 days Medical Rehab Prognosis:  Excellent Assessment: 81 year old female with left subarachnoid hemorrhage resulting in decreased balance, decreased memory attention concentration as well as overall weakness. She is now requiring 24 7 rehabilitation M.D., RN as well as CIR level PT, OT, speech therapy. Treatment team will concentrate on memory attention concentration endurance, balance, goals are for Modified independent level    See Team Conference Notes for weekly updates to the plan of care

## 2014-09-29 NOTE — Progress Notes (Signed)
Social Work Elease Hashimoto, LCSW Social Worker Signed  Patient Care Conference 09/27/2014  4:05 PM    Expand All Collapse All   Inpatient RehabilitationTeam Conference and Plan of Care Update Date: 09/27/2014   Time: 11;10 AM     Patient Name: Diana Hawkins       Medical Record Number: [id number]  Date of Birth: January 22, 1932 Sex: Female         Room/Bed: 4W09C/4W09C-01 Payor Info: Payor: MEDICARE / Plan: MEDICARE PART A AND B / Product Type: *No Product type* /    Admitting Diagnosis: SAH  Admit Date/Time:  09/26/2014  5:43 PM Admission Comments: No comment available   Primary Diagnosis:  SAH (subarachnoid hemorrhage) Principal Problem: SAH (subarachnoid hemorrhage)    Patient Active Problem List     Diagnosis  Date Noted   .  SAH (subarachnoid hemorrhage)  09/26/2014   .  Subarachnoid hemorrhage  09/22/2014     Expected Discharge Date: Expected Discharge Date: 10/06/14  Team Members Present: Physician leading conference: Dr. Alysia Penna Social Worker Present: Ovidio Kin, LCSW Nurse Present: Heather Roberts, RN PT Present: Billie Ruddy, Renaye Rakers, PT OT Present: Willeen Cass, OT;Julia Saguier, Jules Schick, OT SLP Present: Windell Moulding, SLP        Current Status/Progress  Goal  Weekly Team Focus   Medical     poor endurance, LLE weak  Home d/c  improve activity tolerance and LLE weakness    Bowel/Bladder       cont B & B         Swallow/Nutrition/ Hydration       na         ADL's     min-mod assist stand pivot transfers, mod assist bathing, min-mod assist dressing.  Pt with pain in LLE impeding mobility and self-care tasks  Mod I overall  transfers, sit <> stand, home making tasks, pt and family education   Mobility     Mod-Max A with bed mobility, Mod A transfers, Min A gait. Limited by LE pain (L>R) and lightheadedness  Mod I with mobility, ambulation for household distances; supervision car transfers and community mobility   Address LE pain and  lightheadedness to increase participation; bed mobility, transfer/gait training, NMR, activity tolerance, dynamic standing balance   Communication       na         Safety/Cognition/ Behavioral Observations      no unsafe behaviors         Pain       monitor legs have been bothering her    monitor her pain in legs      Skin       monitor skin-at this time no issues            *See Care Plan and progress notes for long and short-term goals.    Barriers to Discharge:  See above     Possible Resolutions to Barriers:   Initiate rehab program     Discharge Planning/Teaching Needs:     Home with daughter who is a Public relations account executive and is gone during the day-will need to be mod/i to go home.      Team Discussion:    New eval-mod/i goals for ambulation and supervision for tub and stairs.  No SP orders. Leg pain which feel is hamstring-muscular issues.    Revisions to Treatment Plan:    New eval    Continued Need for Acute Rehabilitation Level of Care: The patient  requires daily medical management by a physician with specialized training in physical medicine and rehabilitation for the following conditions: Daily direction of a multidisciplinary physical rehabilitation program to ensure safe treatment while eliciting the highest outcome that is of practical value to the patient.: Yes Daily medical management of patient stability for increased activity during participation in an intensive rehabilitation regime.: Yes Daily analysis of laboratory values and/or radiology reports with any subsequent need for medication adjustment of medical intervention for : Neurological problems  Elease Hashimoto 09/29/2014, 9:39 AM                  [name], female   DOB: Nov 02, 1931, 81 y.o.   MRN: [id number]

## 2014-09-29 NOTE — Progress Notes (Signed)
81 y.o. right handed female with history of colon cancer 2010 with resection and chronic anemia. Presented 09/21/2014 upon transfer from [facility]. She lives with her daughter and was independent with straight point cane prior to admission but rather sedentary and didn't get out of the house much. Patient with acute onset of headache and altered mental status. CT of the head showed moderate subarachnoid hemorrhage. Negative for posterior communicating artery aneurysm. There is mild hydrocephalus with intraventricular hemorrhage. CT angiogram negative for AVM without evidence of vasospasm. Neurosurgery Dr. Kathyrn Sheriff with conservative care  Subjective/Complaints: Less left lower extremity pain in the thigh as well as the calf. She also has mild right lower extremity pain mainly in the thigh,  Tolerated ADLs but feels tired  Review of Systems - Negative except LLE weakness Objective: Vital Signs: Blood pressure 131/67, pulse 76, temperature 98 F (36.7 C), temperature source Oral, resp. rate 20, height '5\' 5"'  (1.651 m), weight 58.5 kg (128 lb 15.5 oz), SpO2 99 %. No results found. Results for orders placed or performed during the hospital encounter of 09/26/14 (from the past 72 hour(s))  CBC WITH DIFFERENTIAL     Status: Abnormal   Collection Time: 09/27/14  7:14 AM  Result Value Ref Range   WBC 14.3 (H) 4.0 - 10.5 K/uL   RBC 5.16 (H) 3.87 - 5.11 MIL/uL   Hemoglobin 14.7 12.0 - 15.0 g/dL   HCT 43.8 36.0 - 46.0 %   MCV 84.9 78.0 - 100.0 fL   MCH 28.5 26.0 - 34.0 pg   MCHC 33.6 30.0 - 36.0 g/dL   RDW 14.3 11.5 - 15.5 %   Platelets 325 150 - 400 K/uL   Neutrophils Relative % 86 (H) 43 - 77 %   Neutro Abs 12.3 (H) 1.7 - 7.7 K/uL   Lymphocytes Relative 7 (L) 12 - 46 %   Lymphs Abs 1.0 0.7 - 4.0 K/uL   Monocytes Relative 7 3 - 12 %   Monocytes Absolute 1.0 0.1 - 1.0 K/uL   Eosinophils Relative 0 0 - 5 %   Eosinophils Absolute 0.0 0.0 - 0.7 K/uL   Basophils Relative 0 0 - 1 %    Basophils Absolute 0.0 0.0 - 0.1 K/uL  Comprehensive metabolic panel     Status: Abnormal   Collection Time: 09/27/14  7:14 AM  Result Value Ref Range   Sodium 139 135 - 145 mmol/L   Potassium 4.3 3.5 - 5.1 mmol/L   Chloride 104 96 - 112 mmol/L   CO2 22 19 - 32 mmol/L   Glucose, Bld 132 (H) 70 - 99 mg/dL   BUN 21 6 - 23 mg/dL   Creatinine, Ser 0.65 0.50 - 1.10 mg/dL   Calcium 9.2 8.4 - 10.5 mg/dL   Total Protein 7.3 6.0 - 8.3 g/dL   Albumin 3.2 (L) 3.5 - 5.2 g/dL   AST 30 0 - 37 U/L   ALT 37 (H) 0 - 35 U/L   Alkaline Phosphatase 70 39 - 117 U/L   Total Bilirubin 0.7 0.3 - 1.2 mg/dL   GFR calc non Af Amer 82 (L) >90 mL/min   GFR calc Af Amer >90 >90 mL/min    Comment: (NOTE) The eGFR has been calculated using the CKD EPI equation. This calculation has not been validated in all clinical situations. eGFR's persistently <90 mL/min signify possible Chronic Kidney Disease.    Anion gap 13 5 - 15  Glucose, capillary     Status: Abnormal  Collection Time: 09/28/14 11:40 AM  Result Value Ref Range   Glucose-Capillary 125 (H) 70 - 99 mg/dL     HEENT: normal Cardio: RRR and no murmurs Resp: CTA B/L GI: BS positive and NT, ND Extremity:  Pulses positive and No Edema Skin:   Other mild flushing Neuro: Alert/Oriented, Flat and Abnormal Motor Moto 5/5 in bilateral deltoid, biceps, triceps, grip, right hip flexor and knee extensor and ankle dorsal flexor plantar flexor Musc/Skel:  Other no pain with lower extremity range of motion,minimal tenderness to palpation left quadricep, none left hamstring, mild left lower extremity swelling including the. Mild left calf tenderness, minimal right calf and minimal right thigh tenderness No lower extremity erythema Gen. no acute distress   Assessment/Plan: 1. 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 perimesencephalic cistern and left sylvian fissure   which require 3+ hours per day of interdisciplinary therapy in a comprehensive inpatient rehab setting. Physiatrist is providing close team supervision and 24 hour management of active medical problems listed below. Physiatrist and rehab team continue to assess barriers to discharge/monitor patient progress toward functional and medical goals. Screening dopplers today FIM: FIM - Bathing Bathing Steps Patient Completed: Chest, Right Arm, Left Arm, Abdomen, Right upper leg, Left upper leg, Front perineal area, Buttocks Bathing: 5: Set-up assist to: Adjust water temp  FIM - Upper Body Dressing/Undressing Upper body dressing/undressing steps patient completed: Thread/unthread right bra strap, Thread/unthread left bra strap, Hook/unhook bra, Thread/unthread right sleeve of pullover shirt/dresss, Thread/unthread left sleeve of pullover shirt/dress, Put head through opening of pull over shirt/dress, Pull shirt over trunk Upper body dressing/undressing: 5: Set-up assist to: Obtain clothing/put away FIM - Lower Body Dressing/Undressing Lower body dressing/undressing steps patient completed: Thread/unthread right pants leg, Thread/unthread left pants leg, Pull pants up/down, Don/Doff right shoe, Don/Doff left shoe, Fasten/unfasten right shoe, Fasten/unfasten left shoe Lower body dressing/undressing: 5: Set-up assist to: Don/Doff TED stocking  FIM - Toileting Toileting steps completed by patient: Adjust clothing prior to toileting, Performs perineal hygiene, Adjust clothing after toileting Toileting Assistive Devices: Grab bar or rail for support Toileting: 5: Set-up assist to: Obtain supplies  FIM - Radio producer Devices: Grab bars Toilet Transfers: 5-To toilet/BSC: Supervision (verbal cues/safety issues), 5-From toilet/BSC: Supervision (verbal cues/safety issues)  FIM - Control and instrumentation engineer Devices: Bed rails Bed/Chair Transfer: 4: Supine >  Sit: Min A (steadying Pt. > 75%/lift 1 leg), 4: Bed > Chair or W/C: Min A (steadying Pt. > 75%)  FIM - Locomotion: Wheelchair Distance: 10 Locomotion: Wheelchair: 0: Activity did not occur FIM - Locomotion: Ambulation Locomotion: Ambulation Assistive Devices: Other (comment) (L HHA) Ambulation/Gait Assistance: 4: Min assist Locomotion: Ambulation: 1: Travels less than 50 ft with minimal assistance (Pt.>75%)  Comprehension Comprehension Mode: Auditory Comprehension: 6-Follows complex conversation/direction: With extra time/assistive device  Expression Expression Mode: Verbal Expression: 5-Expresses complex 90% of the time/cues < 10% of the time  Social Interaction Social Interaction: 6-Interacts appropriately with others with medication or extra time (anti-anxiety, antidepressant).  Problem Solving Problem Solving: 5-Solves complex 90% of the time/cues < 10% of the time  Memory Memory: 6-Assistive device: No helper  Medical Problem List and Plan: 1. 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  DVT Prophylaxis/Anticoagulation: SCDs. Monitor for any signs of DVT, patient does have lower extremity tenderness no significant swelling however will  check lower extremity venous Dopplers 3. Pain Management: Tylenol as needed 4. Chronic anemia. Follow-up CBC 5. Neuropsych: This patient is capable of making decisions on her own behalf. 6. Skin/Wound Care: OOB, encourage adequate nutrition 7. Fluids/Electrolytes/Nutrition: eating fairly well. BMET pending for the AM 8. Mood/depression. Resume Remeron 15 mg daily at bedtime as prior to admission. Provide emotional support 9. History of colon cancer 2010 10.Hypertension.Nimodipine 60 mg QID   LOS (Days) 3 A FACE TO FACE EVALUATION WAS PERFORMED  KIRSTEINS,ANDREW E 09/29/2014, 10:40 AM

## 2014-09-30 ENCOUNTER — Inpatient Hospital Stay (HOSPITAL_COMMUNITY): Payer: Medicare Other

## 2014-09-30 ENCOUNTER — Inpatient Hospital Stay (HOSPITAL_COMMUNITY): Payer: Medicare Other | Admitting: Speech Pathology

## 2014-09-30 NOTE — Progress Notes (Signed)
Diana Hawkins is a 81 y.o. female [birth year] [id number]  Subjective: No new complaints. No new problems. Slept well. Feeling OK.  Objective: Vital signs in last 24 hours: Temp:  [98.3 F (36.8 C)] 98.3 F (36.8 C) (02/27 0443) Pulse Rate:  [81-89] 81 (02/27 0443) Resp:  [17-18] 17 (02/27 0443) BP: (107-147)/(53-75) 147/75 mmHg (02/27 0443) SpO2:  [98 %-100 %] 98 % (02/27 0443) Weight change:  Last BM Date: 09/28/14  Intake/Output from previous day: 02/26 0701 - 02/27 0700 In: 720 [P.O.:720] Out: -   Physical Exam General: No apparent distress    Lungs: Normal effort. Lungs clear to auscultation, no crackles or wheezes. Cardiovascular: Regular rate and rhythm, no edema Musculoskeletal:  Neurovascularly intact Neurological: No new neurological deficits Wounds: N/A      Lab Results: BMET    Component Value Date/Time   NA 139 09/27/2014 0714   K 4.3 09/27/2014 0714   CL 104 09/27/2014 0714   CO2 22 09/27/2014 0714   GLUCOSE 132* 09/27/2014 0714   BUN 21 09/27/2014 0714   CREATININE 0.65 09/27/2014 0714   CALCIUM 9.2 09/27/2014 0714   GFRNONAA 82* 09/27/2014 0714   GFRAA >90 09/27/2014 0714   CBC    Component Value Date/Time   WBC 14.3* 09/27/2014 0714   RBC 5.16* 09/27/2014 0714   HGB 14.7 09/27/2014 0714   HCT 43.8 09/27/2014 0714   PLT 325 09/27/2014 0714   MCV 84.9 09/27/2014 0714   MCH 28.5 09/27/2014 0714   MCHC 33.6 09/27/2014 0714   RDW 14.3 09/27/2014 0714   LYMPHSABS 1.0 09/27/2014 0714   MONOABS 1.0 09/27/2014 0714   EOSABS 0.0 09/27/2014 0714   BASOSABS 0.0 09/27/2014 0714   CBG's (last 3):   Recent Labs  09/28/14 1140  GLUCAP 125*   LFT's Lab Results  Component Value Date   ALT 37* 09/27/2014   AST 30 09/27/2014   ALKPHOS 70 09/27/2014   BILITOT 0.7 09/27/2014    Studies/Results: No results found.  Medications:  I have reviewed the patient's current medications. Scheduled Medications: . dexamethasone  6 mg Oral 4 times per day   . loratadine  10 mg Oral Daily  . mirtazapine  15 mg Oral QHS  . NiMODipine  60 mg Oral 4 times per day  . polyethylene glycol  17 g Oral Daily   PRN Medications: acetaminophen, HYDROcodone-acetaminophen, ondansetron **OR** ondansetron (ZOFRAN) IV, sorbitol  Assessment/Plan: Principal Problem:   SAH (subarachnoid hemorrhage)  Medical Problem List and Plan: 1. 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 DVT Prophylaxis/Anticoagulation: SCDs. Monitor for any signs of DVT 3. Pain Management: Tylenol as needed 4. Chronic anemia. Follow-up CBC normalized H/H 5. Neuropsych: This patient is capable of making decisions on her own behalf. 6. Skin/Wound Care: OOB, encourage adequate nutrition 7. Fluids/Electrolytes/Nutrition: eating fairly well.  8. Mood/depression. Resume Remeron 15 mg daily at bedtime as prior to admission. Provide emotional support 9. History of colon cancer 2010 10.Hypertension.Nimodipine 60 mg QID  Length of stay, days: 4    Danie Diehl A. Asa Lente, MD 09/30/2014, 9:41 AM

## 2014-09-30 NOTE — Evaluation (Signed)
Speech Language Pathology Assessment and Plan  Patient Details  Name: Diana Hawkins MRN: [id number] Date of Birth: 09/13/1931  SLP Diagnosis: Dysarthria;Cognitive Impairments;Dysphagia  Rehab Potential: Excellent ELOS: 7-10 days    Today's Date: 09/30/2014 SLP Individual Time: 1100-1200 SLP Individual Time Calculation (min): 60 min   Problem List:  Patient Active Problem List   Diagnosis Date Noted  . SAH (subarachnoid hemorrhage) 09/26/2014  . Subarachnoid hemorrhage 09/22/2014   Past Medical History:  Past Medical History  Diagnosis Date  . Hepatitis A 01/1976  . Irregular heartbeat 02/2001    Irregular heartbeat checked  . Anemia 07/2008    extreme anemia   Past Surgical History:  Past Surgical History  Procedure Laterality Date  . Colon surgery  1/10    Stage 3 Cancer  . Breast surgery Right 11/1966    Benign tumor right breast removed  . Abdominal hysterectomy  01/1968    Complete   . Oophorectomy Right 08/1964    Right Ovary Removed, benign tumor  . Cataract extraction w/ intraocular lens implant Bilateral 04/03 L eye; 06/03 R eye  . Pancreatic cyst drainage  03/2012    Benign    Assessment / Plan / Recommendation Clinical Impression Diana Hawkins is a 81 y.o. right handed female with history of colon cancer 2010 with resection and chronic anemia. Presented 09/21/2014 upon transfer from [facility]. She lives with her daughter and was independent with straight point cane prior to admission but rather sedentary and didn't get out of the house much. Patient with acute onset of headache and altered mental status. CT of the head showed moderate subarachnoid hemorrhage. Negative for posterior communicating artery aneurysm. There is mild hydrocephalus with intraventricular hemorrhage. CT angiogram negative for AVM without evidence of vasospasm. Neurosurgery Dr. Kathyrn Sheriff with conservative care. Decadron protocol is indicated. Tolerating a regular diet. Physical  and occupational therapy evaluation completed 09/24/2014 with recommendations of physical medicine rehabilitation consult. Patient would benefit from skilled ST services to increase cognitive function and address dysarthria and swallowing concerns to increase overall functional independence.   Skilled Therapeutic Interventions          Beside swallow evaluation complete with mild oral dysphagia noted. Pt reports difficulties with occasional D4 consistencies (baked potato) and states that some foods are harder to swallow. Throat clear noted x3/4 straw sips of thin liquid. Oral/mtr appear WFL. Weak cough noted. Daughter present and reports that pt has "always had some swallowing difficulties, especially with large pills". Speech-language evaluation complete. Exp/Rec communication, attention and orientation all WFL. Pt noted with mild-mod deficits in memory and complex problem solving ~80% accurate. Pt reports that she was Independent with medications, cooking, cleaning, and finance.   SLP Assessment  Patient will need skilled Speech Lanaguage Pathology Services during CIR admission    Recommendations  Diet Recommendations: Regular;Thin liquid Liquid Administration via: Cup;Straw Medication Administration: Whole meds with puree (pt reports difficulty with large pills) Supervision: Patient able to self feed Compensations: Slow rate;Small sips/bites Postural Changes and/or Swallow Maneuvers: Seated upright 90 degrees Oral Care Recommendations: Oral care BID Patient destination: Home Follow up Recommendations: None Equipment Recommended: None recommended by SLP    SLP Frequency 3 to 5 out of 7 days   SLP Treatment/Interventions Cognitive remediation/compensation;Functional tasks;Patient/family education;Internal/external aids;Cueing hierarchy;Medication managment;Therapeutic Activities;Dysphagia/aspiration precaution training    Pain Pain Assessment Pain Assessment: No/denies pain Pain Score: 2   Pain Location: Leg Pain Orientation: Left;Anterior;Upper Pain Intervention(s): Medication (See eMAR) Prior Functioning Cognitive/Linguistic Baseline: Within functional limits 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  SLP Short Term Goal 2 (Week 1): Pt will utilize external aids to assist with recall of new information with min cues. SLP Short Term Goal 3 (Week 1): Pt will demonstrate functional problem solving for mildly complex tasks with supervision and min verbal cues.  SLP Short Term Goal 4 (Week 1): Patient will utilize speech strategies to increase speech intelligibility at conversation level.   See FIM for current functional status Refer to Care Plan for Long Term Goals  Recommendations for other services: None  Discharge Criteria: Patient will be discharged from SLP if patient refuses treatment 3 consecutive times without medical reason, if treatment goals not met, if there is a change in medical status, if patient makes no progress towards goals or if patient is discharged from hospital.  The above assessment, treatment plan, treatment alternatives and goals were discussed and mutually agreed upon: by patient  Adele Dan 09/30/2014, 4:48 PM

## 2014-09-30 NOTE — Progress Notes (Addendum)
Physical Therapy Session Note  Patient Details  Name: Diana Hawkins MRN: [id number] Date of Birth: [birth year]  Today's Date: 09/30/2014 PT Individual Time: 1355-1445 PT Individual Time Calculation (min): 50 min   Short Term Goals: Week 1:  PT Short Term Goal 1 (Week 1): STG's = LTG's secondary to anticipated LOS.  Skilled Therapeutic Interventions/Progress Updates:  Pt reported L thigh tenderness which appears to be muscular during active hip flexion; no edema or erythema.  Daughter reported that pt had been quite sedentary PTA. PT donned bil thigh high TED hose with pt assisting by mass extension for R and LLEs as hose pulled up.  Due to incontinence of bladder, PT suggested that pt use toilet before leaving room.  See FIM.  Pt did not urinate at all in 5 minutes.  She reported that she self-limits liquids due to her incontinence.  PT educated pt on need for adequate hydration for health and safety. RN informed.  Sit> stand at sink for hand hygiene, close supervision and cues for safety.  Pt tends to pull up on any surface.  neuromuscular re-education in standing with R rail or bil UE support on PT, with demos and VCs for [place] A exs : mini squats, calf raises, hamstring curls, hip abduction.  Also, bil LE eccentric emphasis during sit>< stand without use of bil UEs, pausing x 4 intervals during descent x 5 seconds each, x 2. Pt reported bil LEs felt weak.  Gait x 15' with R rail, min guard assist, cues for upright trunk, forward gaze. In sitting- reaching forward with bil hands focusing on anterior pelvic tilt. W/c propulsion using bil LEs x 25' with supervision.  PT encouraged pt to sit up for an hour or 2 to increase activity tolerance. All needs left within reach, and daughter in room.    Therapy Documentation Precautions:  Precautions Precautions: Fall Restrictions Weight Bearing Restrictions: No   Pain: Pain Assessment Pain Assessment: No/denies pain Pain Score:unable to rate  tenderness; seems to be muscular  Pain Location: Leg Pain Orientation: Left;Anterior;Upper Pain Intervention(s): Medication (See eMAR)   Locomotion : Ambulation Ambulation/Gait Assistance: 4: Min assist    See FIM for current functional status  Therapy/Group: Individual Therapy  Diana Hawkins 09/30/2014, 4:02 PM

## 2014-10-01 ENCOUNTER — Inpatient Hospital Stay (HOSPITAL_COMMUNITY): Payer: PRIVATE HEALTH INSURANCE

## 2014-10-01 ENCOUNTER — Inpatient Hospital Stay (HOSPITAL_COMMUNITY): Payer: Medicare Other | Admitting: *Deleted

## 2014-10-01 ENCOUNTER — Encounter (HOSPITAL_COMMUNITY): Payer: PRIVATE HEALTH INSURANCE

## 2014-10-01 NOTE — Progress Notes (Signed)
Occupational Therapy Note  Patient Details  Name: Sameen Leas MRN: [id number] Date of Birth: 06-30-1932  Today's Date: 10/01/2014 OT Individual Time: 1400-1500 OT Individual Time Calculation (min): 60 min   Pt c/o 6/10 pain in BLE (back of upper legs); RN aware and repositioned; moist heat applied Individual Therapy  Pt sitting in w/c upon arrival and agreeable to therapy.  Pt practiced walk in shower transfers, bed transfers, bed mobility, and functional amb with HHA for home mgmt tasks.  Discuss discharge plans.  Pt lives with daughter.  Pt c/o continued "slight" dizziness with transitional movements.  BP sitting 133/66 and standing 111/54.  Pt fatigues quickly and requires multiple rest breaks.  Pt encouraged that she was able to perform sit<>supine in apt bed without assistance.  Pt returned to room and amb with HHA from door to bed and remained in bed at end of session.    Leotis Shames [facility] 10/01/2014, 3:04 PM

## 2014-10-01 NOTE — Progress Notes (Signed)
Physical Therapy Session Note  Patient Details  Name: Diana Hawkins MRN: [id number] Date of Birth: Jul 10, 1932  Today's Date: 10/01/2014 PT Individual Time: 0755-0855 PT Individual Time Calculation (min): 60 min     Skilled Therapeutic Interventions/Progress Updates:  Patient finishing her breakfast at the beginning of the session, agrees to therapy intervention. Assistance provided for dressing-min A for Upper body dressing and max for lower body including thigh high ted hose.  Transfers in and out of bed with CGA to min A as patient has difficulty progressing LE on/off the bed. W/C propulsion training using only LE,going forward and back with increased need for cues to continue and increase velocity. In supine B LE exercises and stretching techniques, education on self stretching techniques to improve patient function and reduce pain. OTAGO exercises in front of II bars to increase strength and balance.  Gait Training with HHA on a distance 1 x 50 feet 1 x 120 feet with min A adn cues for step length, postural corrections and looking forward.  Patient returned to bed, all needs  Within reach,alarm on. Patient complained of pain in B LE, RN notified and medication administered.( at the beginning of session)  Therapy Documentation Precautions:  Precautions Precautions: Fall Restrictions Weight Bearing Restrictions: No Vital Signs: Therapy Vitals Pulse Rate: 81 BP: (!) 98/50 mmHg Patient Position (if appropriate): Standing   See FIM for current functional status  Therapy/Group: Individual Therapy  [name] 10/01/2014, 11:33 AM

## 2014-10-01 NOTE — Progress Notes (Signed)
Diana Hawkins is a 81 y.o. female 1932/07/01 [id number]  Subjective: No new complaints. No issues overnight. Slept well. Feeling well. Denies pain or breathing problems  Objective: Vital signs in last 24 hours: Temp:  [97.7 F (36.5 C)-98.6 F (37 C)] 97.9 F (36.6 C) (02/28 0534) Pulse Rate:  [66-80] 74 (02/28 0534) Resp:  [16-18] 17 (02/28 0534) BP: (116-140)/(60-85) 140/71 mmHg (02/28 0534) SpO2:  [97 %-100 %] 99 % (02/28 0534) Weight change:  Last BM Date: 09/30/14  Intake/Output from previous day: 02/27 0701 - 02/28 0700 In: 290 [P.O.:290] Out: -   Physical Exam General: No apparent distress   Sitting in WC OOB Lungs: Normal effort. Lungs clear to auscultation, no crackles or wheezes. Cardiovascular: Regular rate and rhythm, no edema Musculoskeletal:  Neurovascularly intact Neurological: No new neurological deficits Wounds: N/A      Lab Results: BMET    Component Value Date/Time   NA 139 09/27/2014 0714   K 4.3 09/27/2014 0714   CL 104 09/27/2014 0714   CO2 22 09/27/2014 0714   GLUCOSE 132* 09/27/2014 0714   BUN 21 09/27/2014 0714   CREATININE 0.65 09/27/2014 0714   CALCIUM 9.2 09/27/2014 0714   GFRNONAA 82* 09/27/2014 0714   GFRAA >90 09/27/2014 0714   CBC    Component Value Date/Time   WBC 14.3* 09/27/2014 0714   RBC 5.16* 09/27/2014 0714   HGB 14.7 09/27/2014 0714   HCT 43.8 09/27/2014 0714   PLT 325 09/27/2014 0714   MCV 84.9 09/27/2014 0714   MCH 28.5 09/27/2014 0714   MCHC 33.6 09/27/2014 0714   RDW 14.3 09/27/2014 0714   LYMPHSABS 1.0 09/27/2014 0714   MONOABS 1.0 09/27/2014 0714   EOSABS 0.0 09/27/2014 0714   BASOSABS 0.0 09/27/2014 0714   CBG's (last 3):    Recent Labs  09/28/14 1140  GLUCAP 125*   LFT's Lab Results  Component Value Date   ALT 37* 09/27/2014   AST 30 09/27/2014   ALKPHOS 70 09/27/2014   BILITOT 0.7 09/27/2014    Studies/Results: No results found.  Medications:  I have reviewed the patient's current  medications. Scheduled Medications: . dexamethasone  6 mg Oral 4 times per day  . loratadine  10 mg Oral Daily  . mirtazapine  15 mg Oral QHS  . NiMODipine  60 mg Oral 4 times per day  . polyethylene glycol  17 g Oral Daily   PRN Medications: acetaminophen, HYDROcodone-acetaminophen, ondansetron **OR** ondansetron (ZOFRAN) IV, sorbitol  Assessment/Plan: Principal Problem:   SAH (subarachnoid hemorrhage)  Medical Problem List and Plan: 1. 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 DVT Prophylaxis/Anticoagulation: SCDs. Monitor for any signs of DVT 3. Pain Management: Tylenol as needed 4. Chronic anemia. Follow-up CBC normalized H/H 5. Neuropsych: This patient is capable of making decisions on her own behalf. 6. Skin/Wound Care: OOB, encourage adequate nutrition 7. Fluids/Electrolytes/Nutrition: eating fairly well.  8. Mood/depression. Resumed Remeron 15 mg daily at bedtime as prior to admission. Provide emotional support 9. History of colon cancer 2010 10.Hypertension. Nimodipine 60 mg QID  Length of stay, days: 5    Valerie A. Asa Lente, MD 10/01/2014, 8:29 AM

## 2014-10-01 NOTE — Plan of Care (Signed)
Problem: RH PAIN MANAGEMENT Goal: RH STG PAIN MANAGED AT OR BELOW PT'S PAIN GOAL Outcome: Progressing <3

## 2014-10-01 NOTE — Progress Notes (Signed)
Occupational Therapy Session Note  Patient Details  Name: Diana Hawkins MRN: [id number] Date of Birth: 10-05-1931  Today's Date: 10/01/2014 OT Individual Time: 1000-1100 OT Individual Time Calculation (min): 60 min    Short Term Goals: Week 1:  OT Short Term Goal 1 (Week 1): STG = LTGs due to ELOS  Skilled Therapeutic Interventions/Progress Updates:    Pt resting in w/c upon arrival.  Pt stated she had already donned clean clothing and didn't want to bathe this morning.  Pt agreeable to therapy.  Pt propelled w/c from room to nurses station with min instructional cues for turning corners.  Pt initially engaged in sit<>stand and standing tasks.  Pt reported slight dizziness/light headedness with standing.  Pt also stated she has had vertigo for years that is noticeable primarily with head turns.  BP 1345/60 sitting and 99/50 standing.  Pt transitioned to endurance/activity tolerance activities on UE Ergometer (10 mins and 5 mins with rest breaks).  Pt returned to room and remained in w/c with daughter present.  Pt encouraged to remain in w/c through lunch.  Pt verbalized that she would try. Focus on activity tolerance, sit<>stand, standing balance, and safety awareness.  Therapy Documentation Precautions:  Precautions Precautions: Fall Restrictions Weight Bearing Restrictions: No   Vital Signs: Therapy Vitals Pulse Rate: 81 BP: (!) 98/50 mmHg Patient Position (if appropriate): Standing Pain: Pain Assessment Pain Assessment: 0-10 Pain Score: 7  Pain Type: Acute pain Pain Location: Leg Pain Orientation: Right;Left Pain Descriptors / Indicators: Aching Pain Onset: On-going Pain Intervention(s): RN made aware  See FIM for current functional status  Therapy/Group: Individual Therapy  Leroy Libman 10/01/2014, 11:02 AM

## 2014-10-02 ENCOUNTER — Inpatient Hospital Stay (HOSPITAL_COMMUNITY): Payer: PRIVATE HEALTH INSURANCE | Admitting: Physical Therapy

## 2014-10-02 ENCOUNTER — Inpatient Hospital Stay (HOSPITAL_COMMUNITY): Payer: Medicare Other | Admitting: Occupational Therapy

## 2014-10-02 ENCOUNTER — Inpatient Hospital Stay (HOSPITAL_COMMUNITY): Payer: Medicare Other | Admitting: Speech Pathology

## 2014-10-02 ENCOUNTER — Encounter (HOSPITAL_COMMUNITY): Payer: PRIVATE HEALTH INSURANCE

## 2014-10-02 DIAGNOSIS — S76302D Unspecified injury of muscle, fascia and tendon of the posterior muscle group at thigh level, left thigh, subsequent encounter: Secondary | ICD-10-CM

## 2014-10-02 MED ORDER — DEXAMETHASONE 4 MG PO TABS
4.0000 mg | ORAL_TABLET | Freq: Three times a day (TID) | ORAL | Status: DC
Start: 2014-10-02 — End: 2014-10-03
  Administered 2014-10-02 – 2014-10-03 (×3): 4 mg via ORAL
  Filled 2014-10-02 (×7): qty 1

## 2014-10-02 NOTE — Plan of Care (Signed)
Problem: RH Floor Transfers Goal: LTG Patient will perform floor transfers w/assist (PT) LTG: Patient will perform floor transfers with assistance (PT).  Outcome: Not Applicable Date Met:  49/17/91 N/A due to slow pt progress, change in D/C plan.

## 2014-10-02 NOTE — Progress Notes (Signed)
Occupational Therapy Session Note  Patient Details  Name: Diana Hawkins MRN: [id number] Date of Birth: [birth year]  Today's Date: 10/02/2014 OT Individual Time: 6803-2122 OT Individual Time Calculation (min): 60 min    Short Term Goals: Week 1:  OT Short Term Goal 1 (Week 1): STG = LTGs due to ELOS  Skilled Therapeutic Interventions/Progress Updates:    Engaged in ADL retraining with focus on functional mobility, transfers, sit <> stand, activity tolerance, and increased participation in LB bathing and dressing.  Pt received in bed reporting pain in bilateral thighs/hamstrings but willing to participation in bathing at shower level this session.  Ambulated with hand held assist to toilet with cues for step length.  Pt completed toileting tasks with supervision, then ambulated to room shower with min/hand held assist.  Bathing completed at sit > stand level with use of grab bar for steady assist when washing buttocks.  Pt reports "woozy" after shower and requesting to return to bed.  Pt laid down in bed for 2-3 mins while discussing activity tolerance, BP 146/64.  Dressing completed seated at EOB with assist to complete LB dressing secondary to difficulty advancing LLE this session.  Pt returned to semi-reclined in bed despite encouragement to sit up in chair.  Therapy Documentation Precautions:  Precautions Precautions: Fall Restrictions Weight Bearing Restrictions: No General:   Vital Signs: Therapy Vitals Pulse Rate: 83 BP: (!) 146/64 mmHg Patient Position (if appropriate): Lying Pain: Pain Assessment Pain Assessment: 0-10 Pain Score: 4  Pain Type: Acute pain Pain Location: Leg Pain Orientation: Left Pain Descriptors / Indicators: Aching Pain Frequency: Constant Pain Onset: On-going Patients Stated Pain Goal: 2 Pain Intervention(s): Medication (See eMAR);Heat applied;Repositioned  See FIM for current functional status  Therapy/Group: Individual Therapy  Simonne Come 10/02/2014, 10:48 AM

## 2014-10-02 NOTE — Plan of Care (Signed)
Problem: RH Furniture Transfers Goal: LTG Patient will perform furniture transfers w/assist (OT/PT LTG: Patient will perform furniture transfers with assistance (OT/PT).  Downgraded due to slow pt progress.

## 2014-10-02 NOTE — Progress Notes (Signed)
Physical Therapy Session Note  Patient Details  Name: Diana Hawkins MRN: [id number] Date of Birth: [birth year]  Today's Date: 10/02/2014 PT Individual Time: 1300-1330 and 1530-1600 PT Individual Time Calculation (min): 30 min and 30 min  Short Term Goals: Week 1:  PT Short Term Goal 1 (Week 1): STG's = LTG's secondary to anticipated LOS.  Skilled Therapeutic Interventions/Progress Updates:    Treatment Session 1: Pt received semi reclined in bed. Pt willing to attempt therapy despite not having had lunch yet (currently awaiting lunch tray). Pt expressing need to use bathroom; therefore, performed supine > sit with rail then sit > stand transfer with min A. Pt ambulated to/from bathroom (approximately 50') with min A, HHA demonstrated short steps, shuffling gait pattern; required frequent cueing to refrain from holding onto furniture, objects during ambulation. Pt transferred to/from toilet with min A using grab bars but unable to urinate at this time. Pt refusing to continue therapy due to feeling "weak and hungry." Therefore, assisted pt back to bed to await arrival of lunch. With sit > supine, pt required min A for LLE management. Departed with pt seated upright in bed with 3 rails up, bed alarm on, and all needs within reach.  Treatment Session 2: Pt received semi reclined in bed; agreeable to therapy. Session focused on activity tolerance and gait training. Pt performed w/c mobility x150' in controlled and home environments with min A for technique with turning and to negotiate door sills. In rehab apartment, pt performed short-distance gait trials (<15') in home environment with rolling walker and min A. Pt performed sit<>stand transfers from apartment couch and bed with cueing for setup, technique with focus on foot positioning and full anterior weight shift. Pt performed supine<>sit with supervision, mod encouragement on rehab apartment bed (HOB flat, no rail). Pt ambulated x76' in controlled and  home environments with min A, L HHA and min cueing for increased bilat step length/clearance due to continued shuffling gait pattern; pt with poor within-session carryover. Returned to room, where pt performed toilet transfer with min A using grab bars. Departed with pt seated in w/c with NT present to assist pt back to bed.  The following long term goals were discharged secondary to change in D/C plan to SNF placement: car transfer, floor transfer, and community ambulation. All other long term goals downgraded to supervision (with the exception of Min A for stairs) secondary to slow pt progress.  Therapy Documentation Precautions:  Precautions Precautions: Fall Restrictions Weight Bearing Restrictions: No Vital Signs: Therapy Vitals Temp: 98.2 F (36.8 C) Temp Source: Oral Pulse Rate: 79 Resp: 18 BP: (!) 128/58 mmHg Patient Position (if appropriate): Lying Oxygen Therapy SpO2: 99 % O2 Device: Not Delivered Pain: Pain Assessment Pain Assessment: 0-10 No/denies pain Locomotion : Ambulation Ambulation/Gait Assistance: 4: Min assist   See FIM for current functional status  Therapy/Group: Individual Therapy  Stefano Gaul 10/02/2014, 6:33 PM

## 2014-10-02 NOTE — Discharge Instructions (Signed)
Inpatient Rehab Discharge Instructions  Diana Hawkins Discharge date and time: No discharge date for patient encounter.   Activities/Precautions/ Functional Status: Activity: activity as tolerated Diet: regular diet Wound Care: none needed Functional status:  ___ No restrictions     ___ Walk up steps independently ___ 24/7 supervision/assistance   ___ Walk up steps with assistance ___ Intermittent supervision/assistance  ___ Bathe/dress independently ___ Walk with walker     ___ Bathe/dress with assistance ___ Walk Independently    ___ Shower independently _x__ Walk with assistance    ___ Shower with assistance ___ No alcohol     ___ Return to work/school ________  Special Instructions:    My questions have been answered and I understand these instructions. I will adhere to these goals and the provided educational materials after my discharge from the hospital.  Patient/Caregiver Signature _______________________________ Date __________  Clinician Signature _______________________________________ Date __________  Please bring this form and your medication list with you to all your follow-up doctor's appointments.

## 2014-10-02 NOTE — Progress Notes (Signed)
Speech Language Pathology Daily Session Note  Patient Details  Name: Diana Hawkins MRN: [id number] Date of Birth: 07/04/1932  Today's Date: 10/02/2014 SLP Individual Time: 1400-1445 SLP Individual Time Calculation (min): 45 min and  Today's Date: 10/02/2014 SLP Missed Time: 15 Minutes Missed Time Reason: Pain  Short Term Goals: Week 1: SLP Short Term Goal 1 (Week 1): Pt will consume current diet with no obervable s/s of aspiration, min A to utilize swallow strategies.  SLP Short Term Goal 2 (Week 1): Pt will utilize external aids to assist with recall of new information with min cues. SLP Short Term Goal 3 (Week 1): Pt will demonstrate functional problem solving for mildly complex tasks with supervision and min verbal cues.  SLP Short Term Goal 4 (Week 1): Patient will utilize speech strategies to increase speech intelligibility at conversation level.   Skilled Therapeutic Interventions: Skilled treatment session focused on addressing cognitive goals.  SLP facilitated session with Max encouragement for participation in therapy session due to patient's report of pain.  SLP educated patient on need for pain medication to allow for participation in therapies; patient declined medication but was agreeable to therapy participation.  SLP facilitated session with basic money management task and Mod clinician cues to self-identify errors and Mod faded to [place] verbal cues to correct them.  Patient reported that the task was difficult because of her pain, accepted pain medication and then declined further participation so she could ret in bed and let the medicine take effect.  Continue with current plan of care.      FIM:  Comprehension Comprehension Mode: Auditory Comprehension: 5-Understands basic 90% of the time/requires cueing < 10% of the time Expression Expression Mode: Verbal Expression: 6-Expresses complex ideas: With extra time/assistive device Social Interaction Social Interaction:  4-Interacts appropriately 75 - 89% of the time - Needs redirection for appropriate language or to initiate interaction. Problem Solving Problem Solving: 5-Solves basic 90% of the time/requires cueing < 10% of the time Memory Memory: 4-Recognizes or recalls 75 - 89% of the time/requires cueing 10 - 24% of the time  Pain Pain Assessment Pain Assessment: 0-10 Pain Score: 6  Pain Type: Acute pain Pain Location: Leg Pain Orientation: Left Pain Descriptors / Indicators: Aching Pain Frequency: Constant Pain Onset: On-going Patients Stated Pain Goal: 2 Pain Intervention(s): RN made aware;Repositioned;Elevated extremity Multiple Pain Sites: No  Therapy/Group: Individual Therapy  Carmelia Roller., Branch [phone]  Jay 10/02/2014, 6:28 PM

## 2014-10-02 NOTE — Progress Notes (Signed)
Social Work [name], female   DOB: [birth year], 81 y.o.   MRN: [id number] Spoke with daughter-Shelley via telephone to discuss discharge plan.  Daughter feels mom is not able to return home with her at discharge. Her concern is she is gone much of the time and she needs to go to a NH or Assisted Living facility form rehab.  She has spoken with Mom and she is agreeable to this. Have left facility list in pt's room for daughter she will look at [facility] and get back with this worker regarding her choices.  The plan is to go Skilled for the rehab component and then Transition to Assisting Living once rehab is completed. Left list in room for daughter.

## 2014-10-02 NOTE — Plan of Care (Signed)
Problem: RH Ambulation Goal: LTG Patient will ambulate in community environment (PT) LTG: Patient will ambulate in community environment, # of feet with assistance (PT).  Outcome: Not Applicable Date Met:  54/56/25 N/A secondary to change in D/C plan to SNF placement.

## 2014-10-02 NOTE — Plan of Care (Signed)
Problem: RH Bed to Chair Transfers Goal: LTG Patient will perform bed/chair transfers w/assist (PT) LTG: Patient will perform bed/chair transfers with assistance, with/without cues (PT).  Downgraded secondary to slow pt progress.

## 2014-10-02 NOTE — Plan of Care (Signed)
Problem: RH Balance Goal: LTG Patient will maintain dynamic standing balance (PT) LTG: Patient will maintain dynamic standing balance with assistance during mobility activities (PT)  Downgraded secondary to slow pt progress.  Problem: RH Bed Mobility Goal: LTG Patient will perform bed mobility with assist (PT) LTG: Patient will perform bed mobility with assistance, with/without cues (PT).  Downgraded secondary to slow pt progress.  Problem: RH Ambulation Goal: LTG Patient will ambulate in controlled environment (PT) LTG: Patient will ambulate in a controlled environment, # of feet with assistance (PT).  Downgraded secondary to slow pt progress. Goal: LTG Patient will ambulate in home environment (PT) LTG: Patient will ambulate in home environment, # of feet with assistance (PT).  Downgraded secondary to slow pt progress.

## 2014-10-02 NOTE — Plan of Care (Signed)
Problem: RH Car Transfers Goal: LTG Patient will perform car transfers with assist (PT) LTG: Patient will perform car transfers with assistance (PT).  Outcome: Not Applicable Date Met:  19/59/74 N/A secondary to change in D/C plan to SNF placement.

## 2014-10-02 NOTE — Plan of Care (Signed)
Problem: RH Stairs Goal: LTG Patient will ambulate up and down stairs w/assist (PT) LTG: Patient will ambulate up and down # of stairs with assistance (PT)  Downgraded secondary to slow pt progress.

## 2014-10-02 NOTE — Progress Notes (Signed)
81 y.o. right handed female with history of colon cancer 2010 with resection and chronic anemia. Presented 09/21/2014 upon transfer from [facility]. She lives with her daughter and was independent with straight point cane prior to admission but rather sedentary and didn't get out of the house much. Patient with acute onset of headache and altered mental status. CT of the head showed moderate subarachnoid hemorrhage. Negative for posterior communicating artery aneurysm. There is mild hydrocephalus with intraventricular hemorrhage. CT angiogram negative for AVM without evidence of vasospasm. Neurosurgery Dr. Kathyrn Sheriff with conservative care  Subjective/Complaints: Overall LLE improving but has Left thigh ant post and lateral pain  Review of Systems - Negative except LLE weakness Objective: Vital Signs: Blood pressure 136/72, pulse 71, temperature 98.1 F (36.7 C), temperature source Oral, resp. rate 17, height 5\' 5"  (1.651 m), weight 58.5 kg (128 lb 15.5 oz), SpO2 98 %. No results found. No results found for this or any previous visit (from the past 72 hour(s)).   HEENT: normal Cardio: RRR and no murmurs Resp: CTA B/L GI: BS positive and NT, ND Extremity:  Pulses positive and No Edema Skin:   Other mild flushing Neuro: Alert/Oriented, Flat and Abnormal Motor Moto 5/5 in bilateral deltoid, biceps, triceps, grip, right hip flexor and knee extensor and ankle dorsal flexor plantar flexor Musc/Skel:  Other no pain with lower extremity range of motion,minimal tenderness to palpation left quadricep, none left hamstring, mild left lower extremity swelling including the. Mild left calf tenderness, minimal right calf and minimal right thigh tenderness No lower extremity erythema Gen. no acute distress   Assessment/Plan: 1. 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 Physiatrist is providing close team supervision and 24 hour management of active medical problems listed below. Physiatrist and rehab team continue to assess barriers to discharge/monitor patient progress toward functional and medical goals.  FIM: FIM - Bathing Bathing Steps Patient Completed: Chest, Right Arm, Left Arm, Abdomen, Right upper leg, Left upper leg, Front perineal area, Buttocks Bathing: 5: Set-up assist to: Adjust water temp  FIM - Upper Body Dressing/Undressing Upper body dressing/undressing steps patient completed: Thread/unthread right bra strap, Thread/unthread left bra strap, Hook/unhook bra, Thread/unthread right sleeve of pullover shirt/dresss, Thread/unthread left sleeve of pullover shirt/dress, Put head through opening of pull over shirt/dress, Pull shirt over trunk Upper body dressing/undressing: 5: Set-up assist to: Obtain clothing/put away FIM - Lower Body Dressing/Undressing Lower body dressing/undressing steps patient completed: Thread/unthread right pants leg, Thread/unthread left pants leg, Pull pants up/down, Don/Doff right shoe, Don/Doff left shoe, Fasten/unfasten right shoe, Fasten/unfasten left shoe Lower body dressing/undressing: 5: Set-up assist to: Don/Doff TED stocking  FIM - Toileting Toileting steps completed by patient: Performs perineal hygiene, Adjust clothing prior to toileting Toileting Assistive Devices: Grab bar or rail for support Toileting: 4: Steadying assist  FIM - Radio producer Devices: Grab bars Toilet Transfers: 5-To toilet/BSC: Supervision (verbal cues/safety issues), 5-From toilet/BSC: Supervision (verbal cues/safety issues)  FIM - Control and instrumentation engineer Devices: Bed rails Bed/Chair Transfer: 5: Supine > Sit: Supervision (verbal cues/safety issues), 4: Bed  > Chair or W/C: Min A (steadying Pt. > 75%)  FIM - Locomotion: Wheelchair Distance: 10 Locomotion: Wheelchair: 1: Travels less than 50 ft with supervision, cueing or coaxing FIM - Locomotion: Ambulation Locomotion: Ambulation Assistive Devices:  Other (comment) (rail in hall with R hand) Ambulation/Gait Assistance: 4: Min assist Locomotion: Ambulation: 1: Travels less than 50 ft with minimal assistance (Pt.>75%)  Comprehension Comprehension Mode: Auditory Comprehension: 6-Follows complex conversation/direction: With extra time/assistive device  Expression Expression Mode: Verbal Expression: 6-Expresses complex ideas: With extra time/assistive device  Social Interaction Social Interaction: 6-Interacts appropriately with others with medication or extra time (anti-anxiety, antidepressant).  Problem Solving Problem Solving: 5-Solves basic 90% of the time/requires cueing < 10% of the time  Memory Memory: 4-Recognizes or recalls 75 - 89% of the time/requires cueing 10 - 24% of the time  Medical Problem List and Plan: 1. 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  DVT Prophylaxis/Anticoagulation: SCDs. Monitor for any signs of DVT, patient does have lower extremity tenderness no significant swelling however will check lower extremity venous Dopplers 3. Pain Management: Tylenol as needed 4. Chronic anemia. Follow-up CBC 5. Neuropsych: This patient is capable of making decisions on her own behalf. 6. Skin/Wound Care: OOB, encourage adequate nutrition 7. Fluids/Electrolytes/Nutrition: eating fairly well. BMET pending for the AM 8. Mood/depression. Resume Remeron 15 mg daily at bedtime as prior to admission. Provide emotional support 9. History of colon cancer 2010 10.Hypertension.Nimodipine 60 mg QID   LOS (Days) 6 A FACE TO FACE EVALUATION  WAS PERFORMED  Diana Hawkins E 10/02/2014, 8:57 AM

## 2014-10-03 ENCOUNTER — Inpatient Hospital Stay (HOSPITAL_COMMUNITY): Payer: PRIVATE HEALTH INSURANCE | Admitting: Speech Pathology

## 2014-10-03 ENCOUNTER — Inpatient Hospital Stay (HOSPITAL_COMMUNITY): Payer: Medicare Other | Admitting: Occupational Therapy

## 2014-10-03 ENCOUNTER — Inpatient Hospital Stay (HOSPITAL_COMMUNITY): Payer: PRIVATE HEALTH INSURANCE | Admitting: Physical Therapy

## 2014-10-03 DIAGNOSIS — M7061 Trochanteric bursitis, right hip: Secondary | ICD-10-CM

## 2014-10-03 MED ORDER — LIDOCAINE HCL (PF) 1 % IJ SOLN
20.0000 mL | Freq: Once | INTRAMUSCULAR | Status: AC
  Administered 2014-10-04: 4 mL
  Filled 2014-10-03: qty 20

## 2014-10-03 MED ORDER — DEXAMETHASONE 2 MG PO TABS
2.0000 mg | ORAL_TABLET | Freq: Three times a day (TID) | ORAL | Status: DC
  Administered 2014-10-03 – 2014-10-04 (×3): 2 mg via ORAL
  Filled 2014-10-03 (×6): qty 1

## 2014-10-03 MED ORDER — LIDOCAINE HCL 1 % IJ SOLN
20.0000 mL | Freq: Once | INTRAMUSCULAR | Status: DC
  Filled 2014-10-03: qty 20

## 2014-10-03 MED ORDER — BETAMETHASONE SOD PHOS & ACET 6 (3-3) MG/ML IJ SUSP
12.0000 mg | Freq: Once | INTRAMUSCULAR | Status: AC
  Administered 2014-10-04: 6 mg via INTRAMUSCULAR
  Filled 2014-10-03: qty 2

## 2014-10-03 NOTE — Progress Notes (Signed)
Physical Therapy Session Note  Patient Details  Name: Diana Hawkins MRN: [id number] Date of Birth: 01-26-1932  Today's Date: 10/03/2014 PT Individual Time: 1300-1400 PT Individual Time Calculation (min): 60 min   Short Term Goals: Week 1:  PT Short Term Goal 1 (Week 1): STG's = LTG's secondary to anticipated LOS.  Skilled Therapeutic Interventions/Progress Updates:    Pt received semi reclined in bed; agreeable to therapy. Session focused on activity tolerance, stair negotiation, and gait training. Pt performed supine > sit with HOB flat and no rail requiring min A, verbal/tactile cueing for technique, to initiate transition from supine > L side lying. Pt performed squat pivot transfer from bed > w/c with supervision, max cueing for setup, w/c parts management. Pt performed w/c mobility x150' in controlled environment with bilat UE's and min A for technique with turning, ineffective within-session carryover. In treatment gym, pt negotiated 8 stairs with step-to pattern; initial 5 stairs forward-facing with bilat rails requiring min A and final 4 stairs laterally with bilat UE support at R rail requiring min guard. At this time, pt requesting to return to room secondary to chest pain; however, pt pointing to epigastric area when describing pain. Vitals WNL; see below for detailed readings. RN notified of pt symptoms. Upon returning to pt room, pt requesting to use bathroom. Therefore, ambulated to/from bathroom with min A, L HHA with cueing for increased bilat step length with inconsistent within-session carryover. Pt performed toilet transfer with grab bar and min A. Pt requesting to return to bed at this time. Therefore, pt ambulated back to bed and performed stand > sit with min A to control descent. Pt then performed sit > supine with HOB flat, no rails with min A for LLE management. Departed with pt semi reclined in bed with 3 rails up, bed alarm on, RN present, and all needs within reach.  Downgraded  long term goal for stair negotiation due to slow pt progress.  Therapy Documentation Precautions:  Precautions Precautions: Fall Restrictions Weight Bearing Restrictions: No Vital Signs: Therapy Vitals Temp: 98.3 F (36.8 C) Temp Source: Oral Pulse Rate: 87 Resp: 12 BP: (!) 130/57 mmHg Patient Position (if appropriate): Sitting Oxygen Therapy SpO2: 100 % O2 Device: Not Delivered Pain: Pain Assessment Pain Assessment: Faces Faces Pain Scale: Hurts little more Pain Type: Acute pain Pain Location: Leg Pain Orientation: Left Pain Descriptors / Indicators: Aching Pain Onset: Unable to tell Pain Intervention(s): Heat applied;Repositioned Locomotion : Ambulation Ambulation/Gait Assistance: 4: Min assist Wheelchair Mobility Distance: 150   See FIM for current functional status  Therapy/Group: Individual Therapy  [place]ble, Malva Cogan 10/03/2014, 4:40 PM

## 2014-10-03 NOTE — Consult Note (Signed)
NEUROBEHAVIORAL STATUS EXAM - [facility]   MEDICAL NECESSITY:  Everline Mahaffy was seen on the [facility] for a neurobehavioral status exam owing to the patient's diagnosis of cerebral infarction, and to assist in treatment planning during admission.   According to medical records, Mrs. Kehm was admitted to the rehab unit owing to "Functional deficits secondary to nontraumatic SAH in the left>right suprasellar cisterns with extension." Records also indicate that she "is a 81 y.o. right handed female with history of colon cancer 2010 with resection and chronic anemia. Presented 09/21/2014 upon transfer from [facility]. She lives with her daughter and was independent with straight point cane prior to admission but rather sedentary and didn't get out of the house much. Patient with acute onset of headache and altered mental status. CT of the head showed moderate subarachnoid hemorrhage. Negative for posterior communicating artery aneurysm. There is mild hydrocephalus with intraventricular hemorrhage. CT angiogram negative for AVM without evidence of vasospasm. Neurosurgery Dr. Kathyrn Sheriff with conservative care. Decadron protocol is indicated."   During today's visit, Mrs. Ryall reported suffering from mild memory issues that were present prior to the stroke but have somewhat worsened since. She experiences the most motoric issues with her left leg. No other cognitive, motor or sensory symptoms endorsed.  Of note, social work mentioned that the patient has a history of "mild dementia." Patient is not aware of being treated for a memory problem.   From an emotional standpoint, Mrs. Grindle reported experiencing mild (and intermittent) depressive symptoms in light of her present medical situation but nothing that seems severe enough (or frequent enough) to warrant a diagnosis. Her biggest complaint was the slow rate of progress  that she is noticing, as she was hoping to be a lot better by now. Patient has no history of mental health issues or treatment. No adjustment issues endorsed. Suicidal/homicidal ideation, plan or intent was denied. No manic or hypomanic episodes were reported. The patient denied ever experiencing any auditory/visual hallucinations. No major behavioral or personality changes were endorsed.  Mrs. Riehle believes that she is making some progress in therapy; albeit slow. She described the rehab staff as "good." No barriers to therapy identified. Mrs. Notaro was living with her daughter, though she said that she is at home alone for most of the day because her daughter works. She feels comfortable with this living situation.    PROCEDURES ADMINISTERED: [2 units 96116] Diagnostic clinical interview  Review of available records Mental Status Exam-2 (brief version)  MENTAL STATUS: Mrs. Gunderman' mental status exam score of 15/16 is above the cutoff used to indicate severe cognitive impairment or frank dementia. The only item she missed was misidentifying the season as spring instead of winter.    Behavioral Evaluation: Mrs. Chatwin was appropriately dressed for season and situation, and she appeared tidy and well-groomed. Normal posture was noted. She was friendly and rapport was adequately established. Her speech was as expected and she was able to express ideas effectively. She seemed to understand test directions readily. Her affect was somewhat blunted. Attention and motivation were good. Optimal test taking conditions were maintained.   IMPRESSION: Overall, Mrs. Mcnorton reports mild memory disruption that was present prior to the stroke. Simple mental status exam was intact, but may not be sensitive enough to pick up on more mild deficits.  No major emotional factors present beyond normal frustration with her slow recovery process. Stroke recovery education was provided. At this time, no formal  follow-up with  neuropsychology warranted unless requested by patient or staff. I will consider more thorough neuropsychological screen prior to discharge.   DIAGNOSIS:  Cerebral infarction   Rutha Bouchard, Psy.D.  Clinical Neuropsychologist

## 2014-10-03 NOTE — Progress Notes (Signed)
Speech Language Pathology Daily Session Note  Patient Details  Name: Diana Hawkins MRN: [id number] Date of Birth: 1931/08/11  Today's Date: 10/03/2014 SLP Individual Time: 0947-0962 SLP Individual Time Calculation (min): 65 min  Short Term Goals: Week 1: SLP Short Term Goal 1 (Week 1): Pt will consume current diet with no obervable s/s of aspiration, min A to utilize swallow strategies.  SLP Short Term Goal 2 (Week 1): Pt will utilize external aids to assist with recall of new information with min cues. SLP Short Term Goal 3 (Week 1): Pt will demonstrate functional problem solving for mildly complex tasks with supervision and min verbal cues.  SLP Short Term Goal 4 (Week 1): Patient will utilize speech strategies to increase speech intelligibility at conversation level.   Skilled Therapeutic Interventions:  Pt was seen for skilled ST targeting cognitive goals.   Pt was received from PT session, reclined in bed, awake, lethargic, but agreeable to participate in [place].  Pt recalled activities from yesterday's therapy session with supervision question cues during functional conversations with SLP related to pt's current goals and progress in [place].  SLP then facilitated the session with a basic new learning activity targeting planning, organization, and error awareness.  Pt required overall mod assist for recall of task procedures during the abovementioned activity; min assist for functional problem solving.  At the end of the session, pt anticipated needing to use the restroom with supervision question cues; therefore, SLP assisted pt to the bathroom.  Pt then utilized the call bell appropriately to indicate to the SLP that she was finished, at which point the SLP assisted pt back to bed.  Continue per current plan of care.   FIM:  Comprehension Comprehension Mode: Auditory Comprehension: 5-Understands basic 90% of the time/requires cueing < 10% of the time Expression Expression Mode: Verbal Expression:  5-Expresses basic needs/ideas: With extra time/assistive device Social Interaction Social Interaction: 5-Interacts appropriately 90% of the time - Needs monitoring or encouragement for participation or interaction. Problem Solving Problem Solving: 4-Solves basic 75 - 89% of the time/requires cueing 10 - 24% of the time Memory Memory: 4-Recognizes or recalls 75 - 89% of the time/requires cueing 10 - 24% of the time  Pain Pain Assessment Pain Assessment: Faces Faces Pain Scale: Hurts little more Pain Type: Acute pain Pain Location: Leg Pain Orientation: Left Pain Descriptors / Indicators: Aching Pain Intervention(s): Heat applied;Repositioned  Therapy/Group: Individual Therapy  Halim Surrette, Selinda Orion 10/03/2014, 3:19 PM

## 2014-10-03 NOTE — Plan of Care (Signed)
Problem: RH Stairs Goal: LTG Patient will ambulate up and down stairs w/assist (PT) LTG: Patient will ambulate up and down # of stairs with assistance (PT)  Downgraded secondary to slow pt progress.

## 2014-10-03 NOTE — Plan of Care (Signed)
Problem: RH Balance Goal: LTG: Patient will maintain dynamic sitting balance (OT) LTG: Patient will maintain dynamic sitting balance with assistance during activities of daily living (OT)  Downgraded due to slow progress and inconsistency Goal: LTG Patient will maintain dynamic standing with ADLs (OT) LTG: Patient will maintain dynamic standing balance with assist during activities of daily living (OT)  Downgraded due to slow progress and inconsistency  Problem: RH Bathing Goal: LTG Patient will bathe with assist, cues/equipment (OT) LTG: Patient will bathe specified number of body parts with assist with/without cues using equipment (position) (OT)  Downgraded due to slow progress and inconsistency  Problem: RH Dressing Goal: LTG Patient will perform upper body dressing (OT) LTG Patient will perform upper body dressing with assist, with/without cues (OT).  Downgraded due to slow progress and inconsistency Goal: LTG Patient will perform lower body dressing w/assist (OT) LTG: Patient will perform lower body dressing with assist, with/without cues in positioning using equipment (OT)  Downgraded due to slow progress and inconsistency  Problem: RH Toileting Goal: LTG Patient will perform toileting w/assist, cues/equip (OT) LTG: Patient will perform toiletiing (clothes management/hygiene) with assist, with/without cues using equipment (OT)  Downgraded due to slow progress and inconsistency  Problem: RH Simple Meal Prep Goal: LTG Patient will perform simple meal prep w/assist (OT) LTG: Patient will perform simple meal prep with assistance, with/without cues (OT).  Outcome: Not Applicable Date Met:  92/44/62 D/C due to change in discharge plan, now SNF     Problem: RH Laundry Goal: LTG Patient will perform laundry w/assist, cues (OT) LTG: Patient will perform laundry with assistance, with/without cues (OT).  Outcome: Not Applicable Date Met:  86/38/17 D/C due to change in discharge plan,  now SNF     Problem: RH Toilet Transfers Goal: LTG Patient will perform toilet transfers w/assist (OT) LTG: Patient will perform toilet transfers with assist, with/without cues using equipment (OT)  Downgraded due to slow progress and inconsistency  Problem: RH Tub/Shower Transfers Goal: LTG Patient will perform tub/shower transfers w/assist (OT) LTG: Patient will perform tub/shower transfers with assist, with/without cues using equipment (OT)  Downgraded due to slow progress and inconsistency

## 2014-10-03 NOTE — Progress Notes (Signed)
Occupational Therapy Session Note  Patient Details  Name: Diana Hawkins MRN: [id number] Date of Birth: January 15, 1932  Today's Date: 10/03/2014 OT Individual Time: 0737-1062 OT Individual Time Calculation (min): 60 min    Short Term Goals: Week 1:  OT Short Term Goal 1 (Week 1): STG = LTGs due to ELOS  Skilled Therapeutic Interventions/Progress Updates:    Engaged in ADL retraining with focus on increased participation, activity tolerance, functional mobility and transfers, and self-care retraining.  Pt received in bed reporting feeling "dopey" and "groggy" but willing to attempt to participate.  UB dressing completed seated at EOB with pt reporting feeling "woozy" and requesting to return to supine, educated on increased upright and OOB tolerance with pt insisting on returning to bed.  Ambulated to toilet with hand held assist after brief rest break.  Pt completed toileting tasks with setup assist.  Returned to w/c post toileting and donned pants with max encouragement and setup assist, utilized step stool to attempt to decrease hamstring pain while pt thread Lt pant leg. Pt with increased c/o pain in BLE when seated in w/c, returned to bed where pt engaged in ankle pumps and heel slides to attempt to decrease hamstring pain.    Therapy Documentation Precautions:  Precautions Precautions: Fall Restrictions Weight Bearing Restrictions: No Pain: Pain Assessment Pain Assessment: 0-10 Pain Score: 2   See FIM for current functional status  Therapy/Group: Individual Therapy  Simonne Come 10/03/2014, 10:47 AM

## 2014-10-03 NOTE — Progress Notes (Signed)
81 y.o. right handed female with history of colon cancer 2010 with resection and chronic anemia. Presented 09/21/2014 upon transfer from [facility]. She lives with her daughter and was independent with straight point cane prior to admission but rather sedentary and didn't get out of the house much. Patient with acute onset of headache and altered mental status. CT of the head showed moderate subarachnoid hemorrhage. Negative for posterior communicating artery aneurysm. There is mild hydrocephalus with intraventricular hemorrhage. CT angiogram negative for AVM without evidence of vasospasm. Neurosurgery Dr. Kathyrn Sheriff with conservative care  Subjective/Complaints: Overall LLE improving at rest but still problems in therapy  Review of Systems - Negative except LLE weakness Objective: Vital Signs: Blood pressure 133/59, pulse 66, temperature 98.8 F (37.1 C), temperature source Oral, resp. rate 17, height 5\' 5"  (1.651 m), weight 58.5 kg (128 lb 15.5 oz), SpO2 98 %. No results found. No results found for this or any previous visit (from the past 72 hour(s)).   HEENT: normal Cardio: RRR and no murmurs Resp: CTA B/L GI: BS positive and NT, ND Extremity:  Pulses positive and No Edema Skin:   Other mild flushing Neuro: Alert/Oriented, Flat and Abnormal Motor Moto 5/5 in bilateral deltoid, biceps, triceps, grip, right hip flexor and knee extensor and ankle dorsal flexor plantar flexor Musc/Skel:  Other no pain with lower extremity range of motion,minimal tenderness to palpation left quadricep, none left hamstring, mild left lower extremity swelling including the. Mild left calf tenderness, minimal right calf and minimal right thigh tenderness No lower extremity erythema Gen. no acute distress   Assessment/Plan: 1. 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 Physiatrist is providing close team supervision and 24 hour management of active medical problems listed below. Physiatrist and rehab team continue to assess barriers to discharge/monitor patient progress toward functional and medical goals.  FIM: FIM - Bathing Bathing Steps Patient Completed: Chest, Right Arm, Left Arm, Abdomen, Right upper leg, Left upper leg, Front perineal area, Buttocks Bathing: 5: Set-up assist to: Adjust water temp  FIM - Upper Body Dressing/Undressing Upper body dressing/undressing steps patient completed: Thread/unthread right bra strap, Thread/unthread left bra strap, Hook/unhook bra, Thread/unthread right sleeve of pullover shirt/dresss, Thread/unthread left sleeve of pullover shirt/dress, Put head through opening of pull over shirt/dress, Pull shirt over trunk Upper body dressing/undressing: 5: Set-up assist to: Obtain clothing/put away FIM - Lower Body Dressing/Undressing Lower body dressing/undressing steps patient completed: Thread/unthread right pants leg, Pull pants up/down, Don/Doff right sock Lower body dressing/undressing: 3: Mod-Patient completed 50-74% of tasks  FIM - Toileting Toileting steps completed by patient: Performs perineal hygiene, Adjust clothing prior to toileting, Adjust clothing after toileting Toileting Assistive Devices: Grab bar or rail for support Toileting: 5: Supervision: Safety issues/verbal cues  FIM - Radio producer Devices: Grab bars Toilet Transfers: 4-To toilet/BSC: Min A (steadying Pt. > 75%), 4-From toilet/BSC: Min A (steadying Pt. > 75%)  FIM - Bed/Chair Transfer Bed/Chair Transfer Assistive Devices: Arm rests Bed/Chair Transfer: 5: Supine > Sit: Supervision (verbal cues/safety issues), 5: Sit > Supine: Supervision (verbal cues/safety issues), 4: Bed > Chair or W/C: Min A  (steadying Pt. > 75%), 4: Chair or W/C > Bed: Min A (steadying Pt. > 75%)  FIM - Locomotion: Wheelchair Distance: 150 Locomotion: Wheelchair: 4: Travels 150 ft or  more: maneuvers on rugs and over door sillls with minimal assistance (Pt.>75%) FIM - Locomotion: Ambulation Locomotion: Ambulation Assistive Devices: Other (comment) (L HHA) Ambulation/Gait Assistance: 4: Min assist Locomotion: Ambulation: 2: Travels 50 - 149 ft with minimal assistance (Pt.>75%)  Comprehension Comprehension Mode: Auditory Comprehension: 5-Understands basic 90% of the time/requires cueing < 10% of the time  Expression Expression Mode: Verbal Expression: 5-Expresses basic needs/ideas: With extra time/assistive device  Social Interaction Social Interaction: 4-Interacts appropriately 75 - 89% of the time - Needs redirection for appropriate language or to initiate interaction.  Problem Solving Problem Solving: 5-Solves basic 90% of the time/requires cueing < 10% of the time  Memory Memory: 4-Recognizes or recalls 75 - 89% of the time/requires cueing 10 - 24% of the time  Medical Problem List and Plan: 1. 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  DVT Prophylaxis/Anticoagulation: SCDs. Monitor for any signs of DVT, patient does have lower extremity tenderness no significant swelling however will check lower extremity venous Dopplers 3. Pain Management: Tylenol as needed 4. Chronic anemia. Follow-up CBC 5. Neuropsych: This patient is capable of making decisions on her own behalf. 6. Skin/Wound Care: OOB, encourage adequate nutrition 7. Fluids/Electrolytes/Nutrition: eating fairly well. BMET pending for the AM 8. Mood/depression. Resume Remeron 15 mg daily at bedtime as prior to admission. Provide emotional support 9. History of colon cancer  2010 10.Hypertension.Nimodipine 60 mg QID 11.  Trochanteric bursitis- May need Bursa injection  LOS (Days) 7 A FACE TO FACE EVALUATION WAS PERFORMED  [name] 10/03/2014, 8:10 AM

## 2014-10-04 ENCOUNTER — Inpatient Hospital Stay (HOSPITAL_COMMUNITY): Payer: Medicare Other | Admitting: Occupational Therapy

## 2014-10-04 ENCOUNTER — Inpatient Hospital Stay (HOSPITAL_COMMUNITY): Payer: PRIVATE HEALTH INSURANCE | Admitting: Speech Pathology

## 2014-10-04 ENCOUNTER — Encounter (HOSPITAL_COMMUNITY): Payer: Self-pay | Admitting: *Deleted

## 2014-10-04 ENCOUNTER — Inpatient Hospital Stay (HOSPITAL_COMMUNITY): Payer: PRIVATE HEALTH INSURANCE | Admitting: Physical Therapy

## 2014-10-04 DIAGNOSIS — M7062 Trochanteric bursitis, left hip: Secondary | ICD-10-CM | POA: Diagnosis present

## 2014-10-04 DIAGNOSIS — I6931 Cognitive deficits following cerebral infarction: Secondary | ICD-10-CM

## 2014-10-04 DIAGNOSIS — I69319 Unspecified symptoms and signs involving cognitive functions following cerebral infarction: Secondary | ICD-10-CM

## 2014-10-04 MED ORDER — DEXAMETHASONE 2 MG PO TABS
2.0000 mg | ORAL_TABLET | Freq: Two times a day (BID) | ORAL | Status: DC
  Administered 2014-10-04 – 2014-10-05 (×2): 2 mg via ORAL
  Filled 2014-10-04 (×4): qty 1

## 2014-10-04 NOTE — Progress Notes (Signed)
Speech Language Pathology Daily Session Note  Patient Details  Name: Diana Hawkins MRN: [id number] Date of Birth: 06/13/1932  Today's Date: 10/04/2014 SLP Individual Time: 1345-1430 SLP Individual Time Calculation (min): 45 min  Short Term Goals: Week 1: SLP Short Term Goal 1 (Week 1): Pt will consume current diet with no obervable s/s of aspiration, min A to utilize swallow strategies.  SLP Short Term Goal 2 (Week 1): Pt will utilize external aids to assist with recall of new information with min cues. SLP Short Term Goal 3 (Week 1): Pt will demonstrate functional problem solving for mildly complex tasks with supervision and min verbal cues.  SLP Short Term Goal 4 (Week 1): Patient will utilize speech strategies to increase speech intelligibility at conversation level.   Skilled Therapeutic Interventions:  Pt was seen for skilled ST targeting cognitive goals.  Upon arrival, pt was seated upright in wheelchair, awake, lethargic, with complaints of 8 out of 10 pain in the backs of her legs.  Pt was agreeable to participate in therapy with no further complaints of pain throughout the session.  SLP facilitated the session with a basic sequencing task targeting sustained attention and basic functional problem solving.  Pt required max assist to complete the abovementioned task due to delayed processing which resulted in decreased sustained attention and impacted all higher level cognitive processes.  SLP also facilitated the session with a basic categorization task targeting organization and error awareness, during which pt required supervision cues to identify exclusions, mod assist to explain rationale behind exclusion.  Pt was transferred back to bed at the end of today's session with all needs left within reach.  Continue per current plan of care.    FIM:  Comprehension Comprehension Mode: Auditory Comprehension: 5-Understands basic 90% of the time/requires cueing < 10% of the  time Expression Expression Mode: Verbal Expression: 5-Expresses basic needs/ideas: With extra time/assistive device Social Interaction Social Interaction: 5-Interacts appropriately 90% of the time - Needs monitoring or encouragement for participation or interaction. Problem Solving Problem Solving: 3-Solves basic 50 - 74% of the time/requires cueing 25 - 49% of the time Memory Memory: 3-Recognizes or recalls 50 - 74% of the time/requires cueing 25 - 49% of the time  Pain Pain Assessment Pain Assessment: 0-10 Pain Score: 8  Pain Type: Chronic pain Pain Location: Leg Pain Orientation: Left Pain Descriptors / Indicators: Aching Pain Onset: On-going Pain Intervention(s): Repositioned;Distraction  Therapy/Group: Individual Therapy  Ahmari Garton, Selinda Orion 10/04/2014, 3:59 PM

## 2014-10-04 NOTE — Progress Notes (Signed)
Occupational Therapy Session Note  Patient Details  Name: Diana Hawkins MRN: [id number] Date of Birth: [birth year]  Today's Date: 10/04/2014 OT Individual Time: 8657-8469 and 1300-1330 OT Individual Time Calculation (min): 45 min and 30 min   Short Term Goals: Week 1:  OT Short Term Goal 1 (Week 1): STG = LTGs due to ELOS  Skilled Therapeutic Interventions/Progress Updates:    1) Engaged in ADL retraining with focus on functional mobility, activity tolerance, and self-care retraining.  Pt received seated up in w/c finishing breakfast, reporting "dopey".  Encouraged pt to complete bathing tasks as she skipped them yesterday, willing to participate at sink level this session.  Pt extremely reliant on UE support with sit > stand and standing balance during self-care tasks and difficulty fulling extending BLE in standing causing pt to have decreased balance.  Donned pants this session with increased time and use of step stool to don Lt pant leg while decreasing hamstring pain.  Pt unwilling to attempt donning socks this session, requesting to return to bed.  Stand pivot transfer with min assist and pt required assist to bring LLE into bed due to pain.  2) Engaged in therapeutic activity with focus on sit > stand, standing balance, and activity tolerance.  Pt received seated in w/c having just returned from bathroom with nurse tech.  Pt propelled w/c with BLE approx 50 feet then pushed to therapy gym.  Engaged in card activity in standing with focus on upright standing balance and activity tolerance.  Pt continues to rely heavily on UE support with sit > stand due to pain in BLE.  Pt stood for 5 mins x2 with no c/o light headedness.  Pt required max cues for recall with activity sequencing and procedures as well as problem solving and error identification.  Pt returned to room at end of session and encouraged to remain seated in w/c until next session.  Therapy Documentation Precautions:   Precautions Precautions: Fall Restrictions Weight Bearing Restrictions: No Pain: Pain Assessment Pain Assessment: 0-10 Pain Score: 7  Pain Type: Chronic pain Pain Location: Leg Pain Orientation: Left Pain Descriptors / Indicators: Aching Pain Frequency: Constant Pain Onset: On-going Pain Intervention(s): Other (Comment);Heat applied (RN pre-medicated, per pt)  See FIM for current functional status  Therapy/Group: Individual Therapy  Simonne Come 10/04/2014, 10:15 AM

## 2014-10-04 NOTE — Progress Notes (Signed)
Physical Therapy Session Note  Patient Details  Name: Diana Hawkins MRN: [id number] Date of Birth: May 15, 1932  Today's Date: 10/04/2014 PT Individual Time  0930-1030 PT Individual Time Calculation (min): 60 min   Short Term Goals: Week 1:  PT Short Term Goal 1 (Week 1): STG's = LTG's secondary to anticipated LOS.  Skilled Therapeutic Interventions/Progress Updates:    Pt received semi reclined in bed; agreeable to therapy. Session focused on functional transfers, stair negotiation, initiating use of rolling walker, and activity tolerance. Per pt request to use bathroom, ambulated to/from bathroom with rolling walker and min guard, transferring to/from toilet with grab bars and min A. Pt performed w/c mobility x160' in controlled environment with supervision, cueing for technique for turning. Performed stand pivot transfers from w/c<>mat table with rolling walker and min A to control descent during stand > sit. Transitioned to blocked practice of sit<>stand transfers from progressively lower mat table with max multimodal cueing for controlled descent with inconsistent within-session carryover. Due to pt c/o "tightness" in bilat LE's, pt performed NuStep with bilat UE/LE's at resistance level 1 to increase muscle extensibility; pt tolerated 5 minutes prior to requesting to rest due to fatigue. Following seated rest break, pt performed functional ambulation x75' in controlled environment with rolling walker and min guard to min A with cueing to address short step length, shuffling gait pattern, and safe proximity to rolling walker. Per pt report of feeling "woozy", measured vitals in seated then standing with BP of 140/69 then 100/62, respectively, and symptomatic. MD made aware. Reinforced education on importance of hydration for BP control; pt will likely require reinforcement. Session ended in pt room, where pt was left semi reclined in bed with 3 rails up, bed alarm on, and all needs within  reach.  Therapy Documentation Precautions:  Precautions Precautions: Fall Restrictions Weight Bearing Restrictions: No Vital Signs: Therapy Vitals Temp: 98.4 F (36.9 C) Temp Source: Oral Pulse Rate: 87 Resp: 17 BP: (!) 108/47 mmHg Patient Position (if appropriate): Sitting Oxygen Therapy SpO2: 99 % O2 Device: Not Delivered Pain: Pain Assessment Pain Assessment: 0-10 Pain Score: 7  Pain Type: Chronic pain Pain Location: Leg Pain Orientation: Left Pain Descriptors / Indicators: Aching Pain Frequency: Constant Pain Onset: On-going Patients Stated Pain Goal: 2 Pain Intervention(s): Other (Comment);Heat applied (RN pre-medicated, per pt)  See FIM for current functional status  Therapy/Group: Individual Therapy  Hobble, Malva Cogan 10/04/2014, 5:17 PM

## 2014-10-04 NOTE — Progress Notes (Signed)
81 y.o. right handed female with history of colon cancer 2010 with resection and chronic anemia. Presented 09/21/2014 upon transfer from [facility]. She lives with her daughter and was independent with straight point cane prior to admission but rather sedentary and didn't get out of the house much. Patient with acute onset of headache and altered mental status. CT of the head showed moderate subarachnoid hemorrhage. Negative for posterior communicating artery aneurysm. There is mild hydrocephalus with intraventricular hemorrhage. CT angiogram negative for AVM without evidence of vasospasm. Neurosurgery Dr. Kathyrn Sheriff with conservative care  Subjective/Complaints: Left hip still sore also some soreness in the left hamstring. No significant right lower extremity pain.  Review of Systems - Negative except LLE hip pain Objective: Vital Signs: Blood pressure 136/68, pulse 75, temperature 98 F (36.7 C), temperature source Oral, resp. rate 18, height '5\' 5"'  (1.651 m), weight 58.5 kg (128 lb 15.5 oz), SpO2 100 %. No results found. No results found for this or any previous visit (from the past 72 hour(s)).   HEENT: normal Cardio: RRR and no murmurs Resp: CTA B/L GI: BS positive and NT, ND Extremity:  Pulses positive and No Edema Skin:   Other mild flushing Neuro: Alert/Oriented, Flat and Abnormal Motor Moto 5/5 in bilateral deltoid, biceps, triceps, grip, right hip flexor and knee extensor and ankle dorsal flexor plantar flexor Musc/Skel:  Other no pain with lower extremity range of motion,minimal tenderness to palpation left hamstring, Left lateral hip moderate tenderness No lower extremity erythema Gen. no acute distress   Assessment/Plan: 1. 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 perimesencephalic cistern and left sylvian fissure  which require 3+ hours per day of interdisciplinary  therapy in a comprehensive inpatient rehab setting. Physiatrist is providing close team supervision and 24 hour management of active medical problems listed below. Physiatrist and rehab team continue to assess barriers to discharge/monitor patient progress toward functional and medical goals. Team conference today please see physician documentation under team conference tab, met with team face-to-face to discuss problems,progress, and goals. Formulized individual treatment plan based on medical history, underlying problem and comorbidities. FIM: FIM - Bathing Bathing Steps Patient Completed: Chest, Right Arm, Left Arm, Abdomen, Right upper leg, Left upper leg, Front perineal area, Buttocks Bathing: 5: Set-up assist to: Adjust water temp  FIM - Upper Body Dressing/Undressing Upper body dressing/undressing steps patient completed: Thread/unthread right bra strap, Thread/unthread left bra strap, Hook/unhook bra, Thread/unthread right sleeve of pullover shirt/dresss, Thread/unthread left sleeve of pullover shirt/dress, Put head through opening of pull over shirt/dress, Pull shirt over trunk Upper body dressing/undressing: 5: Set-up assist to: Obtain clothing/put away FIM - Lower Body Dressing/Undressing Lower body dressing/undressing steps patient completed: Thread/unthread right pants leg, Thread/unthread left pants leg, Pull pants up/down Lower body dressing/undressing: 3: Mod-Patient completed 50-74% of tasks  FIM - Toileting Toileting steps completed by patient: Performs perineal hygiene, Adjust clothing prior to toileting, Adjust clothing after toileting Toileting Assistive Devices: Grab bar or rail for support Toileting: 5: Set-up assist to: Obtain supplies  FIM - Radio producer Devices: Grab bars Toilet Transfers: 4-To toilet/BSC: Min A (steadying Pt. > 75%), 4-From toilet/BSC: Min A (steadying Pt. > 75%)  FIM - Bed/Chair Transfer Bed/Chair Transfer Assistive  Devices: Arm rests Bed/Chair Transfer: 4: Chair or W/C > Bed: Min A (steadying Pt. > 75%), 4: Sit > Supine: Min A (steadying pt. > 75%/lift 1 leg), 5: Bed > Chair or W/C:  Supervision (verbal cues/safety issues), 4: Supine > Sit: Min A (steadying Pt. > 75%/lift 1 leg)  FIM - Locomotion: Wheelchair Distance: 150 Locomotion: Wheelchair: 4: Travels 150 ft or more: maneuvers on rugs and over door sillls with minimal assistance (Pt.>75%) FIM - Locomotion: Ambulation Locomotion: Ambulation Assistive Devices: Other (comment) (L HHA) Ambulation/Gait Assistance: 4: Min assist Locomotion: Ambulation: 1: Travels less than 50 ft with minimal assistance (Pt.>75%)  Comprehension Comprehension Mode: Auditory Comprehension: 5-Understands basic 90% of the time/requires cueing < 10% of the time  Expression Expression Mode: Verbal Expression: 5-Expresses basic needs/ideas: With extra time/assistive device  Social Interaction Social Interaction: 5-Interacts appropriately 90% of the time - Needs monitoring or encouragement for participation or interaction.  Problem Solving Problem Solving: 4-Solves basic 75 - 89% of the time/requires cueing 10 - 24% of the time  Memory Memory: 4-Recognizes or recalls 75 - 89% of the time/requires cueing 10 - 24% of the time  Medical Problem List and Plan: 1. 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  DVT Prophylaxis/Anticoagulation: SCDs. Monitor for any signs of DVT, patient does have lower extremity tenderness no significant swelling however will check lower extremity venous Dopplers 3. Pain Management: Tylenol as needed 4. Chronic anemia. Follow-up CBC 5. Neuropsych: This patient is capable of making decisions on her own behalf. 6. Skin/Wound Care: OOB, encourage adequate nutrition 7.  Fluids/Electrolytes/Nutrition: eating fairly well. BMET pending for the AM 8. Mood/depression. Resume Remeron 15 mg daily at bedtime as prior to admission. Provide emotional support 9. History of colon cancer 2010 10.Hypertension.Nimodipine 60 mg QID 11.  Trochanteric bursitis-  Bursa injection  LOS (Days) 8 A FACE TO FACE EVALUATION WAS PERFORMED  Diana Hawkins 10/04/2014, 9:56 AM

## 2014-10-04 NOTE — Discharge Summary (Signed)
NAMEABBEYGAIL, IGOE NO.:  [id number]  MEDICAL RECORD NO.:  [id number]  LOCATION:  4W09C                        FACILITY:  [facility]  PHYSICIAN:  Charlett Blake, M.D.DATE OF BIRTH:  02-04-1932  DATE OF ADMISSION:  09/26/2014 DATE OF DISCHARGE:  10/05/2014                              DISCHARGE SUMMARY   DISCHARGE DIAGNOSES: 1. Functional deficits secondary to nontraumatic subarachnoid     hemorrhage centered in the left suprasellar cistern. 2. Sequential compression devices for DVT prophylaxis. 3. Depression. 4. History of colon cancer in 2010. 5. Hypertension. 6. Trochanteric bursitis with bursa injection.  HISTORY OF PRESENT ILLNESS:  This is an 81 year old right-handed female with history of colon cancer in 2010 with resection who presented on September 21, 2014, upon transfer from [facility].  She lives with her daughter, was independent with a straight cane prior to admission, but rather sedentary.  The patient with acute onset of headache and altered mental status.  CT of the head showed moderate subarachnoid hemorrhage.  Negative for posterior communicating artery aneurysm.  There was mild hydrocephalus with intraventricular hemorrhage.  CT angiogram negative for AVM without evidence of vasospasms.  Neurosurgery, Dr. Kathyrn Sheriff advised conservative care, Decadron protocol as indicated.  Tolerating a regular diet.  Physical and occupational therapy ongoing.  The patient was admitted for comprehensive rehab program.  PAST MEDICAL HISTORY:  See discharge diagnoses.  SOCIAL HISTORY:  Lives with family.  Used a cane prior to admission. Does not drive.  FUNCTIONAL STATUS:  Upon admission to rehab service was minimal assist to ambulate with a rolling walker, minimal assist stand pivot transfers, min mod assist activities of daily living.  PHYSICAL EXAMINATION:  VITAL SIGNS:  Blood pressure 151/74, pulse 86, temperature 99, respirations  16. GENERAL:  This was an alert female, in no acute distress.  Looks much younger than stated age.  She was able to provide her name, age, and date of birth. LUNGS:  Clear to auscultation. CARDIAC:  Regular rate and rhythm. ABDOMEN:  Soft, nontender.  Good bowel sounds.  REHABILITATION HOSPITAL COURSE:  Patient was admitted to inpatient rehab services with therapies initiated on a 3-hour daily basis consisting of physical therapy, occupational therapy, speech therapy, and rehabilitation nursing.  The following issues were addressed during the patient's rehabilitation stay.  Pertaining to Mrs. Tomer' subarachnoid hemorrhage remained stable.  She would follow up Neurosurgery.  She was weaned off her Decadron.  She remained on sequential compression devices for DVT prophylaxis.  Blood pressures remained well controlled on nimodipine.  She continued on Remeron for history of depression, emotional support provided.  She was cooperating with therapies.  She did have some trochanteric bursitis, underwent a bursa injection on October 04, 2014, tolerated well.  The patient received weekly collaborative interdisciplinary team conferences to discuss estimated length of stay, family teaching, and any barriers to discharge.  She performs supine to sit, head of bed elevated, minimal assistance. Performs squat pivot transfers, bed to wheelchair with supervision. Propel her wheelchair 150 feet on controlled environments, ambulated to the bathroom with minimal assistance.  Needed some cuing for increased bilateral step length.  Perform sit to supine head of bed  flat with minimal assistance.  Activities of daily living, upper body dressing completed edge of bed.  Ambulated to the toilet handheld assistance. Due to limited family support at home, it was felt skilled nursing facility was needed with bed becoming available on October 05, 2014, discharge taking place at that time.  DISCHARGE MEDICATIONS: 1.  Hydrocodone 1-2 tablets every 4 hours as needed pain. 2. Claritin 10 mg p.o. daily. 3. Remeron 15 mg p.o. at bedtime. 4. Nimodipine 60 mg p.o. every 6 hours. 5. MiraLax 17 g daily with 8 ounces of water.  Hold for loose stools.  DIET:  Regular.  FOLLOWUP:  The patient should follow up Neurosurgery, Dr. Newman Pies in  two weeks, call for appointment and Dr. Alysia Penna at the outpatient rehab center as directed 11/10/2014 arrival 1:30 PM.     Lauraine Rinne, P.A.   ______________________________ Charlett Blake, M.D.    DA/MEDQ  D:  10/04/2014  T:  10/04/2014  Job:  [id number]  cc:   Dr. Teressa Senter, M.D.

## 2014-10-04 NOTE — Procedures (Signed)
Trochanteric bursa injection without ultrasound guidance  Indication Trochanteric bursitis. Exam has tenderness over the greater trochanter of the hip. Pain has not responded to conservative care such as exercise therapy and oral medications. Pain interferes with sleep or with mobility Informed consent was obtained after describing risks and benefits of the procedure with the patient these include bleeding bruising and infection. Patient has signed written consent form. Patient placed in a lateral decubitus position with the affected hip superior. Point of maximal pain was palpated marked and prepped with Betadine and entered with a needle to bone contact. Needle slightly withdrawn then 6mg of betamethasone with 4 cc 1% lidocaine were injected. Patient tolerated procedure well. Post procedure instructions given.  

## 2014-10-04 NOTE — Discharge Summary (Signed)
Discharge summary job [id number]

## 2014-10-05 ENCOUNTER — Inpatient Hospital Stay (HOSPITAL_COMMUNITY): Payer: PRIVATE HEALTH INSURANCE | Admitting: Speech Pathology

## 2014-10-05 ENCOUNTER — Inpatient Hospital Stay (HOSPITAL_COMMUNITY): Payer: PRIVATE HEALTH INSURANCE | Admitting: Occupational Therapy

## 2014-10-05 ENCOUNTER — Inpatient Hospital Stay (HOSPITAL_COMMUNITY): Payer: Medicare Other

## 2014-10-05 ENCOUNTER — Inpatient Hospital Stay (HOSPITAL_COMMUNITY): Payer: PRIVATE HEALTH INSURANCE | Admitting: Physical Therapy

## 2014-10-05 DIAGNOSIS — M7062 Trochanteric bursitis, left hip: Secondary | ICD-10-CM

## 2014-10-05 NOTE — Plan of Care (Signed)
Problem: RH Ambulation Goal: LTG Patient will ambulate in home environment (PT) LTG: Patient will ambulate in home environment, # of feet with assistance (PT).  Outcome: Not Met (add Reason) Pt can ambulate 50', but needs min A for obstacle negotiation.

## 2014-10-05 NOTE — Progress Notes (Signed)
Occupational Therapy Session Note  Patient Details  Name: Diana Hawkins MRN: [id number] Date of Birth: 07-21-1932  Today's Date: 10/05/2014 OT Individual Time: 7225-7505 OT Individual Time Calculation (min): 60 min    Short Term Goals: Week 1:  OT Short Term Goal 1 (Week 1): STG = LTGs due to ELOS  Skilled Therapeutic Interventions/Progress Updates:    Completed ADL retraining with focus on functional transfers, sit <> stand, and carryover of education on LB dressing.  Pt received in bed willing to complete in treatment session.  Performed all transfers with stand pivot from w/c with pt able to complete at supervision level, however when ambulating pt continues to require min assist or hand held assist due to decreased step length.  Pt reports continued pain in BLE/hamstrings however had increased participation this session and able to get BLE in and out of bed without assist this session.  LB dressing completed with pt able to thread both pants legs without use of step stool this session, due to decrease in hamstring/hip pain.  Pt still unable to tolerate position for increased time, therefore unable to don or fasten shoes at this time.  Completed 9 hole peg test with Rt: 34 seconds and Lt: 33 seconds.  Also completed Five times Sit to Stand Test (FTSS) with use of BUE, as unable to complete without UE support this session. See below for details.  Educated on OOB tolerance to increase activity tolerance and decrease lightheadedness.    Method: Use a straight back chair with a solid seat that is 16-18" high. Ask participant to sit on the chair with arms folded across their chest.   Instructions: "Stand up and sit down as quickly as possible 5 times, keeping your arms folded across your chest."   Measurement: Stop timing when the participant stands the 5th time.  TIME: _27.19 (with UE support)_____ (in seconds)  Times > 13.6 seconds is associated with increased disability and morbidity  (Guralnik, 2000) Times > 15 seconds is predictive of recurrent falls in healthy individuals aged 6 and older (Buatois, et al., 2008) Normal performance values in community dwelling individuals aged 25 and older (Bohannon, 2006): o 60-69 years: 11.4 seconds o 70-79 years: 12.6 seconds o 80-89 years: 14.8 seconds  MCID: ? 2.3 seconds for Vestibular Disorders Mariah Milling, 2006)   Therapy Documentation Precautions:  Precautions Precautions: Fall Restrictions Weight Bearing Restrictions: No Pain: Pain Assessment Pain Score: Asleep  See FIM for current functional status  Therapy/Group: Individual Therapy  Simonne Come 10/05/2014, 9:54 AM

## 2014-10-05 NOTE — Plan of Care (Signed)
Problem: RH Bed Mobility Goal: LTG Patient will perform bed mobility with assist (PT) LTG: Patient will perform bed mobility with assistance, with/without cues (PT).  Outcome: Not Met (add Reason) Pt able to complete with supervision, but requires max cueing and encouragement.

## 2014-10-05 NOTE — Progress Notes (Signed)
Speech Language Pathology Discharge Summary  Patient Details  Name: Diana Hawkins MRN: [id number] Date of Birth: 1932/07/13  Today's Date: 10/05/2014 SLP Individual Time: 1030-1100 SLP Individual Time Calculation (min): 30 min   Skilled Therapeutic Interventions:  Pt was seen for skilled ST targeting cognitive goals.  Upon arrival, pt was seated upright in wheelchair, awake, lethargic, and agreeable to participate in [place].  Pt anticipated the need to use the restroom and returned demonstration of safety precautions with min assist-supervision cues.  SLP then administered the MoCA cognitive assessment to measure progress made while inpatient.  Pt scored 16 on the MoCA and demonstrated moderate cognitive deficits in the areas of attention, executive function, and abstraction.  SLP suspects deficits to be a function of delayed processing which affects pt's working memory and all higher level cognitive processes.  Pt benefited from min written cues to facilitate improved working memory and organization.  Pt is on track for discharge to SNF.      Patient has met 7 of 8 long term goals.  Patient to discharge at [facility] level.  Reasons goals not met: pt currently requires mod assist for basic to semi-complex problem solving, which is adequate for discharge given SNF placement   Clinical Impression/Discharge Summary:  Pt made small, inconsistent gains while inpatient and is discharging having met 7 out of 8 long term goals.  Pt currently requires min assist for basic cognitive tasks due to delayed processing, decreased functional problem solving, and decreased working memory.  Pt also exhibits limited awareness and correction of errors.  Pt is discharging to SNF where it is recommended that she receive follow up ST to continue to address cognitive remediation/compensation.  Furthermore, SLP recommends that family education be continued at next level of care due to no family available for training while pt was  inpatient.    Care Partner:  Caregiver Able to Provide Assistance: Other (comment) (SNF)  Type of Caregiver Assistance:  (SNF)  Recommendation:  24 hour supervision/assistance;Home Health SLP;Skilled Nursing facility;Outpatient SLP  Rationale for SLP Follow Up: Maximize cognitive function and independence;Reduce caregiver burden   Equipment: none recommended by SLP    Reasons for discharge: Discharged from hospital   Patient/Family Agrees with Progress Made and Goals Achieved: Yes   See FIM for current functional status  PageSelinda Orion 10/05/2014, 7:31 PM

## 2014-10-05 NOTE — Progress Notes (Signed)
Occupational Therapy Discharge Summary  Patient Details  Name: Diana Hawkins MRN: [id number] Date of Birth: 15-Jul-1932  Patient has met 8 of 9 long term goals due to improved activity tolerance, improved balance and postural control.  Patient to discharge at overall Supervision level transfers at w/c level, min assist with ambulation.  Patient's care partner unavailable to provide the necessary physical and cognitive assistance at discharge.    Reasons goals not met: Pt continues to require mod assist with LB dressing secondary to BLE hip/hamstring pain.  Recommendation:  Patient will benefit from ongoing skilled OT services in skilled nursing facility setting to continue to advance functional skills in the area of BADL and Reduce care partner burden.  Equipment: No equipment provided  Reasons for discharge: treatment goals met and discharge from hospital  Patient/family agrees with progress made and goals achieved: Yes  OT Discharge Precautions/Restrictions  Precautions Precautions: Fall Restrictions Weight Bearing Restrictions: No Pain Pain Assessment Pain Score: c/o pain in BLE Lt > Rt with mobility ADL   See FIM Vision/Perception  Vision- History Baseline Vision/History: Wears glasses Wears Glasses: Reading only Patient Visual Report: No change from baseline Vision- Assessment Vision Assessment?: No apparent visual deficits  Cognition Overall Cognitive Status: Impaired/Different from baseline Arousal/Alertness: Awake/alert Orientation Level: Oriented X4 Memory: Impaired Memory Impairment: Decreased recall of new information Problem Solving: Impaired Problem Solving Impairment: Functional basic Safety/Judgment: Appears intact Sensation Sensation Light Touch: Impaired Detail Light Touch Impaired Details: Impaired RUE;Impaired LUE;Impaired RLE;Impaired LLE Additional Comments: Light touch impaired in bilat finger tips secondary to h/o peripheral neuropathy. Light  touch and proprioception impaired in bilat LE's (distal to knees). Coordination Gross Motor Movements are Fluid and Coordinated: No Fine Motor Movements are Fluid and Coordinated: No Finger Nose Finger Test: mild overshooting with LUE 9 Hole Peg Test: Rt: 34 seconds, Lt: 33 seconds Extremity/Trunk Assessment RUE Assessment RUE Assessment: Within Functional Limits (strength grossly 4+/5) LUE Assessment LUE Assessment: Within Functional Limits (strength grossly 4 to 4+/5)  See FIM for current functional status  Diana Hawkins, [facility] 10/05/2014, 10:13 AM

## 2014-10-05 NOTE — Patient Care Conference (Addendum)
Inpatient RehabilitationTeam Conference and Plan of Care Update Date: 10/04/2014   Time: 10:45 AM    Patient Name: Diana Hawkins      Medical Record Number: [id number]  Date of Birth: May 09, 1932 Sex: Female         Room/Bed: 4W09C/4W09C-01 Payor Info: Payor: MEDICARE / Plan: MEDICARE PART A AND B / Product Type: *No Product type* /    Admitting Diagnosis: SAH  Admit Date/Time:  09/26/2014  5:43 PM Admission Comments: No comment available   Primary Diagnosis:  SAH (subarachnoid hemorrhage) Principal Problem: SAH (subarachnoid hemorrhage)  Patient Active Problem List   Diagnosis Date Noted  . Cognitive deficit, post-stroke 10/04/2014  . Trochanteric bursitis of left hip 10/04/2014  . SAH (subarachnoid hemorrhage) 09/26/2014  . Subarachnoid hemorrhage 09/22/2014    Expected Discharge Date: Expected Discharge Date: 10/05/14  Team Members Present: Physician leading conference: Dr. Alysia Penna Social Worker Present: Alfonse Alpers, LCSW;Ren Grasse [place], [place] Nurse Present: Heather Roberts, RN PT Present: Billie Ruddy, Renaye Rakers, PT OT Present: Simonne Come, Zada Finders [place], OT SLP Present: Windell Moulding, SLP PPS Coordinator present : Daiva Nakayama, RN, CRRN     Current Status/Progress Goal Weekly Team Focus  Medical   Left hip pain, injected with corticosteroid  Maintain medical stability during rehabilitation stay  Discharge planning   Bowel/Bladder   continent urgency noted attempt to toilet every 2 hours lbm 3-1  mod I  continue with plan of care   Swallow/Nutrition/ Hydration   Regular solids, thin liquids   independent   diet toleration    ADL's   supervision stand pivot, min assist transfer with ambulation, mod-supervision with bathing and dressing depending on LLE pain  downgraded to supervision, may even need to downgrade bathing and LB dressing to min assist  transfers, sit <> stand, LB dressing, pt education   Mobility   Min A overall. Pt continues to be limited by  decreased activity tolerance, bilat LE pain (L>R)  Downgraded to supervision overall  Activty tolerance, continue to address LE pain; bed mobility; transfer/gait training; dynamic standing balance; D/C planning    Communication             Safety/Cognition/ Behavioral Observations  mod assist   min assist   basic cognition for memory, problem solving, attention and alertness    Pain   left hip pain IM injection by MD  less than 4 on 0-10 scale  monitor efffectiveness of inj.   Skin   buttocks red   no new skinbreakdown   educate on skin care .    Rehab Goals Patient on target to meet rehab goals: Yes *See Care Plan and progress notes for long and short-term goals.  Barriers to Discharge: Daughter works during the day cannot provide 24 7 care    Possible Resolutions to Barriers:  SNF placement short-term    Discharge Planning/Teaching Needs:  Plan changed to SNF per daughter feeling she cannot meet care needs.  Expect to have bed offer today      Team Discussion:  SW reports plan changed to SNF.  MD reports pt medically ready for transfer.  Doing better with txs overall.  No concerns  Revisions to Treatment Plan:  Change in d/c plan to SNF   Continued Need for Acute Rehabilitation Level of Care: The patient requires daily medical management by a physician with specialized training in physical medicine and rehabilitation for the following conditions: Daily direction of a multidisciplinary physical rehabilitation program to ensure safe treatment while  eliciting the highest outcome that is of practical value to the patient.: Yes Daily medical management of patient stability for increased activity during participation in an intensive rehabilitation regime.: Yes Daily analysis of laboratory values and/or radiology reports with any subsequent need for medication adjustment of medical intervention for : Neurological problems  Diana Hawkins 10/05/2014, 7:27 AM

## 2014-10-05 NOTE — Progress Notes (Signed)
Social Work [name], female   DOB: 16-Jul-1932, 81 y.o.   MRN: [id number]   Reviewed with pt and daughter yesterday that we had received two SNF bed offers.  Daughter accepting the bed offer from [facility] with plan to transfer pt there today via ambulance.  Pt was agreeable with the change in d/c plan to SNF and agreeable to this facility.  Tx team aware.  Rudra Hobbins, LCSW

## 2014-10-05 NOTE — Progress Notes (Signed)
Physical Therapy Discharge Summary  Patient Details  Name: Karem Farha MRN: [id number] Date of Birth: 1932/07/15  Today's Date: 10/05/2014 PT Individual Time: 1130-1200 PT Individual Time Calculation (min): 30 min    Patient has met 3 of 6 long term goals due to improved activity tolerance, improved balance and improved postural control.  Patient to discharge at an ambulatory level [place].   Patient's care partner unavailable to provide the necessary physical and cognitive assistance at discharge; therefore, pt discharging to SNF for continued skilled services.   Reasons goals not met: Bed mobility not met secondary to max cues and encouragement required. Goals for ambulation and home and controlled environment not met due to min A needed for obstacle negotiation and decreased distance ambulated.    Recommendation:  Patient will benefit from ongoing skilled PT services in skilled nursing facility setting to continue to advance safe functional mobility, address ongoing impairments in gait stability and activity tolerance, and minimize fall risk.  Equipment: No equipment provided. Equipment to be provided at next venue of care.   Reasons for discharge: treatment goals met and discharge from hospital.   Patient/family agrees with progress made and goals achieved: Yes   Skilled Therapeutic Interventions/Progress Updates Discharge evaluation performed; see below for detailed findings. Pt educated on goals, progress, and discharge plan. Pt verbalized understanding and was in full agreement.  PT Discharge Precautions/Restrictions Precautions Precautions: None Restrictions Weight Bearing Restrictions: No Pain  Pt reports 7/10 acute pain in L distal hamstring which increased with activity; heat applied. Cognition Overall Cognitive Status: Impaired/Different from baseline Arousal/Alertness: Awake/alert Orientation Level: Oriented X4 Memory: Impaired Memory Impairment: Decreased recall  of new information Problem Solving: Impaired Problem Solving Impairment: Functional basic Safety/Judgment: Appears intact Sensation Sensation Light Touch: Impaired Detail Light Touch Impaired Details: Impaired RUE;Impaired LUE;Impaired RLE;Impaired LLE Stereognosis: Not tested Hot/Cold: Not tested Proprioception: Impaired Detail Proprioception Impaired Details: Impaired RLE;Impaired LLE Additional Comments: Light touch impaired in bilat finger tips secondary to h/o peripheral neuropathy. Light touch and proprioception impaired in bilat LE's (distal to knees). Coordination Gross Motor Movements are Fluid and Coordinated: No Fine Motor Movements are Fluid and Coordinated: No Motor  Motor Motor: Within Functional Limits;Other (comment) Motor - Discharge Observations: Posterior preference no longer present during standing  Mobility Bed Mobility Bed Mobility: Rolling Right;Supine to Sit;Sit to Supine Rolling Right: 6: Modified independent (Device/Increase time) Supine to Sit: 5: Supervision;HOB flat Supine to Sit Details: Verbal cues for sequencing;Verbal cues for technique Sitting - Scoot to Edge of Bed: 5: Supervision Sitting - Scoot to [employer] of Bed Details: Verbal cues for sequencing;Verbal cues for technique Sit to Supine: 5: Supervision;HOB flat Sit to Supine - Details: Verbal cues for sequencing;Verbal cues for technique Scooting to HOB: 5: Supervision Scooting to [facility] Details: Verbal cues for technique;Verbal cues for sequencing Transfers Transfers: No Sit to Stand: 5: Supervision;From bed Sit to Stand Details: Verbal cues for sequencing;Verbal cues for technique;Verbal cues for precautions/safety Stand to Sit: 5: Supervision Stand to Sit Details (indicate cue type and reason): Verbal cues for sequencing;Verbal cues for technique;Verbal cues for precautions/safety Stand Pivot Transfers: 5: Supervision;Other (comment) (using rolling walker) Stand Pivot Transfer Details: Verbal  cues for gait pattern;Verbal cues for technique;Verbal cues for safe use of DME/AE;Verbal cues for precautions/safety Locomotion  Ambulation Ambulation: Yes Ambulation/Gait Assistance: 4: Min guard;4: Min assist;5: Supervision Ambulation Distance (Feet): 80 Feet Assistive device: Rolling walker Ambulation/Gait Assistance Details: Verbal cues for sequencing;Verbal cues for technique;Verbal cues for gait pattern Ambulation/Gait Assistance Details: Pt  ambulated x80' in controlled environment with rolling walker requiring close supervision to min guard for linear gait, min A for turning and obstacle negotiation. Gait Gait: Yes Gait Pattern: Impaired Gait Pattern: Step-through pattern;Decreased trunk rotation;Left flexed knee in stance;Right flexed knee in stance;Decreased stride length;Shuffle;Decreased hip/knee flexion - right;Decreased hip/knee flexion - left Gait velocity: Self-selected gait speed = .17 m/s Stairs / Additional Locomotion Stairs: Yes Stairs Assistance: 4: Min assist Stairs Assistance Details: Verbal cues for sequencing;Verbal cues for technique;Verbal cues for precautions/safety;Tactile cues for weight beaing Stairs Assistance Details (indicate cue type and reason): Pt negotiated 6 stairs diagonally, initially with bilat UE support at L rail to ascend but transitioned to use of R rail (technique as described) due to onset of visual disturbance.disequilibrium. Stair Management Technique: One rail Left;One rail Right;Step to pattern;Sideways Number of Stairs: 6 Height of Stairs: 7.5 Wheelchair Mobility Wheelchair Mobility: Yes Wheelchair Assistance: 5: Supervision Wheelchair Assistance Details: Verbal cues for sequencing;Verbal cues for Marketing executive: Both upper extremities Wheelchair Parts Management: Needs assistance;Other (comment) (able to lock w/c brakes with cueing) Distance: 150  Trunk/Postural Assessment  Cervical Assessment Cervical Assessment:  Within Functional Limits Thoracic Assessment Thoracic Assessment: Exceptions to [facility] (thoracic kyphosis) Lumbar Assessment Lumbar Assessment: Exceptions to [facility] (posterior pelvic tilt) Postural Control Postural Control: Deficits on evaluation Trunk Control: Pt continues to exhibit lateral trunk  lean to L side while seated EOB.  Balance Balance Balance Assessed: Yes Standardized Balance Assessment Standardized Balance Assessment:  (Not assessed due to pain, decreased pt tolerance to sitting, standing) Dynamic Sitting Balance Dynamic Sitting - Balance Support: No upper extremity supported;Feet unsupported Dynamic Sitting - Level of Assistance: 6: Modified independent (Device/Increase time) Dynamic Standing Balance Dynamic Standing - Balance Support: During functional activity;Right upper extremity supported Dynamic Standing - Level of Assistance: 5: Stand by assistance Dynamic Standing - Comments: Pt requires supervision for hygeine in standing during toileting; intermittent RUE support ar grab bar. Extremity Assessment  RUE Assessment RUE Assessment: Within Functional Limits LUE Assessment LUE Assessment: Within Functional Limits RLE Assessment RLE Assessment: Within Functional Limits RLE AROM (degrees) Overall AROM Right Lower Extremity: Within functional limits for tasks assessed RLE PROM (degrees) Overall PROM Right Lower Extremity: Within functional limits for tasks assessed RLE Strength RLE Overall Strength: Deficits RLE Overall Strength Comments: Grossly 4/5. LLE Assessment LLE Assessment: Exceptions to WFL LLE AROM (degrees) Overall AROM Left Lower Extremity: Within functional limits for tasks assessed LLE Strength LLE Overall Strength: Deficits LLE Overall Strength Comments: L hip/knee grossly 4-/5; 4/5 in L ankle.  See FIM for current functional status  Beau [place], Malva Cogan 10/05/2014, 4:28 PM

## 2014-10-05 NOTE — Plan of Care (Signed)
Problem: RH Ambulation Goal: LTG Patient will ambulate in controlled environment (PT) LTG: Patient will ambulate in a controlled environment, # of feet with assistance (PT).  Outcome: Not Met (add Reason) Pt ambulated with close supervision for linear gait, but unable to ambulate more than 80'

## 2014-10-05 NOTE — Progress Notes (Signed)
Subjective/Complaints: Left hip better .  Slept well  Review of Systems - Negative except LLE hamstring soreness Objective: Vital Signs: Blood pressure 117/63, pulse 81, temperature 98 F (36.7 C), temperature source Oral, resp. rate 18, height 5\' 5"  (1.651 m), weight 57.788 kg (127 lb 6.4 oz), SpO2 97 %. No results found. No results found for this or any previous visit (from the past 72 hour(s)).   HEENT: normal Cardio: RRR and no murmurs Resp: CTA B/L GI: BS positive and NT, ND Extremity:  Pulses positive and No Edema Skin:   Other mild flushing Neuro: Alert/Oriented, Flat and Abnormal Motor Moto 5/5 in bilateral deltoid, biceps, triceps, grip, right hip flexor and knee extensor and ankle dorsal flexor plantar flexor Musc/Skel:  Other no pain with lower extremity range of motion,minimal tenderness to palpation left hamstring, Left lateral hip moderate tenderness No lower extremity erythema Gen. no acute distress   Assessment/Plan: 1. 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 perimesencephalic cistern and left sylvian fissure Stable for D/C today to SNF F/u PCP in 1-2 weeks F/u PM&R 3 weeks See D/C summary See D/C instructions FIM: FIM - Bathing Bathing Steps Patient Completed: Chest, Right Arm, Left Arm, Abdomen, Right upper leg, Left upper leg, Front perineal area, Buttocks Bathing: 5: Set-up assist to: Obtain items  FIM - Upper Body Dressing/Undressing Upper body dressing/undressing steps patient completed: Thread/unthread right bra strap, Thread/unthread left bra strap, Hook/unhook bra, Thread/unthread right sleeve of pullover shirt/dresss, Thread/unthread left sleeve of pullover shirt/dress, Put head through opening of pull over shirt/dress, Pull shirt over trunk Upper body dressing/undressing: 5: Set-up assist to: Obtain clothing/put away FIM - Lower Body  Dressing/Undressing Lower body dressing/undressing steps patient completed: Thread/unthread right pants leg, Thread/unthread left pants leg, Pull pants up/down Lower body dressing/undressing: 3: Mod-Patient completed 50-74% of tasks  FIM - Toileting Toileting steps completed by patient: Performs perineal hygiene, Adjust clothing prior to toileting, Adjust clothing after toileting Toileting Assistive Devices: Grab bar or rail for support Toileting: 5: Set-up assist to: Obtain supplies  FIM - Radio producer Devices: Grab bars Toilet Transfers: 4-To toilet/BSC: Min A (steadying Pt. > 75%), 4-From toilet/BSC: Min A (steadying Pt. > 75%)  FIM - Control and instrumentation engineer Devices: Arm rests, Copy: 5: Supine > Sit: Supervision (verbal cues/safety issues), 4: Sit > Supine: Min A (steadying pt. > 75%/lift 1 leg), 4: Bed > Chair or W/C: Min A (steadying Pt. > 75%), 4: Chair or W/C > Bed: Min A (steadying Pt. > 75%)  FIM - Locomotion: Wheelchair Distance: 160 Locomotion: Wheelchair: 5: Travels 150 ft or more: maneuvers on rugs and over door sills with supervision, cueing or coaxing FIM - Locomotion: Ambulation Locomotion: Ambulation Assistive Devices: Administrator Ambulation/Gait Assistance: 4: Min guard, 4: Min assist Locomotion: Ambulation: 2: Travels 50 - 149 ft with minimal assistance (Pt.>75%)  Comprehension Comprehension Mode: Auditory Comprehension: 5-Understands basic 90% of the time/requires cueing < 10% of the time  Expression Expression Mode: Verbal Expression: 5-Expresses basic needs/ideas: With extra time/assistive device  Social Interaction Social Interaction: 5-Interacts appropriately 90% of the time - Needs monitoring or encouragement for participation or interaction.  Problem Solving Problem Solving: 3-Solves basic 50 - 74% of the time/requires cueing 25 - 49% of the time  Memory Memory:  3-Recognizes or recalls 50 - 74% of the time/requires cueing 25 - 49% of the time  Medical Problem List  and Plan: 1. 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  Trochanteric bursitis-  Bursa injection helpful, may need to repeat in office if recurrent  LOS (Days) [place] E 10/05/2014, 8:04 AM

## 2014-10-06 NOTE — Progress Notes (Signed)
Social Work  Discharge Note  The overall goal for the admission was met for:   Discharge location: No - plan changed to SNF  Length of Stay: Yes - 9 days  Discharge activity level: No  Home/community participation: No  Services provided included: MD, RD, PT, OT, SLP, RN, TR, Pharmacy, Neuropsych and SW  Financial Services: Medicare and Private Insurance: [place]  Follow-up services arranged: Other: SNF @ [facility]   Comments (or additional information):  Patient/Family verbalized understanding of follow-up arrangements: Yes  Individual responsible for coordination of the follow-up plan: daughter  Confirmed correct DME delivered:  NA    [name]

## 2014-11-10 ENCOUNTER — Inpatient Hospital Stay: Payer: Medicare Other | Admitting: Physical Medicine & Rehabilitation

## 2014-11-10 ENCOUNTER — Encounter: Payer: Medicare Other | Attending: Physical Medicine & Rehabilitation

## 2015-01-13 NOTE — Op Note (Signed)
DIAGNOSTIC CEREBRAL ANGIOGRAM    OPERATOR:   Dr. [name], MD  HISTORY:   The patient is a 81 y.o. yo female Presenting to the hospital with headache, and CT scan demonstrating subarachnoid hemorrhage.  The patient is therefore presenting for diagnostic cerebral angiogram for identification of any intracranial aneurysms.  APPROACH:   The technical aspects of the procedure as well as its potential risks and benefits were reviewed with the patient. These risks included but were not limited bleeding, infection, allergic reaction, damage to organs/vital structures, stroke, non-diagnostic procedure, and the catastrophic outcomes of heart attack, coma, and death. With an understanding of these risks, informed consent was obtained and witnessed.    The patient was placed in the supine position on the angiography table and the skin of right groin prepped in the usual sterile fashion. The procedure was performed under local anesthesia (1%-solution of bicarbonate-bufferred Lidoacaine) and conscious sedation with Versed and fentanyl monitored by the in-suite nurse.    A 5- French sheath was introduced in the right common femoral artery using Seldinger technique.  A fluorophase sequence was used to document the sheath position.    HEPARIN: 0 Units total.   CONTRAST AGENT: 110cc, Omnipaque 300   FLUOROSCOPY TIME: 8.0 combined AP and lateral minutes    CATHETER(S) AND WIRE(S):    5-French JB-1 glidecatheter   0.035" glidewire    VESSELS CATHETERIZED:   Right common carotid   Left common carotid   Right vertebral   Right common femoral  VESSELS STUDIED:   Aortic arch Right common carotid, neck Right common carotid, head Right vertebral, neck Right vertebral, head Left common carotid, neck Left common carotid, head Right femoral  PROCEDURAL NARRATIVE:   A 5-Fr JB-1 terumo glide catheter was advanced over a 0.035 glidewire into the aortic arch. The above vessels were then sequentially  catheterized and cervical/cerebral angiograms taken. After review of images, the catheter was removed without incident.    INTERPRETATION:   Aortic arch:    Type III, normal three vessel arch configuration. No significant ostial stenosis.   Right common carotid: neck:   There is no significant stenosis, occlusion, aneurysm or plaque visualized on this injection.    Right common carotid: head:   Injection reveals the presence of a widely patent ICA, M1, and A1 segments and their branches. There is no significant stenosis, occlusion, aneurysm or high flow vascular malformation visualized.  The parenchymal and venous phases are normal. The venous sinuses are widely patent.    Left common carotid: neck:   There is no significant stenosis, occlusion, aneurysm or plaque visualized on this injection.    Left common carotid: head:   Injection reveals the presence of a widely patent ICA, A1, and M1 segments and their branches. There is no significant stenosis, occlusion, aneurysm, or high flow vascular malformation visualized. The parenchymal and venous phases are normal. The venous sinuses are widely patent.    Right vertebral, neck: The cervical segments of the right vertebral artery are unremarkable, without evidence of dissection, pseudoaneurysm, or stenosis.  Right vertebral, head:   Injection reveals the presence of a widely patent vertebral artery. This leads to a widely patent basilar artery that terminates in bilateral P1. The basilar apex is normal. There is no significant stenosis, occlusion, aneurysm, or vascular malformation visualized. There is contrast reflux into the distal contralateral vertebral artery, with visualization of the left PICA.  No aneurysms are seen.The parenchymal and venous phases are normal. The venous sinuses are  widely patent.    Right femoral:    Normal vessel. No significant atherosclerotic disease. Arterial sheath in adequate position.   DISPOSITION:  Upon  completion of the study, the femoral sheath was removed and hemostasis obtained using a 5-Fr ExoSeal closure device. Good proximal and distal lower extremity pulses were documented upon achievement of hemostasis.    The procedure was well tolerated and no early complications were observed.       The patient was transferred back to the holding area to be positioned flat in bed for 3 hours of observation.    IMPRESSION:  1. No intracranial aneurysms, arteriovenous malformations, or high flow fistulas are seen.  The preliminary results of this procedure were shared with the patient and the patient's family.

## 2015-04-29 ENCOUNTER — Encounter (HOSPITAL_COMMUNITY): Payer: Self-pay | Admitting: Emergency Medicine

## 2015-04-29 ENCOUNTER — Emergency Department (HOSPITAL_COMMUNITY)
Admission: EM | Admit: 2015-04-29 | Discharge: 2015-04-29 | Disposition: A | Discharge status: Home / Self Care | Payer: Medicare Other | Attending: Emergency Medicine | Admitting: Emergency Medicine

## 2015-04-29 ENCOUNTER — Emergency Department (HOSPITAL_COMMUNITY): Payer: Medicare Other

## 2015-04-29 DIAGNOSIS — Z862 Personal history of diseases of the blood and blood-forming organs and certain disorders involving the immune mechanism: Secondary | ICD-10-CM | POA: Insufficient documentation

## 2015-04-29 DIAGNOSIS — Z8673 Personal history of transient ischemic attack (TIA), and cerebral infarction without residual deficits: Secondary | ICD-10-CM | POA: Diagnosis not present

## 2015-04-29 DIAGNOSIS — G629 Polyneuropathy, unspecified: Secondary | ICD-10-CM

## 2015-04-29 DIAGNOSIS — F329 Major depressive disorder, single episode, unspecified: Secondary | ICD-10-CM | POA: Insufficient documentation

## 2015-04-29 DIAGNOSIS — I10 Essential (primary) hypertension: Secondary | ICD-10-CM | POA: Diagnosis not present

## 2015-04-29 DIAGNOSIS — M6281 Muscle weakness (generalized): Secondary | ICD-10-CM | POA: Insufficient documentation

## 2015-04-29 DIAGNOSIS — Z8619 Personal history of other infectious and parasitic diseases: Secondary | ICD-10-CM | POA: Insufficient documentation

## 2015-04-29 DIAGNOSIS — M549 Dorsalgia, unspecified: Secondary | ICD-10-CM | POA: Insufficient documentation

## 2015-04-29 DIAGNOSIS — R5383 Other fatigue: Secondary | ICD-10-CM | POA: Diagnosis present

## 2015-04-29 DIAGNOSIS — Z79899 Other long term (current) drug therapy: Secondary | ICD-10-CM | POA: Diagnosis not present

## 2015-04-29 DIAGNOSIS — R29898 Other symptoms and signs involving the musculoskeletal system: Secondary | ICD-10-CM

## 2015-04-29 HISTORY — DX: Depression, unspecified: F32.A

## 2015-04-29 HISTORY — DX: Essential (primary) hypertension: I10

## 2015-04-29 HISTORY — DX: Cerebral infarction, unspecified: I63.9

## 2015-04-29 HISTORY — DX: Major depressive disorder, single episode, unspecified: F32.9

## 2015-04-29 LAB — CBC WITH DIFFERENTIAL/PLATELET
BASOS PCT: 0 %
Basophils Absolute: 0 10*3/uL (ref 0.0–0.1)
EOS ABS: 0.1 10*3/uL (ref 0.0–0.7)
EOS PCT: 1 %
HCT: 42.3 % (ref 36.0–46.0)
Hemoglobin: 13.8 g/dL (ref 12.0–15.0)
LYMPHS ABS: 1.6 10*3/uL (ref 0.7–4.0)
Lymphocytes Relative: 20 %
MCH: 28.3 pg (ref 26.0–34.0)
MCHC: 32.6 g/dL (ref 30.0–36.0)
MCV: 86.9 fL (ref 78.0–100.0)
MONO ABS: 0.4 10*3/uL (ref 0.1–1.0)
MONOS PCT: 5 %
Neutro Abs: 5.6 10*3/uL (ref 1.7–7.7)
Neutrophils Relative %: 74 %
PLATELETS: 273 10*3/uL (ref 150–400)
RBC: 4.87 MIL/uL (ref 3.87–5.11)
RDW: 15.1 % (ref 11.5–15.5)
WBC: 7.7 10*3/uL (ref 4.0–10.5)

## 2015-04-29 LAB — COMPREHENSIVE METABOLIC PANEL
ALBUMIN: 4.1 g/dL (ref 3.5–5.0)
ALT: 15 U/L (ref 14–54)
ANION GAP: 8 (ref 5–15)
AST: 22 U/L (ref 15–41)
Alkaline Phosphatase: 61 U/L (ref 38–126)
BUN: 15 mg/dL (ref 6–20)
CALCIUM: 9.8 mg/dL (ref 8.9–10.3)
CO2: 29 mmol/L (ref 22–32)
Chloride: 104 mmol/L (ref 101–111)
Creatinine, Ser: 0.89 mg/dL (ref 0.44–1.00)
GFR calc non Af Amer: 59 mL/min — ABNORMAL LOW (ref >60)
GLUCOSE: 98 mg/dL (ref 65–99)
POTASSIUM: 3.8 mmol/L (ref 3.5–5.1)
SODIUM: 141 mmol/L (ref 135–145)
Total Bilirubin: 0.7 mg/dL (ref 0.3–1.2)
Total Protein: 7.1 g/dL (ref 6.5–8.1)

## 2015-04-29 LAB — URINALYSIS, ROUTINE W REFLEX MICROSCOPIC
BILIRUBIN URINE: NEGATIVE
Glucose, UA: NEGATIVE mg/dL
Hgb urine dipstick: NEGATIVE
KETONES UR: NEGATIVE mg/dL
LEUKOCYTES UA: NEGATIVE
Nitrite: NEGATIVE
PH: 7 (ref 5.0–8.0)
Protein, ur: NEGATIVE mg/dL
SPECIFIC GRAVITY, URINE: 1.003 — AB (ref 1.005–1.030)
UROBILINOGEN UA: 0.2 mg/dL (ref 0.0–1.0)

## 2015-04-29 MED ORDER — GADOBENATE DIMEGLUMINE 529 MG/ML IV SOLN
12.0000 mL | Freq: Once | INTRAVENOUS | Status: AC | PRN
  Administered 2015-04-29: 12 mL via INTRAVENOUS

## 2015-04-29 MED ORDER — GABAPENTIN 100 MG PO CAPS: 100.0000 mg | ORAL_CAPSULE | Freq: Three times a day (TID) | ORAL | Status: AC

## 2015-04-29 NOTE — ED Notes (Signed)
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

## 2015-04-29 NOTE — ED Notes (Signed)
Daughter states patient needs transportation back to [place]. [facility] contacted.

## 2015-04-29 NOTE — ED Provider Notes (Signed)
CSN: [id number]     Arrival date & time 04/29/15  1201 History   First MD Initiated Contact with Patient 04/29/15 1209     Chief Complaint  Patient presents with  . Fatigue     (Consider location/radiation/quality/duration/timing/severity/associated sxs/prior Treatment) Patient is a 81 y.o. female presenting with extremity weakness.  Extremity Weakness This is a recurrent problem. The current episode started more than 1 week ago. The problem occurs constantly. The problem has been gradually worsening. Pertinent negatives include no chest pain, no abdominal pain, no headaches and no shortness of breath. Nothing aggravates the symptoms. Nothing relieves the symptoms. She has tried nothing for the symptoms. The treatment provided mild relief.    Past Medical History  Diagnosis Date  . Hepatitis A 01/1976  . Irregular heartbeat 02/2001    Irregular heartbeat checked  . Anemia 07/2008    extreme anemia  . Depressive disorder   . Hypertension   . Stroke    Past Surgical History  Procedure Laterality Date  . Colon surgery  1/10    Stage 3 Cancer  . Breast surgery Right 11/1966    Benign tumor right breast removed  . Abdominal hysterectomy  01/1968    Complete   . Oophorectomy Right 08/1964    Right Ovary Removed, benign tumor  . Cataract extraction w/ intraocular lens implant Bilateral 04/03 L eye; 06/03 R eye  . Pancreatic cyst drainage  03/2012    Benign   History reviewed. No pertinent family history. Social History  Substance Use Topics  . Smoking status: Never Smoker   . Smokeless tobacco: None  . Alcohol Use: No   OB History    No data available     Review of Systems  Constitutional: Positive for fatigue. Negative for fever and chills.  HENT: Negative for congestion.   Eyes: Negative for photophobia and pain.  Respiratory: Negative for cough and shortness of breath.   Cardiovascular: Negative for chest pain.  Gastrointestinal: Negative for nausea, vomiting and  abdominal pain.  Musculoskeletal: Positive for back pain and extremity weakness.  Neurological: Negative for seizures, numbness and headaches.       Paresthesias to bilateral feet  All other systems reviewed and are negative.     Allergies  Codeine and Reglan  Home Medications   Prior to Admission medications   Medication Sig Start Date End Date Taking? Authorizing Provider  acetaminophen (TYLENOL) 325 MG tablet Take 650 mg by mouth every 6 (six) hours as needed.   Yes Historical Provider, MD  Biotin 300 MCG TABS Take 300 mcg by mouth daily.   Yes Historical Provider, MD  Cholecalciferol (VITAMIN D PO) Take 1 tablet by mouth daily.   Yes Historical Provider, MD  loratadine (CLARITIN) 10 MG tablet Take 10 mg by mouth daily.   Yes Historical Provider, MD  mirtazapine (REMERON) 15 MG tablet Take 15 mg by mouth at bedtime.   Yes Historical Provider, MD  Multiple Vitamins-Minerals (MULTIVITAMIN WITH MINERALS) tablet Take 1 tablet by mouth 2 (two) times daily.   Yes Historical Provider, MD  polyethylene glycol (MIRALAX / GLYCOLAX) packet Take 17 g by mouth daily as needed for moderate constipation.    Yes Historical Provider, MD  Probiotic Product (PROBIOTIC DAILY PO) Take 1 capsule by mouth daily.   Yes Historical Provider, MD  Psyllium (REGULOID) 28.3 % POWD Take 2.5 mLs by mouth daily as needed (for fiber).   Yes Historical Provider, MD  gabapentin (NEURONTIN) 100 MG capsule Take 1  capsule (100 mg total) by mouth 3 (three) times daily. 04/29/15   Corene Cornea Mesner, MD   BP 149/62 mmHg  Pulse 80  Temp(Src) 98.1 F (36.7 C) (Rectal)  Resp 16  SpO2 98% Physical Exam  Constitutional: She is oriented to person, place, and time. She appears well-developed and well-nourished.  Neck: Normal range of motion.  Cardiovascular: Normal rate and regular rhythm.   Pulmonary/Chest: Effort normal. No respiratory distress.  Abdominal: Soft. She exhibits no distension. There is no tenderness.   Musculoskeletal: Normal range of motion. She exhibits no edema or tenderness.  Neurological: She is alert and oriented to person, place, and time. No cranial nerve deficit. Coordination normal.  No altered mental status, able to give full seemingly accurate history.  Face is symmetric, EOM's intact, pupils equal and reactive, vision intact, tongue and uvula midline without deviation Upper and Lower extremity motor 5/5, intact pain perception in distal extremities, 2+ reflexes in biceps, patella and achilles tendons. Finger to nose normal, heel to shin normal.   Nursing note and vitals reviewed.   ED Course  Procedures (including critical care time) Labs Review Labs Reviewed  COMPREHENSIVE METABOLIC PANEL - Abnormal; Notable for the following:    GFR calc non Af Amer 59 (*)    All other components within normal limits  URINALYSIS, ROUTINE W REFLEX MICROSCOPIC (NOT AT [facility]) - Abnormal; Notable for the following:    Specific Gravity, Urine 1.003 (*)    All other components within normal limits  CBC WITH DIFFERENTIAL/PLATELET    Imaging Review Ct Head Wo Contrast  04/29/2015   CLINICAL DATA:  Patient with bilateral leg heaviness and tingling for 1 month. Prior CVA.  EXAM: CT HEAD WITHOUT CONTRAST  TECHNIQUE: Contiguous axial images were obtained from the base of the skull through the vertex without intravenous contrast.  COMPARISON:  Brain CT 09/21/2014  FINDINGS: Extensive periventricular and subcortical white matter hypodensity compatible with chronic small vessel ischemic changes. No evidence for acute cortically based infarct, intracranial hemorrhage, mass lesion or mass-effect. Visualized orbits are unremarkable. Paranasal sinuses are well aerated. Mastoid air cells are unremarkable. Calvarium is intact.  IMPRESSION: Chronic small vessel ischemic changes. No acute intracranial process.   Electronically Signed   By: Lovey Newcomer M.D.   On: 04/29/2015 15:55   Mr Thoracic Spine W Wo  Contrast  04/29/2015   CLINICAL DATA:  81 year old female with bilateral lower extremity weakness. Dizziness. Subarachnoid hemorrhage in February this year.  EXAM: MRI THORACIC AND LUMBAR SPINE WITHOUT AND WITH CONTRAST  TECHNIQUE: Multiplanar and multiecho pulse sequences of the thoracic and lumbar spine were obtained without and with intravenous contrast.  CONTRAST:  35mL MULTIHANCE GADOBENATE DIMEGLUMINE 529 MG/ML IV SOLN  COMPARISON:  Portable chest radiograph 10/05/2014.  FINDINGS: MR THORACIC SPINE FINDINGS  Limited sagittal imaging of the cervical spine appears normal for age.  Thoracic vertebral height and alignment is normal. There is mild anterior endplate marrow edema at the T6-T7 level which appears to be degenerative in nature. Otherwise normal for age bone marrow signal. No other acute osseous abnormality.  CSF pulsation artifact on axial T1 and T2 weighted images. Sagittal images and axial gradient echo images demonstrate normal thoracic spinal canal patency without spinal stenosis despite several small thoracic disc herniations in the midline (T3-T4 series 1900, image 17, T4-T5 image 20).  No signal abnormality in the thoracic spinal cord. The conus medullaris appears normal at T12-L1. Following contrast, no abnormal intradural enhancement enhancement along the ventral and dorsal  distal cord (series 2400, image 9) is felt related to physiologic cord vasculature.  Subcentimeter thyroid nodules left greater than right, do not meet size criteria for ultrasound follow-up. Otherwise negative visualized thoracic viscera. Negative visualized upper abdominal viscera. Negative visualized posterior paraspinal soft tissues.  MR LUMBAR SPINE FINDINGS  Normal lumbar segmentation. Trace anterolisthesis at L4-L5. Otherwise normal lumbar vertebral height and alignment. Bone marrow signal is normal for age. No marrow edema or evidence of acute osseous abnormality. Visible sacrum is intact.  Moderate to severe  distension of what appears to be the rectum or sigmoid colon in the pelvis (series 3, image 13 and series 29, image 81. Otherwise negative visualized pelvic viscera. Visualized abdominal viscera appear normal for age. Negative visualized posterior paraspinal soft tissues.  Visualized lower thoracic spinal cord is normal with conus medularis at T12-L1. Cauda equina nerve roots appear normal. No abnormal intradural enhancement.  Normal lumbar spinal canal patency without spinal stenosis. Mild for age disc and posterior element degeneration at L3-L4, L4-L5, and L5-S1.  IMPRESSION: 1. Largely unremarkable for age MRI appearance of the thoracic and lumbar spine. No spinal stenosis and mild/age congruent degenerative changes. Negative thoracic spinal cord and cauda equina. 2. Degenerative anterior vertebral endplate edema at O1-L5. Otherwise no osseous abnormality. 3. Moderately to severely distended viscus in the lower abdomen and pelvis appears to be the distal colon.   Electronically Signed   By: Genevie Ann M.D.   On: 04/29/2015 15:29   Mr Lumbar Spine W Wo Contrast  04/29/2015   CLINICAL DATA:  81 year old female with bilateral lower extremity weakness. Dizziness. Subarachnoid hemorrhage in February this year.  EXAM: MRI THORACIC AND LUMBAR SPINE WITHOUT AND WITH CONTRAST  TECHNIQUE: Multiplanar and multiecho pulse sequences of the thoracic and lumbar spine were obtained without and with intravenous contrast.  CONTRAST:  3mL MULTIHANCE GADOBENATE DIMEGLUMINE 529 MG/ML IV SOLN  COMPARISON:  Portable chest radiograph 10/05/2014.  FINDINGS: MR THORACIC SPINE FINDINGS  Limited sagittal imaging of the cervical spine appears normal for age.  Thoracic vertebral height and alignment is normal. There is mild anterior endplate marrow edema at the T6-T7 level which appears to be degenerative in nature. Otherwise normal for age bone marrow signal. No other acute osseous abnormality.  CSF pulsation artifact on axial T1 and T2  weighted images. Sagittal images and axial gradient echo images demonstrate normal thoracic spinal canal patency without spinal stenosis despite several small thoracic disc herniations in the midline (T3-T4 series 1900, image 17, T4-T5 image 20).  No signal abnormality in the thoracic spinal cord. The conus medullaris appears normal at T12-L1. Following contrast, no abnormal intradural enhancement enhancement along the ventral and dorsal distal cord (series 2400, image 9) is felt related to physiologic cord vasculature.  Subcentimeter thyroid nodules left greater than right, do not meet size criteria for ultrasound follow-up. Otherwise negative visualized thoracic viscera. Negative visualized upper abdominal viscera. Negative visualized posterior paraspinal soft tissues.  MR LUMBAR SPINE FINDINGS  Normal lumbar segmentation. Trace anterolisthesis at L4-L5. Otherwise normal lumbar vertebral height and alignment. Bone marrow signal is normal for age. No marrow edema or evidence of acute osseous abnormality. Visible sacrum is intact.  Moderate to severe distension of what appears to be the rectum or sigmoid colon in the pelvis (series 3, image 13 and series 29, image 81. Otherwise negative visualized pelvic viscera. Visualized abdominal viscera appear normal for age. Negative visualized posterior paraspinal soft tissues.  Visualized lower thoracic spinal cord is normal with conus medularis  at T12-L1. Cauda equina nerve roots appear normal. No abnormal intradural enhancement.  Normal lumbar spinal canal patency without spinal stenosis. Mild for age disc and posterior element degeneration at L3-L4, L4-L5, and L5-S1.  IMPRESSION: 1. Largely unremarkable for age MRI appearance of the thoracic and lumbar spine. No spinal stenosis and mild/age congruent degenerative changes. Negative thoracic spinal cord and cauda equina. 2. Degenerative anterior vertebral endplate edema at H7-D4. Otherwise no osseous abnormality. 3.  Moderately to severely distended viscus in the lower abdomen and pelvis appears to be the distal colon.   Electronically Signed   By: Genevie Ann M.D.   On: 04/29/2015 15:29   I have personally reviewed and evaluated these images and lab results as part of my medical decision-making.   EKG Interpretation   Date/Time:  Sunday April 29 2015 12:32:29 EDT Ventricular Rate:  84 PR Interval:  191 QRS Duration: 88 QT Interval:  367 QTC Calculation: 434 R Axis:   -65 Text Interpretation:  Sinus rhythm Probable left atrial enlargement Left  anterior fascicular block RSR' in V1 or V2, right VCD or RVH Reconfirmed  by [facility] MD, Corene Cornea [phone]) on 04/29/2015 2:41:03 PM      MDM   Final diagnoses:  Weakness of both legs  Neuropathy   81 year old female with new onset para seizures bilateral lower extremities with a feeling of swelling and decreased strength in bilateral lower extremities as well. Today had episode of dizziness which has improved. Neuro exam normal as above. No demonstrable unilateral or focal neurologic findings. Strength in her lower extremities. She has a remote history of colon cancer so MRI done today she had any mass effect on her spinal cord and this was negative. Chest has a history of head bleed so CT was to evaluate and was negative. 2 sutures was a reactivation of strokelike symptoms checked EKG and labs which were all negative. Patient's with an unchanged neuro exam at time of discharge. Started on Neurontin for neuropathy. Unsure of cause of the transient lightheadedness she will follow-up with her doctor.  I have personally and contemperaneously reviewed labs and imaging and used in my decision making as above.   A medical screening exam was performed and I feel the patient has had an appropriate workup for their chief complaint at this time and likelihood of emergent condition existing is low. They have been counseled on decision, discharge, follow up and which symptoms  necessitate immediate return to the emergency department. They or their family verbally stated understanding and agreement with plan and discharged in stable condition.      Merrily Pew, MD 04/29/15 [phone]

## 2015-04-29 NOTE — ED Notes (Signed)
Patient transported to MRI 

## 2015-04-29 NOTE — ED Notes (Signed)
Patient transported to CT 

## 2015-04-29 NOTE — ED Notes (Signed)
Patient returned to room from Radiology. 

## 2016-08-07 ENCOUNTER — Encounter (HOSPITAL_COMMUNITY): Payer: Self-pay | Admitting: Neurosurgery

## 2017-11-11 ENCOUNTER — Other Ambulatory Visit
Admission: RE | Admit: 2017-11-11 | Discharge: 2017-11-11 | Disposition: A | Discharge status: Home / Self Care | Payer: Medicare Other | Source: Ambulatory Visit | Attending: Student | Admitting: Student

## 2017-11-11 DIAGNOSIS — K529 Noninfective gastroenteritis and colitis, unspecified: Secondary | ICD-10-CM | POA: Diagnosis present

## 2017-11-11 LAB — GASTROINTESTINAL PANEL BY PCR, STOOL (REPLACES STOOL CULTURE)
Adenovirus F40/41: NOT DETECTED
Astrovirus: NOT DETECTED
CRYPTOSPORIDIUM: NOT DETECTED
CYCLOSPORA CAYETANENSIS: NOT DETECTED
Campylobacter species: NOT DETECTED
ENTAMOEBA HISTOLYTICA: NOT DETECTED
ENTEROTOXIGENIC E COLI (ETEC): NOT DETECTED
Enteroaggregative E coli (EAEC): NOT DETECTED
Enteropathogenic E coli (EPEC): NOT DETECTED
Giardia lamblia: NOT DETECTED
Norovirus GI/GII: NOT DETECTED
Plesimonas shigelloides: NOT DETECTED
Rotavirus A: NOT DETECTED
SALMONELLA SPECIES: NOT DETECTED
SAPOVIRUS (I, II, IV, AND V): NOT DETECTED
SHIGA LIKE TOXIN PRODUCING E COLI (STEC): NOT DETECTED
SHIGELLA/ENTEROINVASIVE E COLI (EIEC): NOT DETECTED
VIBRIO SPECIES: NOT DETECTED
Vibrio cholerae: NOT DETECTED
YERSINIA ENTEROCOLITICA: NOT DETECTED

## 2017-11-16 ENCOUNTER — Other Ambulatory Visit
Admission: RE | Admit: 2017-11-16 | Discharge: 2017-11-16 | Disposition: A | Discharge status: Home / Self Care | Payer: Medicare Other | Source: Ambulatory Visit | Attending: Student | Admitting: Student

## 2017-11-16 DIAGNOSIS — K529 Noninfective gastroenteritis and colitis, unspecified: Secondary | ICD-10-CM | POA: Diagnosis present

## 2017-11-16 LAB — C DIFFICILE QUICK SCREEN W PCR REFLEX
C DIFFICILE (CDIFF) TOXIN: NEGATIVE
C DIFFICLE (CDIFF) ANTIGEN: NEGATIVE
C Diff interpretation: NOT DETECTED

## 2017-11-18 LAB — CALPROTECTIN, FECAL: CALPROTECTIN, FECAL: 33 ug/g (ref 0–120)

## 2017-11-18 LAB — PANCREATIC ELASTASE, FECAL: Pancreatic Elastase-1, Stool: >500 ug Elast./g (ref >200)

## 2018-02-16 ENCOUNTER — Other Ambulatory Visit: Payer: Self-pay

## 2018-02-16 ENCOUNTER — Emergency Department: Payer: Medicare Other

## 2018-02-16 ENCOUNTER — Emergency Department
Admission: EM | Admit: 2018-02-16 | Discharge: 2018-02-16 | Disposition: A | Discharge status: Home / Self Care | Payer: Medicare Other | Attending: Emergency Medicine | Admitting: Emergency Medicine

## 2018-02-16 ENCOUNTER — Encounter: Payer: Self-pay | Admitting: Radiology

## 2018-02-16 DIAGNOSIS — I10 Essential (primary) hypertension: Secondary | ICD-10-CM | POA: Insufficient documentation

## 2018-02-16 DIAGNOSIS — R109 Unspecified abdominal pain: Secondary | ICD-10-CM | POA: Diagnosis present

## 2018-02-16 DIAGNOSIS — K5900 Constipation, unspecified: Secondary | ICD-10-CM | POA: Insufficient documentation

## 2018-02-16 DIAGNOSIS — Z8673 Personal history of transient ischemic attack (TIA), and cerebral infarction without residual deficits: Secondary | ICD-10-CM | POA: Diagnosis not present

## 2018-02-16 DIAGNOSIS — Z79899 Other long term (current) drug therapy: Secondary | ICD-10-CM | POA: Diagnosis not present

## 2018-02-16 DIAGNOSIS — F329 Major depressive disorder, single episode, unspecified: Secondary | ICD-10-CM | POA: Diagnosis not present

## 2018-02-16 LAB — CBC WITH DIFFERENTIAL/PLATELET
Basophils Absolute: 0 10*3/uL (ref 0–0.1)
Basophils Relative: 0 %
EOS PCT: 0 %
Eosinophils Absolute: 0 10*3/uL (ref 0–0.7)
HCT: 41.6 % (ref 35.0–47.0)
Hemoglobin: 14 g/dL (ref 12.0–16.0)
LYMPHS ABS: 0.9 10*3/uL — AB (ref 1.0–3.6)
Lymphocytes Relative: 8 %
MCH: 29 pg (ref 26.0–34.0)
MCHC: 33.5 g/dL (ref 32.0–36.0)
MCV: 86.4 fL (ref 80.0–100.0)
MONO ABS: 0.6 10*3/uL (ref 0.2–0.9)
MONOS PCT: 6 %
Neutro Abs: 9.3 10*3/uL — ABNORMAL HIGH (ref 1.4–6.5)
Neutrophils Relative %: 86 %
PLATELETS: 276 10*3/uL (ref 150–440)
RBC: 4.82 MIL/uL (ref 3.80–5.20)
RDW: 14.3 % (ref 11.5–14.5)
WBC: 10.8 10*3/uL (ref 3.6–11.0)

## 2018-02-16 LAB — COMPREHENSIVE METABOLIC PANEL
ALK PHOS: 77 U/L (ref 38–126)
ALT: 25 U/L (ref 0–44)
ANION GAP: 8 (ref 5–15)
AST: 26 U/L (ref 15–41)
Albumin: 4 g/dL (ref 3.5–5.0)
BUN: 30 mg/dL — ABNORMAL HIGH (ref 8–23)
CALCIUM: 10.2 mg/dL (ref 8.9–10.3)
CHLORIDE: 103 mmol/L (ref 98–111)
CO2: 29 mmol/L (ref 22–32)
Creatinine, Ser: 1.02 mg/dL — ABNORMAL HIGH (ref 0.44–1.00)
GFR calc non Af Amer: 49 mL/min — ABNORMAL LOW (ref >60)
GFR, EST AFRICAN AMERICAN: 57 mL/min — AB (ref >60)
Glucose, Bld: 142 mg/dL — ABNORMAL HIGH (ref 70–99)
Potassium: 4.7 mmol/L (ref 3.5–5.1)
Sodium: 140 mmol/L (ref 135–145)
Total Bilirubin: 0.7 mg/dL (ref 0.3–1.2)
Total Protein: 7.3 g/dL (ref 6.5–8.1)

## 2018-02-16 LAB — LIPASE, BLOOD: LIPASE: 37 U/L (ref 11–51)

## 2018-02-16 MED ORDER — ONDANSETRON HCL 4 MG/2ML IJ SOLN
4.0000 mg | Freq: Once | INTRAMUSCULAR | Status: AC
  Administered 2018-02-16: 4 mg via INTRAVENOUS
  Filled 2018-02-16: qty 2

## 2018-02-16 MED ORDER — IOHEXOL 300 MG/ML  SOLN
75.0000 mL | Freq: Once | INTRAMUSCULAR | Status: AC | PRN
  Administered 2018-02-16: 75 mL via INTRAVENOUS

## 2018-02-16 MED ORDER — MINERAL OIL RE ENEM
1.0000 | ENEMA | Freq: Once | RECTAL | Status: AC
  Administered 2018-02-16: 1 via RECTAL

## 2018-02-16 MED ORDER — DOCUSATE SODIUM 100 MG PO CAPS
100.0000 mg | ORAL_CAPSULE | Freq: Once | ORAL | Status: DC
Start: 2018-02-16 — End: 2018-02-16
  Filled 2018-02-16: qty 1

## 2018-02-16 MED ORDER — DOCUSATE SODIUM 50 MG/5ML PO LIQD
100.0000 mg | Freq: Once | ORAL | Status: AC
  Administered 2018-02-16: 100 mg via ORAL
  Filled 2018-02-16: qty 10

## 2018-02-16 MED ORDER — MAGNESIUM CITRATE PO SOLN
1.0000 | Freq: Once | ORAL | Status: AC
  Administered 2018-02-16: 1 via ORAL
  Filled 2018-02-16: qty 296

## 2018-02-16 MED ORDER — SODIUM CHLORIDE 0.9 % IV BOLUS
1000.0000 mL | Freq: Once | INTRAVENOUS | Status: AC
  Administered 2018-02-16: 1000 mL via INTRAVENOUS

## 2018-02-16 NOTE — ED Notes (Signed)
Pt had large BM - she was cleaned and repositioned

## 2018-02-16 NOTE — ED Triage Notes (Signed)
Pt arrived from [place] via EMS with complaints of abdominal pain/distention and diarrhea. EMS reported that patient has had multiple loose bowels today with the last one being around 11:00pm tonight. Pt has Hx IBS. Pt was given Imodium at 8:00pm. Pt has had nausea with no vomiting. VS per EMS  BP-140/80. Pt can stand and pivot. Pt alert and oriented x 4. Pt has flat affect and that is her baseline.

## 2018-02-16 NOTE — ED Notes (Signed)
Attempted to cal [facility] for transportation of pt back to facility and to give report - no answer

## 2018-02-16 NOTE — ED Notes (Signed)
Pt states that he daughter normally comes to get her but she is recovering from hip surgery and cannot provide a ride - Network engineer notified to call EMS for transport back to facility

## 2018-02-16 NOTE — ED Provider Notes (Signed)
[facility] Emergency Department Provider Note ___   First MD Initiated Contact with Patient 02/16/18 0144     (approximate)  I have reviewed the triage vital signs and the nursing notes.   HISTORY  Chief Complaint Abdominal Pain and Diarrhea    HPI Diana Hawkins is a 84 y.o. female below list of chronic medical conditions presents to the emergency department via EMS from [place] with abdominal pain and distention with one episode of loose stools yesterday.  Patient states current discomfort 5 out of 10.  Patient denies any vomiting but does admit to nausea.  Patient afebrile temperature 97.9 on arrival.   Past Medical History:  Diagnosis Date  . Anemia 07/2008   extreme anemia  . Depressive disorder   . Hepatitis A 01/1976  . Hypertension   . Irregular heartbeat 02/2001   Irregular heartbeat checked  . Stroke [facility])     Patient Active Problem List   Diagnosis Date Noted  . Cognitive deficit, post-stroke 10/04/2014  . Trochanteric bursitis of left hip 10/04/2014  . SAH (subarachnoid hemorrhage) ([place]) 09/26/2014  . Subarachnoid hemorrhage ([place]) 09/22/2014    Past Surgical History:  Procedure Laterality Date  . ABDOMINAL HYSTERECTOMY  01/1968   Complete   . BREAST SURGERY Right 11/1966   Benign tumor right breast removed  . CATARACT EXTRACTION W/ INTRAOCULAR LENS IMPLANT Bilateral 04/03 L eye; 06/03 R eye  . COLON SURGERY  1/10   Stage 3 Cancer  . IR GENERIC HISTORICAL  09/22/2014   IR ANGIO INTRA EXTRACRAN SEL COM CAROTID INNOMINATE BILAT MOD SED 09/22/2014 [name], MD [facility]-INTERV RAD  . IR GENERIC HISTORICAL  09/22/2014   IR ANGIO VERTEBRAL SEL VERTEBRAL UNI R MOD SED 09/22/2014 [name], MD [facility]-INTER[facility] RAD  . OOPHORECTOMY Right 08/1964   Right Ovary Removed, benign tumor  . PANCREATIC CYST DRAINAGE  03/2012   Benign    Prior to Admission medications   Medication Sig Start Date End Date Taking? Authorizing  Provider  acetaminophen (TYLENOL) 325 MG tablet Take 650 mg by mouth every 6 (six) hours as needed.    [provider]  Biotin 300 MCG TABS Take 300 mcg by mouth daily.    [provider]  Cholecalciferol (VITAMIN D PO) Take 1 tablet by mouth daily.    [provider]  gabapentin (NEURONTIN) 100 MG capsule Take 1 capsule (100 mg total) by mouth 3 (three) times daily. 04/29/15   Mesner, Corene Cornea, MD  loratadine (CLARITIN) 10 MG tablet Take 10 mg by mouth daily.    [provider]  mirtazapine (REMERON) 15 MG tablet Take 15 mg by mouth at bedtime.    [provider]  Multiple Vitamins-Minerals (MULTIVITAMIN WITH MINERALS) tablet Take 1 tablet by mouth 2 (two) times daily.    [provider]  polyethylene glycol (MIRALAX / GLYCOLAX) packet Take 17 g by mouth daily as needed for moderate constipation.     [provider]  Probiotic Product (PROBIOTIC DAILY PO) Take 1 capsule by mouth daily.    [provider]  Psyllium (REGULOID) 28.3 % POWD Take 2.5 mLs by mouth daily as needed (for fiber).    [provider]    Allergies Codeine and Reglan [metoclopramide]  No family history on file.  Social History Social History   Tobacco Use  . Smoking status: Never Smoker  Substance Use Topics  . Alcohol use: No    Alcohol/week: 0.0 oz  . Drug use:  No    Review of Systems Constitutional: No fever/chills Eyes: No visual changes. ENT: No sore throat. Cardiovascular: Denies chest pain. Respiratory: Denies shortness of breath. Gastrointestinal: Positive for abdominal pain distention and nausea Genitourinary: Negative for dysuria. Musculoskeletal: Negative for neck pain.  Negative for back pain. Integumentary: Negative for rash. Neurological: Negative for headaches, focal weakness or numbness.   ____________________________________________   PHYSICAL EXAM:  VITAL SIGNS: ED Triage Vitals  Enc Vitals Group     BP  02/16/18 0151 137/70     Pulse Rate 02/16/18 0151 92     Resp 02/16/18 0151 17     Temp 02/16/18 0151 97.9 F (36.6 C)     Temp Source 02/16/18 0151 Oral     SpO2 02/16/18 0151 100 %     Weight 02/16/18 0152 57.6 kg (127 lb)     Height 02/16/18 0152 1.651 m (5\' 5" )     Head Circumference --      Peak Flow --      Pain Score --      Pain Loc --      Pain Edu? --      Excl. in [place]? --     Constitutional: Alert and oriented. Well appearing and in no acute distress. Eyes: Conjunctivae are normal. Head: Atraumatic. Mouth/Throat: Mucous membranes are moist.  Oropharynx non-erythematous. Neck: No stridor.   Cardiovascular: Normal rate, regular rhythm. Good peripheral circulation. Grossly normal heart sounds. Respiratory: Normal respiratory effort.  No retractions. Lungs CTAB. Gastrointestinal: Soft and nontender. No distention.   Musculoskeletal: No lower extremity tenderness nor edema. No gross deformities of extremities. Neurologic:  Normal speech and language. No gross focal neurologic deficits are appreciated.  Skin:  Skin is warm, dry and intact. No rash noted. Psychiatric: Mood and affect are normal. Speech and behavior are normal.  ____________________________________________   LABS (all labs ordered are listed, but only abnormal results are displayed)  Labs Reviewed  CBC WITH DIFFERENTIAL/PLATELET - Abnormal; Notable for the following components:      Result Value   Neutro Abs 9.3 (*)    Lymphs Abs 0.9 (*)    All other components within normal limits  COMPREHENSIVE METABOLIC PANEL - Abnormal; Notable for the following components:   Glucose, Bld 142 (*)    BUN 30 (*)    Creatinine, Ser 1.02 (*)    GFR calc non Af Amer 49 (*)    GFR calc Af Amer 57 (*)    All other components within normal limits  LIPASE, BLOOD    RADIOLOGY I, [place] Ailea Rhatigan, personally viewed and evaluated these images (plain radiographs) as part of my medical decision making, as well as reviewing  the written report by the radiologist.  ED MD interpretation: Severe dilation of the sigmoid colon with large stool bolus consistent with possible constipation per radiologist.  Official radiology report(s): Ct Abdomen Pelvis W Contrast  Result Date: 02/16/2018 CLINICAL DATA:  84 year old female with abdominal pain. EXAM: CT ABDOMEN AND PELVIS WITH CONTRAST TECHNIQUE: Multidetector CT imaging of the abdomen and pelvis was performed using the standard protocol following bolus administration of intravenous contrast. CONTRAST:  63mL OMNIPAQUE IOHEXOL 300 MG/ML  SOLN COMPARISON:  None. FINDINGS: Lower chest: The visualized lung bases are clear. No intra-abdominal free air or free fluid. Hepatobiliary: The liver is unremarkable. There is mild intrahepatic biliary ductal dilatation. The gallbladder is unremarkable as visualized. Pancreas: The pancreas is grossly unremarkable as visualized. Evaluation of the pancreas however is limited due to  suboptimal visualization. Spleen: Normal in size without focal abnormality. Adrenals/Urinary Tract: The adrenal glands are unremarkable. Nonobstructing 3 mm left renal inferior pole calculus. No hydronephrosis. Multiple right renal hypodense lesions measuring up to 15 mm. The larger lesions demonstrate fluid attenuation consistent with cysts. The smaller lesions are too small to characterize. There is no hydronephrosis on either side. There is symmetric enhancement and excretion of contrast by both kidneys. The urinary bladder is grossly unremarkable. Stomach/Bowel: There is severe distension of the sigmoid colon measuring approximately 10 cm in diameter. A large stool as well as air noted in the sigmoid colon and rectal vault and appears to extend inferior to the anal sphincter. There is large amount of stool in the distal colon as well as in the cecum. There is air distention of the transverse colon measuring 6 cm in diameter. No evidence of twisting. Air distended small  bowel loops without evidence of obstruction. Vascular/Lymphatic: Moderate aortoiliac atherosclerotic disease. No portal venous gas. No adenopathy. Reproductive: Hysterectomy. Other: Midline vertical anterior pelvic wall incisional scar. Musculoskeletal: Osteopenia with degenerative changes of the spine. No acute osseous pathology. IMPRESSION: 1. Severe dilatation of the sigmoid colon with large amount of stool and air. Stool extends from the sigmoid colon into the rectum and appears to extend external to the anal sphincter. Correlation with clinical exam recommended. Findings consistent with severe constipation or obstipation with fecal impaction. There is also large amount of stool in the distal colon. No evidence of colonic twisting or small-bowel obstruction. 2. Small nonobstructing left renal inferior pole stone. No hydronephrosis. 3.  Aortic Atherosclerosis (ICD10-I70.0). Electronically Signed   By: Anner Crete M.D.   On: 02/16/2018 03:36      Procedures   ____________________________________________   INITIAL IMPRESSION / ASSESSMENT AND PLAN / ED COURSE  As part of my medical decision making, I reviewed the following data within the electronic MEDICAL RECORD NUMBER   84 year old female presented to the emergency department above-stated history and physical exam of abdominal pain distention and nausea.  CT scan revealed markedly dilated sigmoid colon with large volume stool concerning for constipation.  Patient given an enema in the emergency department with a large BM. ____________________________________________  FINAL CLINICAL IMPRESSION(S) / ED DIAGNOSES  Final diagnoses:  Constipation, unspecified constipation type     MEDICATIONS GIVEN DURING THIS VISIT:  Medications  ondansetron (ZOFRAN) injection 4 mg (4 mg Intravenous Given 02/16/18 0227)  sodium chloride 0.9 % bolus 1,000 mL (1,000 mLs Intravenous New Bag/Given 02/16/18 0227)  iohexol (OMNIPAQUE) 300 MG/ML solution 75 mL  (75 mLs Intravenous Contrast Given 02/16/18 0252)  magnesium citrate solution 1 Bottle (1 Bottle Oral Given 02/16/18 0528)  mineral oil enema 1 enema (1 enema Rectal Given 02/16/18 0528)  docusate (COLACE) 50 MG/5ML liquid 100 mg (100 mg Oral Given 02/16/18 0529)     ED Discharge Orders    None       Note:  This document was prepared using Dragon voice recognition software and may include unintentional dictation errors.    Gregor Hams, MD 02/16/18 [phone]

## 2018-06-29 ENCOUNTER — Telehealth: Payer: Self-pay | Admitting: *Deleted

## 2018-06-29 NOTE — Telephone Encounter (Signed)
Home Health Nurse (unable to hear name due to traffic noise) called and wanted orders for Home Health. Stated that she was Dr. Tobey Grim patient.   Informed nurse that this was NOT our patient nor Provider.   She stated that she would call the patient's daughter.

## 2018-07-06 ENCOUNTER — Emergency Department: Payer: Medicare Other

## 2018-07-06 ENCOUNTER — Other Ambulatory Visit: Payer: Self-pay

## 2018-07-06 ENCOUNTER — Emergency Department
Admission: EM | Admit: 2018-07-06 | Discharge: 2018-07-06 | Disposition: A | Discharge status: Home / Self Care | Payer: Medicare Other | Attending: Emergency Medicine | Admitting: Emergency Medicine

## 2018-07-06 DIAGNOSIS — K5900 Constipation, unspecified: Secondary | ICD-10-CM

## 2018-07-06 DIAGNOSIS — Z79899 Other long term (current) drug therapy: Secondary | ICD-10-CM | POA: Insufficient documentation

## 2018-07-06 DIAGNOSIS — I10 Essential (primary) hypertension: Secondary | ICD-10-CM | POA: Diagnosis not present

## 2018-07-06 DIAGNOSIS — R109 Unspecified abdominal pain: Secondary | ICD-10-CM | POA: Diagnosis present

## 2018-07-06 DIAGNOSIS — I69319 Unspecified symptoms and signs involving cognitive functions following cerebral infarction: Secondary | ICD-10-CM | POA: Diagnosis not present

## 2018-07-06 LAB — COMPREHENSIVE METABOLIC PANEL
ALK PHOS: 72 U/L (ref 38–126)
ALT: 15 U/L (ref 0–44)
AST: 22 U/L (ref 15–41)
Albumin: 3.8 g/dL (ref 3.5–5.0)
Anion gap: 11 (ref 5–15)
BUN: 33 mg/dL — AB (ref 8–23)
CALCIUM: 9.5 mg/dL (ref 8.9–10.3)
CO2: 28 mmol/L (ref 22–32)
CREATININE: 0.64 mg/dL (ref 0.44–1.00)
Chloride: 103 mmol/L (ref 98–111)
GFR calc Af Amer: >60 mL/min (ref >60)
Glucose, Bld: 148 mg/dL — ABNORMAL HIGH (ref 70–99)
Potassium: 3.9 mmol/L (ref 3.5–5.1)
Sodium: 142 mmol/L (ref 135–145)
Total Bilirubin: 1 mg/dL (ref 0.3–1.2)
Total Protein: 7 g/dL (ref 6.5–8.1)

## 2018-07-06 LAB — CBC WITH DIFFERENTIAL/PLATELET
Abs Immature Granulocytes: 0.07 10*3/uL (ref 0.00–0.07)
BASOS ABS: 0 10*3/uL (ref 0.0–0.1)
Basophils Relative: 0 %
EOS ABS: 0 10*3/uL (ref 0.0–0.5)
EOS PCT: 0 %
HEMATOCRIT: 40 % (ref 36.0–46.0)
HEMOGLOBIN: 13 g/dL (ref 12.0–15.0)
Immature Granulocytes: 1 %
LYMPHS ABS: 0.8 10*3/uL (ref 0.7–4.0)
LYMPHS PCT: 7 %
MCH: 28.5 pg (ref 26.0–34.0)
MCHC: 32.5 g/dL (ref 30.0–36.0)
MCV: 87.7 fL (ref 80.0–100.0)
MONO ABS: 0.4 10*3/uL (ref 0.1–1.0)
Monocytes Relative: 3 %
NRBC: 0 % (ref 0.0–0.2)
Neutro Abs: 10.2 10*3/uL — ABNORMAL HIGH (ref 1.7–7.7)
Neutrophils Relative %: 89 %
Platelets: 270 10*3/uL (ref 150–400)
RBC: 4.56 MIL/uL (ref 3.87–5.11)
RDW: 13.9 % (ref 11.5–15.5)
WBC: 11.5 10*3/uL — ABNORMAL HIGH (ref 4.0–10.5)

## 2018-07-06 MED ORDER — IOHEXOL 300 MG/ML  SOLN
75.0000 mL | Freq: Once | INTRAMUSCULAR | Status: AC | PRN
  Administered 2018-07-06: 75 mL via INTRAVENOUS

## 2018-07-06 MED ORDER — SENNOSIDES-DOCUSATE SODIUM 8.6-50 MG PO TABS: 2.0000 | ORAL_TABLET | Freq: Two times a day (BID) | ORAL | 0 refills | Status: DC

## 2018-07-06 MED ORDER — POLYETHYLENE GLYCOL 3350 17 GM/SCOOP PO POWD: 17.0000 g | Freq: Every day | ORAL | 3 refills | Status: DC

## 2018-07-06 MED ORDER — SODIUM CHLORIDE 0.9 % IV BOLUS
1000.0000 mL | Freq: Once | INTRAVENOUS | Status: AC
  Administered 2018-07-06: 1000 mL via INTRAVENOUS

## 2018-07-06 MED ORDER — SORBITOL 70 % SOLN
960.0000 mL | TOPICAL_OIL | Freq: Once | ORAL | Status: AC
  Administered 2018-07-06: 960 mL via RECTAL
  Filled 2018-07-06: qty 473

## 2018-07-06 MED ORDER — SORBITOL 70 % SOLN
960.0000 mL | TOPICAL_OIL | ORAL | Status: DC
  Filled 2018-07-06: qty 473

## 2018-07-06 NOTE — ED Provider Notes (Signed)
[facility] Emergency Department Provider Note  ____________________________________________  Time seen: Approximately 4:33 PM  I have reviewed the triage vital signs and the nursing notes.   HISTORY  Chief Complaint Abdominal Pain and Emesis  Level 5 Caveat: Portions of the History and Physical including HPI and review of systems are unable to be completely obtained due to patient being a poor historian    HPI Diana Hawkins is a 84 y.o. female with a history of stroke resulting in permanent cognitive disability, hypertension, subarachnoid hemorrhage in the past who comes to the ED today via EMS with complaints of abdominal pain and distention that started about 8 hours ago associated vomiting as well today.  Patient had a very small amount of breakfast consisting of a piece of toast was unable to tolerate more than that.  No diarrhea.  No fevers chills or sweats.  Review of electronic medical record shows that patient previously was treated for severe constipation.      Past Medical History:  Diagnosis Date  . Anemia 07/2008   extreme anemia  . Depressive disorder   . Hepatitis A 01/1976  . Hypertension   . Irregular heartbeat 02/2001   Irregular heartbeat checked  . Stroke [facility])      Patient Active Problem List   Diagnosis Date Noted  . Cognitive deficit, post-stroke 10/04/2014  . Trochanteric bursitis of left hip 10/04/2014  . SAH (subarachnoid hemorrhage) ([place]) 09/26/2014  . Subarachnoid hemorrhage ([place]) 09/22/2014     Past Surgical History:  Procedure Laterality Date  . ABDOMINAL HYSTERECTOMY  01/1968   Complete   . BREAST SURGERY Right 11/1966   Benign tumor right breast removed  . CATARACT EXTRACTION W/ INTRAOCULAR LENS IMPLANT Bilateral 04/03 L eye; 06/03 R eye  . COLON SURGERY  1/10   Stage 3 Cancer  . IR GENERIC HISTORICAL  09/22/2014   IR ANGIO INTRA EXTRACRAN SEL COM CAROTID INNOMINATE BILAT MOD SED 09/22/2014 [name], MD  [facility]-INTERV RAD  . IR GENERIC HISTORICAL  09/22/2014   IR ANGIO VERTEBRAL SEL VERTEBRAL UNI R MOD SED 09/22/2014 [name], MD [facility]INTER[facility]  . OOPHORECTOMY Right 08/1964   Right Ovary Removed, benign tumor  . PANCREATIC CYST DRAINAGE  03/2012   Benign     Prior to Admission medications   Medication Sig Start Date End Date Taking? Authorizing Provider  acetaminophen (TYLENOL) 650 MG CR tablet Take 650 mg by mouth at bedtime.   Yes [provider]  Biotin 300 MCG TABS Take 300 mcg by mouth every evening.    Yes [provider]  carboxymethylcellulose (REFRESH PLUS) 0.5 % SOLN Place 1 drop into both eyes 3 (three) times daily as needed (for dry eyes).   Yes [provider]  colestipol (COLESTID) 5 g packet Take 5 g by mouth every 12 (twelve) hours as needed (for diarrhea).   Yes [provider]  fluticasone (FLONASE) 50 MCG/ACT nasal spray Place 1 spray into both nostrils 2 (two) times daily.   Yes [provider]  furosemide (LASIX) 20 MG tablet Take 10 mg by mouth daily.   Yes [provider]  gabapentin (NEURONTIN) 100 MG capsule Take 1 capsule (100 mg total) by mouth 3 (three) times daily. Patient taking differently: Take 300 mg by mouth at bedtime.  04/29/15  Yes Mesner, Corene Cornea, MD  Lactase (LACTAID FAST ACT) 9000 units TABS Take 18,000 Units by mouth 2 (two) times daily.   Yes [provider]  mirtazapine (REMERON)  15 MG tablet Take 15 mg by mouth at bedtime.   Yes [provider]  Multiple Vitamin (DAILY VITE) TABS Take 1 tablet by mouth daily.   Yes [provider]  ondansetron (ZOFRAN-ODT) 8 MG disintegrating tablet Take 8 mg by mouth 3 (three) times daily as needed for nausea or vomiting.   Yes [provider]  [place] (OYSTER CALCIUM) 500 MG TABS tablet Take 1,000 mg of elemental calcium by mouth daily.   Yes [provider]  potassium chloride (K-DUR,KLOR-CON) 10 MEQ tablet Take  10 mEq by mouth daily.   Yes [provider]  Probiotic Product (RISA-BID PROBIOTIC) TABS Take 1 tablet by mouth every evening.   Yes [provider]  Psyllium (METAMUCIL) 28.3 % POWD Take 20 mLs by mouth as needed (for constipation).   Yes [provider]  simethicone (MYLICON) 80 MG chewable tablet Chew 80 mg by mouth every 6 (six) hours as needed for flatulence.   Yes [provider]  solifenacin (VESICARE) 5 MG tablet Take 5 mg by mouth daily.   Yes [provider]  polyethylene glycol powder (GLYCOLAX/MIRALAX) powder Take 17 g by mouth daily. 07/06/18 07/06/19  Carrie Mew, MD  senna-docusate (SENOKOT-S) 8.6-50 MG tablet Take 2 tablets by mouth 2 (two) times daily. 07/06/18   Carrie Mew, MD     Allergies Codeine and Reglan [metoclopramide]   History reviewed. No pertinent family history.  Social History Social History   Tobacco Use  . Smoking status: Never Smoker  . Smokeless tobacco: Never Used  Substance Use Topics  . Alcohol use: No    Alcohol/week: 0.0 standard drinks  . Drug use: No    Review of Systems  Constitutional:   No fever or chills.  ENT:   No sore throat. No rhinorrhea. Cardiovascular:   No chest pain or syncope. Respiratory:   No dyspnea or cough. Gastrointestinal: Positive as above for abdominal pain and vomiting. Musculoskeletal:   Negative for focal pain or swelling All other systems reviewed and are negative except as documented above in ROS and HPI.  ____________________________________________   PHYSICAL EXAM:  VITAL SIGNS: ED Triage Vitals  Enc Vitals Group     BP 07/06/18 1612 (!) 165/97     Pulse Rate 07/06/18 1612 69     Resp 07/06/18 1612 16     Temp 07/06/18 1612 98.5 F (36.9 C)     Temp Source 07/06/18 1612 Oral     SpO2 07/06/18 1612 100 %     Weight 07/06/18 1615 118 lb (53.5 kg)     Height 07/06/18 1615 5\' 5"  (1.651 m)     Head Circumference --      Peak Flow --       Pain Score 07/06/18 1615 6     Pain Loc --      Pain Edu? --      Excl. in [place]? --     Vital signs reviewed, nursing assessments reviewed.   Constitutional:   Alert and oriented.  Ill-appearing.. Eyes:   Conjunctivae are normal. EOMI. PERRL. ENT      Head:   Normocephalic and atraumatic.      Nose:   No congestion/rhinnorhea.       Mouth/Throat:   Dry mucous membranes, no pharyngeal erythema. No peritonsillar mass.       Neck:   No meningismus. Full ROM. Hematological/Lymphatic/Immunilogical:   No cervical lymphadenopathy. Cardiovascular:   RRR. Symmetric bilateral radial and DP pulses.  No murmurs. Cap refill less than 2 seconds. Respiratory:   Normal respiratory effort without tachypnea/retractions. Breath sounds are clear and equal bilaterally. No wheezes/rales/rhonchi. Gastrointestinal:   Abdomen is diffusely tender, significantly distended and somewhat tense.  No guarding or rigidity, some questionable rebound tenderness. Rectal exam performed with nurse Keane Police and Jinny Blossom at bedside. Large soft brown stool, no impaction. No large hemorrhoids, no gross blood. Musculoskeletal:   Normal range of motion in all extremities. No joint effusions.  No lower extremity tenderness.  No edema. Neurologic:   Normal speech and language.  Motor grossly intact. No acute focal neurologic deficits are appreciated.  Skin:    Skin is warm, dry and intact. No rash noted.  No petechiae, purpura, or bullae.  ____________________________________________    LABS (pertinent positives/negatives) (all labs ordered are listed, but only abnormal results are displayed) Labs Reviewed  COMPREHENSIVE METABOLIC PANEL - Abnormal; Notable for the following components:      Result Value   Glucose, Bld 148 (*)    BUN 33 (*)    All other components within normal limits  CBC WITH DIFFERENTIAL/PLATELET - Abnormal; Notable for the following components:   WBC 11.5 (*)    Neutro Abs 10.2 (*)    All other components  within normal limits   ____________________________________________   EKG    ____________________________________________    RADIOLOGY  Ct Abdomen Pelvis W Contrast  Result Date: 07/06/2018 CLINICAL DATA:  Abdominal distension. EXAM: CT ABDOMEN AND PELVIS WITH CONTRAST TECHNIQUE: Multidetector CT imaging of the abdomen and pelvis was performed using the standard protocol following bolus administration of intravenous contrast. CONTRAST:  21mL OMNIPAQUE IOHEXOL 300 MG/ML  SOLN COMPARISON:  CT scan of February 16, 2018. FINDINGS: Lower chest: No acute abnormality. Hepatobiliary: No focal liver abnormality is seen. No gallstones, gallbladder wall thickening, or biliary dilatation. Pancreas: Pancreas is not well visualized, but no definite inflammation or other abnormality is noted. Spleen: Normal in size without focal abnormality. Adrenals/Urinary Tract: Adrenal glands appear normal. Right renal cyst is noted. Mild bilateral hydroureteronephrosis is noted without obstructing calculus. Mild urinary bladder distention is noted. Stomach/Bowel: Nasogastric tube is seen in the proximal stomach. The appendix is not visualized. There remains severely distended and stool filled sigmoid colon and rectum. No small bowel dilatation is noted. Vascular/Lymphatic: Aortic atherosclerosis. No enlarged abdominal or pelvic lymph nodes. Reproductive: Status post hysterectomy. No adnexal masses. Other: No abdominal wall hernia or abnormality. No abdominopelvic ascites. Musculoskeletal: No acute or significant osseous findings. IMPRESSION: Stable severely distended and stool-filled sigmoid colon and rectum is noted. This is concerning for constipation or impaction. Mild bilateral hydroureteronephrosis is noted without obstructing calculus. There appears to be mild urinary bladder distention which is compressed by previously described severely distended sigmoid colon and rectum. Aortic Atherosclerosis (ICD10-I70.0).  Electronically Signed   By: Marijo Conception, M.D.   On: 07/06/2018 17:26   Dg Abdomen Acute W/chest  Result Date: 07/06/2018 CLINICAL DATA:  Abdominal distention and pain, some vomiting, history of diarrhea EXAM: DG ABDOMEN ACUTE W/ 1V CHEST COMPARISON:  Chest x-ray of 10/05/2014, CT abdomen pelvis of 02/16/2018 FINDINGS: No active infiltrate or effusion is seen. There is slight elevation of the left hemidiaphragm. The heart is mildly enlarged. Mediastinal and hilar contours otherwise are unremarkable. No bony abnormality is seen. Supine and erect views the abdomen show no free intraperitoneal air. Similar to the scout film from CT of 02/16/2018, there appears to be distention of the sigmoid colon presumably due to fecal impaction  as noted on the prior CT. There is feces throughout the colon. No bowel wall edema is evident by plain film. Surgical clips are noted in the right abdomen. IMPRESSION: 1. No active lung disease. 2. Significant gaseous distention of the rectosigmoid colon most likely due to fecal impaction within the rectum similar to images of the CT of 02/16/2018. 3. No free air is seen. Electronically Signed   By: Ivar Drape M.D.   On: 07/06/2018 16:38    ____________________________________________   PROCEDURES Procedures  ____________________________________________  DIFFERENTIAL DIAGNOSIS   Bowel obstruction, bowel perforation, intestinal volvulus or intussusception  CLINICAL IMPRESSION / ASSESSMENT AND PLAN / ED COURSE  Pertinent labs & imaging results that were available during my care of the patient were reviewed by me and considered in my medical decision making (see chart for details).    Patient presents with painful distended abdomen, concerning for bowel obstruction.  Initial x-ray films do show severely dilated loops of bowel without air-fluid levels.  Raises suspicion for volvulus as well.  I will obtain a CT scan to further evaluate.    Initial vital signs are  normal.  Plan to place an NG tube for bowel decompression.  Clinical Course as of Jul 06 2001  Tue Jul 06, 2018  1639 CBC unremarkable  CBC with Differential(!) [PS]  4888 NGT placed. On connecting to suction, immediate return of 466mL gastric contents.  Tubing appears clogged after that, will flush and continue aspirating on low suction after CT.    [PS]  9169 CT shows large stool in rectum and sigmoid, associated with extremely large colonic gas collection. No evidence of obstruction, hernia, inflammation/infection or other acute disease. Will attempt to resolve sx with enema   [PS]  2002 Still waiting for pharmacy to send the enema.  Care of patient signed out to Dr. Alfred Levins.   [PS]    Clinical Course User Index [PS] Carrie Mew, MD     ____________________________________________   FINAL CLINICAL IMPRESSION(S) / ED DIAGNOSES    Final diagnoses:  Constipation, unspecified constipation type     ED Discharge Orders         Ordered    polyethylene glycol powder (GLYCOLAX/MIRALAX) powder  Daily     07/06/18 1916    senna-docusate (SENOKOT-S) 8.6-50 MG tablet  2 times daily     07/06/18 1916          Portions of this note were generated with dragon dictation software. Dictation errors may occur despite best attempts at proofreading.    Carrie Mew, MD 07/06/18 2003

## 2018-07-06 NOTE — ED Triage Notes (Signed)
Pt arrived via ems from [place]. Ems states pt experiencing abd distention starting 6-8 hrs ago. Vomiting since last night. Hx of diarrhea. Pt a&ox4 on arrival. abdominal distention noted on assessment, firm with palpation. 4mg  zofran administered by ems in route. Pt currently still nauseous and holding emesis bad during assessment.

## 2018-07-06 NOTE — ED Notes (Signed)
up to bedside commode

## 2018-07-06 NOTE — ED Provider Notes (Signed)
-----------------------------------------   11:12 PM on 07/06/2018 -----------------------------------------   Blood pressure 130/64, pulse 74, temperature 98.5 F (36.9 C), temperature source Oral, resp. rate 16, height 5\' 5"  (1.651 m), weight 53.5 kg, SpO2 97 %.  Assuming care from Dr. Joni Fears of Diana Hawkins is a 84 y.o. female with a chief complaint of Abdominal Pain and Emesis .    Please refer to H&P by previous MD for further details.  The current plan of care is to reassess after enema.   Patient received a smog enema with several bowel movements and full resolution of her symptoms.  She will be discharged home with the instructions left by Dr. Joni Fears.    Rudene Re, MD 07/06/18 2312

## 2018-07-06 NOTE — ED Notes (Signed)
Patient transported to CT 

## 2018-08-24 ENCOUNTER — Emergency Department: Payer: Medicare Other

## 2018-08-24 ENCOUNTER — Encounter: Payer: Self-pay | Admitting: Emergency Medicine

## 2018-08-24 ENCOUNTER — Inpatient Hospital Stay
Admission: EM | Admit: 2018-08-24 | Discharge: 2018-08-28 | DRG: 390 | Disposition: A | Discharge status: Nursing Home (Includes ICF) | Payer: Medicare Other | Source: Skilled Nursing Facility | Attending: Internal Medicine | Admitting: Internal Medicine

## 2018-08-24 ENCOUNTER — Other Ambulatory Visit: Payer: Self-pay

## 2018-08-24 DIAGNOSIS — E876 Hypokalemia: Secondary | ICD-10-CM | POA: Diagnosis not present

## 2018-08-24 DIAGNOSIS — R1030 Lower abdominal pain, unspecified: Secondary | ICD-10-CM | POA: Diagnosis not present

## 2018-08-24 DIAGNOSIS — Z85038 Personal history of other malignant neoplasm of large intestine: Secondary | ICD-10-CM

## 2018-08-24 DIAGNOSIS — K5900 Constipation, unspecified: Secondary | ICD-10-CM | POA: Diagnosis not present

## 2018-08-24 DIAGNOSIS — Z9049 Acquired absence of other specified parts of digestive tract: Secondary | ICD-10-CM

## 2018-08-24 DIAGNOSIS — R12 Heartburn: Secondary | ICD-10-CM | POA: Diagnosis not present

## 2018-08-24 DIAGNOSIS — E872 Acidosis, unspecified: Secondary | ICD-10-CM

## 2018-08-24 DIAGNOSIS — Z9842 Cataract extraction status, left eye: Secondary | ICD-10-CM

## 2018-08-24 DIAGNOSIS — I10 Essential (primary) hypertension: Secondary | ICD-10-CM | POA: Diagnosis present

## 2018-08-24 DIAGNOSIS — Z90710 Acquired absence of both cervix and uterus: Secondary | ICD-10-CM

## 2018-08-24 DIAGNOSIS — F0390 Unspecified dementia without behavioral disturbance: Secondary | ICD-10-CM | POA: Diagnosis present

## 2018-08-24 DIAGNOSIS — R109 Unspecified abdominal pain: Secondary | ICD-10-CM | POA: Diagnosis present

## 2018-08-24 DIAGNOSIS — Z79899 Other long term (current) drug therapy: Secondary | ICD-10-CM

## 2018-08-24 DIAGNOSIS — Z9841 Cataract extraction status, right eye: Secondary | ICD-10-CM

## 2018-08-24 DIAGNOSIS — Z993 Dependence on wheelchair: Secondary | ICD-10-CM

## 2018-08-24 DIAGNOSIS — Z90721 Acquired absence of ovaries, unilateral: Secondary | ICD-10-CM

## 2018-08-24 DIAGNOSIS — K5641 Fecal impaction: Secondary | ICD-10-CM | POA: Diagnosis not present

## 2018-08-24 DIAGNOSIS — R739 Hyperglycemia, unspecified: Secondary | ICD-10-CM | POA: Diagnosis present

## 2018-08-24 DIAGNOSIS — Z885 Allergy status to narcotic agent status: Secondary | ICD-10-CM

## 2018-08-24 DIAGNOSIS — Z961 Presence of intraocular lens: Secondary | ICD-10-CM | POA: Diagnosis present

## 2018-08-24 DIAGNOSIS — D72829 Elevated white blood cell count, unspecified: Secondary | ICD-10-CM

## 2018-08-24 DIAGNOSIS — K589 Irritable bowel syndrome without diarrhea: Secondary | ICD-10-CM | POA: Diagnosis present

## 2018-08-24 DIAGNOSIS — F329 Major depressive disorder, single episode, unspecified: Secondary | ICD-10-CM | POA: Diagnosis present

## 2018-08-24 DIAGNOSIS — Z888 Allergy status to other drugs, medicaments and biological substances status: Secondary | ICD-10-CM

## 2018-08-24 DIAGNOSIS — R000 Tachycardia, unspecified: Secondary | ICD-10-CM | POA: Diagnosis present

## 2018-08-24 DIAGNOSIS — Z66 Do not resuscitate: Secondary | ICD-10-CM | POA: Diagnosis present

## 2018-08-24 DIAGNOSIS — Z8673 Personal history of transient ischemic attack (TIA), and cerebral infarction without residual deficits: Secondary | ICD-10-CM

## 2018-08-24 LAB — COMPREHENSIVE METABOLIC PANEL
ALT: 16 U/L (ref 0–44)
AST: 26 U/L (ref 15–41)
Albumin: 4 g/dL (ref 3.5–5.0)
Alkaline Phosphatase: 81 U/L (ref 38–126)
Anion gap: 11 (ref 5–15)
BUN: 33 mg/dL — ABNORMAL HIGH (ref 8–23)
CHLORIDE: 101 mmol/L (ref 98–111)
CO2: 30 mmol/L (ref 22–32)
Calcium: 9.7 mg/dL (ref 8.9–10.3)
Creatinine, Ser: 1.24 mg/dL — ABNORMAL HIGH (ref 0.44–1.00)
GFR calc Af Amer: 46 mL/min — ABNORMAL LOW (ref >60)
GFR calc non Af Amer: 40 mL/min — ABNORMAL LOW (ref >60)
Glucose, Bld: 199 mg/dL — ABNORMAL HIGH (ref 70–99)
Potassium: 3.6 mmol/L (ref 3.5–5.1)
Sodium: 142 mmol/L (ref 135–145)
Total Bilirubin: 0.9 mg/dL (ref 0.3–1.2)
Total Protein: 7.5 g/dL (ref 6.5–8.1)

## 2018-08-24 LAB — URINALYSIS, COMPLETE (UACMP) WITH MICROSCOPIC
BACTERIA UA: NONE SEEN
Bilirubin Urine: NEGATIVE
Glucose, UA: NEGATIVE mg/dL
KETONES UR: NEGATIVE mg/dL
Nitrite: NEGATIVE
Protein, ur: 30 mg/dL — AB
Specific Gravity, Urine: >1.046 — ABNORMAL HIGH (ref 1.005–1.030)
WBC, UA: >50 WBC/hpf — ABNORMAL HIGH (ref 0–5)
pH: 6 (ref 5.0–8.0)

## 2018-08-24 LAB — CBC WITH DIFFERENTIAL/PLATELET
Abs Immature Granulocytes: 0.2 10*3/uL — ABNORMAL HIGH (ref 0.00–0.07)
Basophils Absolute: 0.1 10*3/uL (ref 0.0–0.1)
Basophils Relative: 0 %
Eosinophils Absolute: 0 10*3/uL (ref 0.0–0.5)
Eosinophils Relative: 0 %
HCT: 42.7 % (ref 36.0–46.0)
Hemoglobin: 13.7 g/dL (ref 12.0–15.0)
IMMATURE GRANULOCYTES: 1 %
Lymphocytes Relative: 2 %
Lymphs Abs: 0.4 10*3/uL — ABNORMAL LOW (ref 0.7–4.0)
MCH: 28.1 pg (ref 26.0–34.0)
MCHC: 32.1 g/dL (ref 30.0–36.0)
MCV: 87.5 fL (ref 80.0–100.0)
MONOS PCT: 4 %
Monocytes Absolute: 1.1 10*3/uL — ABNORMAL HIGH (ref 0.1–1.0)
Neutro Abs: 23.4 10*3/uL — ABNORMAL HIGH (ref 1.7–7.7)
Neutrophils Relative %: 93 %
Platelets: 313 10*3/uL (ref 150–400)
RBC: 4.88 MIL/uL (ref 3.87–5.11)
RDW: 14.7 % (ref 11.5–15.5)
Smear Review: NORMAL
WBC Morphology: INCREASED
WBC: 25.2 10*3/uL — AB (ref 4.0–10.5)
nRBC: 0 % (ref 0.0–0.2)

## 2018-08-24 LAB — LACTIC ACID, PLASMA: Lactic Acid, Venous: 3.7 mmol/L (ref 0.5–1.9)

## 2018-08-24 LAB — TROPONIN I

## 2018-08-24 LAB — LIPASE, BLOOD: Lipase: 244 U/L — ABNORMAL HIGH (ref 11–51)

## 2018-08-24 MED ORDER — ACETAMINOPHEN 325 MG PO TABS: 650.0000 mg | ORAL_TABLET | Freq: Four times a day (QID) | ORAL | Status: DC | PRN

## 2018-08-24 MED ORDER — FLUTICASONE PROPIONATE 50 MCG/ACT NA SUSP
1.0000 | Freq: Two times a day (BID) | NASAL | Status: DC
  Administered 2018-08-24 – 2018-08-28 (×8): 1 via NASAL
  Filled 2018-08-24: qty 16

## 2018-08-24 MED ORDER — COLESTIPOL HCL 5 G PO PACK
5.0000 g | PACK | Freq: Two times a day (BID) | ORAL | Status: DC | PRN
  Administered 2018-08-27: 5 g via ORAL
  Filled 2018-08-24 (×2): qty 1

## 2018-08-24 MED ORDER — SODIUM CHLORIDE 0.9 % IV BOLUS
500.0000 mL | Freq: Once | INTRAVENOUS | Status: AC
  Administered 2018-08-24: 500 mL via INTRAVENOUS

## 2018-08-24 MED ORDER — SODIUM CHLORIDE 0.9 % IV BOLUS
1000.0000 mL | Freq: Once | INTRAVENOUS | Status: AC
  Administered 2018-08-24: 1000 mL via INTRAVENOUS

## 2018-08-24 MED ORDER — ONDANSETRON HCL 4 MG PO TABS: 4.0000 mg | ORAL_TABLET | Freq: Four times a day (QID) | ORAL | Status: DC | PRN

## 2018-08-24 MED ORDER — ASPIRIN 81 MG PO CHEW
81.0000 mg | CHEWABLE_TABLET | Freq: Every day | ORAL | Status: DC
  Administered 2018-08-25 – 2018-08-28 (×4): 81 mg via ORAL
  Filled 2018-08-24 (×4): qty 1

## 2018-08-24 MED ORDER — FUROSEMIDE 20 MG PO TABS
10.0000 mg | ORAL_TABLET | Freq: Every day | ORAL | Status: DC
  Administered 2018-08-25 – 2018-08-28 (×4): 10 mg via ORAL
  Filled 2018-08-24 (×4): qty 1

## 2018-08-24 MED ORDER — SENNOSIDES-DOCUSATE SODIUM 8.6-50 MG PO TABS
2.0000 | ORAL_TABLET | Freq: Two times a day (BID) | ORAL | Status: DC
  Administered 2018-08-24 – 2018-08-27 (×7): 2 via ORAL
  Filled 2018-08-24 (×8): qty 2

## 2018-08-24 MED ORDER — FLEET ENEMA 7-19 GM/118ML RE ENEM: 1.0000 | ENEMA | Freq: Once | RECTAL | Status: DC

## 2018-08-24 MED ORDER — ACETAMINOPHEN 650 MG RE SUPP: 650.0000 mg | Freq: Four times a day (QID) | RECTAL | Status: DC | PRN

## 2018-08-24 MED ORDER — MIRTAZAPINE 15 MG PO TABS
15.0000 mg | ORAL_TABLET | Freq: Every day | ORAL | Status: DC
  Administered 2018-08-24 – 2018-08-27 (×4): 15 mg via ORAL
  Filled 2018-08-24 (×4): qty 1

## 2018-08-24 MED ORDER — IOPAMIDOL (ISOVUE-300) INJECTION 61%: 15.0000 mL | INTRAVENOUS | Status: AC

## 2018-08-24 MED ORDER — DOCUSATE SODIUM 100 MG PO CAPS
200.0000 mg | ORAL_CAPSULE | Freq: Two times a day (BID) | ORAL | Status: DC
  Administered 2018-08-24 – 2018-08-27 (×7): 200 mg via ORAL
  Filled 2018-08-24 (×8): qty 2

## 2018-08-24 MED ORDER — LACTASE 3000 UNITS PO TABS
18000.0000 [IU] | ORAL_TABLET | Freq: Two times a day (BID) | ORAL | Status: DC
  Administered 2018-08-24 – 2018-08-28 (×7): 18000 [IU] via ORAL
  Filled 2018-08-24 (×12): qty 6

## 2018-08-24 MED ORDER — ENOXAPARIN SODIUM 30 MG/0.3ML ~~LOC~~ SOLN
30.0000 mg | SUBCUTANEOUS | Status: DC
  Administered 2018-08-24 – 2018-08-25 (×2): 30 mg via SUBCUTANEOUS
  Filled 2018-08-24 (×2): qty 0.3

## 2018-08-24 MED ORDER — SODIUM CHLORIDE 0.9 % IV SOLN
INTRAVENOUS | Status: DC
  Administered 2018-08-24 – 2018-08-25 (×2): via INTRAVENOUS

## 2018-08-24 MED ORDER — DARIFENACIN HYDROBROMIDE ER 7.5 MG PO TB24
7.5000 mg | ORAL_TABLET | Freq: Every day | ORAL | Status: DC
  Administered 2018-08-25 – 2018-08-28 (×4): 7.5 mg via ORAL
  Filled 2018-08-24 (×4): qty 1

## 2018-08-24 MED ORDER — GABAPENTIN 300 MG PO CAPS
300.0000 mg | ORAL_CAPSULE | Freq: Every day | ORAL | Status: DC
  Administered 2018-08-24 – 2018-08-27 (×4): 300 mg via ORAL
  Filled 2018-08-24 (×4): qty 1

## 2018-08-24 MED ORDER — ONDANSETRON HCL 4 MG/2ML IJ SOLN
4.0000 mg | Freq: Four times a day (QID) | INTRAMUSCULAR | Status: DC | PRN
  Administered 2018-08-27: 4 mg via INTRAVENOUS
  Filled 2018-08-24: qty 2

## 2018-08-24 MED ORDER — IOHEXOL 300 MG/ML  SOLN
75.0000 mL | Freq: Once | INTRAMUSCULAR | Status: AC | PRN
  Administered 2018-08-24: 75 mL via INTRAVENOUS

## 2018-08-24 MED ORDER — ONDANSETRON HCL 4 MG/2ML IJ SOLN
4.0000 mg | Freq: Once | INTRAMUSCULAR | Status: AC
  Administered 2018-08-24: 4 mg via INTRAVENOUS
  Filled 2018-08-24: qty 2

## 2018-08-24 MED ORDER — POLYETHYLENE GLYCOL 3350 17 GM/SCOOP PO POWD
1.0000 | Freq: Once | ORAL | Status: DC
  Filled 2018-08-24: qty 255

## 2018-08-24 MED ORDER — PIPERACILLIN-TAZOBACTAM 3.375 G IVPB 30 MIN
3.3750 g | Freq: Once | INTRAVENOUS | Status: AC
  Administered 2018-08-24: 3.375 g via INTRAVENOUS
  Filled 2018-08-24: qty 50

## 2018-08-24 MED ORDER — ACETAMINOPHEN ER 650 MG PO TBCR: 650.0000 mg | EXTENDED_RELEASE_TABLET | Freq: Every day | ORAL | Status: DC

## 2018-08-24 NOTE — ED Provider Notes (Addendum)
[facility] Emergency Department Provider Note  ____________________________________________   I have reviewed the triage vital signs and the nursing notes. Where available I have reviewed prior notes and, if possible and indicated, outside hospital notes.    HISTORY  Chief Complaint Tachycardia    HPI Diana Hawkins is a 85 y.o. female w with the below symptoms, multiple prior visits to the emergency department for constipation and nausea states she has not had a bowel movement in 6 days and feels nauseated.  Did have some vomiting.  No fever no chills.  Does have some tenderness to her abdomen when I touch it but has not been complaining of significant downtime prior to this.  History as per EMS, and patient, limited history, patient does have some degree of dementia.    Past Medical History:  Diagnosis Date  . Anemia 07/2008   extreme anemia  . Depressive disorder   . Hepatitis A 01/1976  . Hypertension   . Irregular heartbeat 02/2001   Irregular heartbeat checked  . Stroke [facility])     Patient Active Problem List   Diagnosis Date Noted  . Cognitive deficit, post-stroke 10/04/2014  . Trochanteric bursitis of left hip 10/04/2014  . SAH (subarachnoid hemorrhage) ([place]) 09/26/2014  . Subarachnoid hemorrhage ([place]) 09/22/2014    Past Surgical History:  Procedure Laterality Date  . ABDOMINAL HYSTERECTOMY  01/1968   Complete   . BREAST SURGERY Right 11/1966   Benign tumor right breast removed  . CATARACT EXTRACTION W/ INTRAOCULAR LENS IMPLANT Bilateral 04/03 L eye; 06/03 R eye  . COLON SURGERY  1/10   Stage 3 Cancer  . IR GENERIC HISTORICAL  09/22/2014   IR ANGIO INTRA EXTRACRAN SEL COM CAROTID INNOMINATE BILAT MOD SED 09/22/2014 [name], MD [facility]  . IR GENERIC HISTORICAL  09/22/2014   IR ANGIO VERTEBRAL SEL VERTEBRAL UNI R MOD SED 09/22/2014 [name], MD [facility]  . OOPHORECTOMY Right 08/1964   Right Ovary Removed, benign  tumor  . PANCREATIC CYST DRAINAGE  03/2012   Benign    Prior to Admission medications   Medication Sig Start Date End Date Taking? Authorizing Provider  acetaminophen (TYLENOL) 650 MG CR tablet Take 650 mg by mouth at bedtime.    [provider]  Biotin 300 MCG TABS Take 300 mcg by mouth every evening.     [provider]  carboxymethylcellulose (REFRESH PLUS) 0.5 % SOLN Place 1 drop into both eyes 3 (three) times daily as needed (for dry eyes).    [provider]  colestipol (COLESTID) 5 g packet Take 5 g by mouth every 12 (twelve) hours as needed (for diarrhea).    [provider]  fluticasone (FLONASE) 50 MCG/ACT nasal spray Place 1 spray into both nostrils 2 (two) times daily.    [provider]  furosemide (LASIX) 20 MG tablet Take 10 mg by mouth daily.    [provider]  gabapentin (NEURONTIN) 100 MG capsule Take 1 capsule (100 mg total) by mouth 3 (three) times daily. Patient taking differently: Take 300 mg by mouth at bedtime.  04/29/15   Mesner, Corene Cornea, MD  Lactase (LACTAID FAST ACT) 9000 units TABS Take 18,000 Units by mouth 2 (two) times daily.    [provider]  mirtazapine (REMERON) 15 MG tablet Take 15 mg by mouth at bedtime.    [provider]  Multiple Vitamin (DAILY VITE) TABS Take 1 tablet by mouth daily.    [provider]  ondansetron (ZOFRAN-ODT) 8 MG disintegrating tablet Take 8 mg by mouth 3 (three) times daily as needed for nausea or vomiting.    [provider]  Loma Boston (OYSTER CALCIUM) 500 MG TABS tablet Take 1,000 mg of elemental calcium by mouth daily.    [provider]  polyethylene glycol powder (GLYCOLAX/MIRALAX) powder Take 17 g by mouth daily. 07/06/18 07/06/19  Carrie Mew, MD  potassium chloride (K-DUR,KLOR-CON) 10 MEQ tablet Take 10 mEq by mouth daily.    [provider]  Probiotic Product (RISA-BID PROBIOTIC) TABS Take 1 tablet by mouth every  evening.    [provider]  Psyllium (METAMUCIL) 28.3 % POWD Take 20 mLs by mouth as needed (for constipation).    [provider]  senna-docusate (SENOKOT-S) 8.6-50 MG tablet Take 2 tablets by mouth 2 (two) times daily. 07/06/18   Carrie Mew, MD  simethicone (MYLICON) 80 MG chewable tablet Chew 80 mg by mouth every 6 (six) hours as needed for flatulence.    [provider]  solifenacin (VESICARE) 5 MG tablet Take 5 mg by mouth daily.    [provider]    Allergies Codeine and Reglan [metoclopramide]  History reviewed. No pertinent family history.  Social History Social History   Tobacco Use  . Smoking status: Never Smoker  . Smokeless tobacco: Never Used  Substance Use Topics  . Alcohol use: No    Alcohol/week: 0.0 standard drinks  . Drug use: No    Review of Systems Constitutional: No fever/chills Eyes: No visual changes. ENT: No sore throat. No stiff neck no neck pain Cardiovascular: Denies chest pain. Respiratory: Denies shortness of breath. Gastrointestinal:   + vomiting.  No diarrhea.  No constipation. Genitourinary: Negative for dysuria. Musculoskeletal: Negative lower extremity swelling Skin: Negative for rash. Neurological: Negative for severe headaches, focal weakness or numbness.   ____________________________________________   PHYSICAL EXAM:  VITAL SIGNS: ED Triage Vitals  Enc Vitals Group     BP 08/24/18 1310 (!) 165/101     Pulse Rate 08/24/18 1310 83     Resp 08/24/18 1310 (!) 22     Temp 08/24/18 1310 98.6 F (37 C)     Temp Source 08/24/18 1310 Oral     SpO2 08/24/18 1310 100 %     Weight 08/24/18 1311 120 lb (54.4 kg)     Height 08/24/18 1311 5\' 5"  (1.651 m)     Head Circumference --      Peak Flow --      Pain Score 08/24/18 1310 4     Pain Loc --      Pain Edu? --      Excl. in [place]? --     Constitutional: Alert and to name and place unsure the date. Well appearing and in no acute  distress. Eyes: Conjunctivae are normal Head: Atraumatic HEENT: No congestion/rhinnorhea. Mucous membranes are moist.  Oropharynx non-erythematous Neck:   Nontender with no meningismus, no masses, no stridor Cardiovascular: Normal rate, regular rhythm. Grossly normal heart sounds.  Good peripheral circulation. Respiratory: Normal respiratory effort.  No retractions. Lungs CTAB. Abdominal: Soft and somewhat distended in appearance although not tympanic, not hard, no guarding or rebound but diffuse mild discomfort Back:  There is no focal tenderness or step off. Musculoskeletal: No lower extremity tenderness, no upper extremity tenderness. No joint effusions, no DVT signs strong distal pulses no edema Neurologic:  Normal speech and language. No gross focal neurologic deficits are appreciated.  Skin:  Skin is warm,  dry and intact. No rash noted. Psychiatric: Mood and affect are normal. Speech and behavior are normal.  ____________________________________________   LABS (all labs ordered are listed, but only abnormal results are displayed)  Labs Reviewed  CBC WITH DIFFERENTIAL/PLATELET  TROPONIN I  COMPREHENSIVE METABOLIC PANEL  LACTIC ACID, PLASMA  LACTIC ACID, PLASMA  URINALYSIS, COMPLETE (UACMP) WITH MICROSCOPIC  LIPASE, BLOOD    Pertinent labs  results that were available during my care of the patient were reviewed by me and considered in my medical decision making (see chart for details). ____________________________________________  EKG  I personally interpreted any EKGs ordered by me or triage Sinus rhythm rate 98 bpm, PACs PVCs noted, no acute ST elevation or depression, LAD LAFB no significant change from prior in terms of configuration ____________________________________________  RADIOLOGY  Pertinent labs & imaging results that were available during my care of the patient were reviewed by me and considered in my medical decision making (see chart for details). If  possible, patient and/or family made aware of any abnormal findings.  No results found. ____________________________________________    PROCEDURES  Procedure(s) performed: None  Procedures  Critical Care performed: CRITICAL CARE Performed by: Schuyler Amor   Total critical care time: 45 minutes  Critical care time was exclusive of separately billable procedures and treating other patients.  Critical care was necessary to treat or prevent imminent or life-threatening deterioration.  Critical care was time spent personally by me on the following activities: development of treatment plan with patient and/or surrogate as well as nursing, discussions with consultants, evaluation of patient's response to treatment, examination of patient, obtaining history from patient or surrogate, ordering and performing treatments and interventions, ordering and review of laboratory studies, ordering and review of radiographic studies, pulse oximetry and re-evaluation of patient's condition.   ____________________________________________   INITIAL IMPRESSION / ASSESSMENT AND PLAN / ED COURSE  Pertinent labs & imaging results that were available during my care of the patient were reviewed by me and considered in my medical decision making (see chart for details).  Patient with somewhat distended but not significantly tender, nonsurgical abdomen, history of repeated visits to the emergency room for failure of outpatient management at her nursing home to manage her constipation issues.  We will unfortunately have to do a CT scan to see if other pathology exists, differential includes blockage after all.  There is no evidence at this time that the patient has ACS or PE but will check a troponin, and we will reassess.  ----------------------------------------- 3:44 PM on 08/24/2018 -----------------------------------------  Unremarkable CT scan with significant distention from colonic retention, not  think enema in the emergency room is going to begin to decompress this, her lactic is somewhat elevated, I did asked the radiologist there is no evidence of ischemia that they can see, this is obviously been there for some time, I have noted her positive white count and her positive lactic I am giving her fluids and IV antibiotics, I have consulted surgery, very much appreciate consult, we await their input on this problem.  Patient is awake and alert at this time unsure of the date but appears to be at her baseline otherwise not complaining of pain.  ----------------------------------------- 4:28 PM on 08/24/2018 -----------------------------------------  Discussed with her daughter, patient is DNR, I discussed with surgery who have seen the patient they do not feel that she is a surgical candidate, I discussed with on-call for GI medicine, Dr. Bonna Gains, very much appreciate all the consultant input, the  consensus is that the patient should have disimpaction and enemas here and we will admitted to the hospitalist service which I have already spoken to the hospitalist about.    ____________________________________________   FINAL CLINICAL IMPRESSION(S) / ED DIAGNOSES  Final diagnoses:  None      This chart was dictated using voice recognition software.  Despite best efforts to proofread,  errors can occur which can change meaning.      Schuyler Amor, MD 08/24/18 1336    Schuyler Amor, MD 08/24/18 1545    Schuyler Amor, MD 08/24/18 [phone]

## 2018-08-24 NOTE — Consult Note (Signed)
Vonda Antigua, MD [address], [place], [place], [place], [address] [address] [place], [place], [place], [address] Phone: [phone]  Fax: [phone]  Consultation  Referring Provider:     Dr. Posey Pronto Primary Care Physician:  Gala Romney, MD Reason for Consultation:    Constipation Primary gastroenterologist: [facility]  Date of Admission:  08/24/2018 Date of Consultation:  08/24/2018         HPI:   Diana Hawkins is a 85 y.o. female presents due to nausea and vomiting and not moving her bowels for 6 days.  States has had previous history of constipation requiring enema use at an ER visit before.  States has been passing gas over the last 6 days and did pass gas today.  Had vomiting earlier today and did not consist of blood.  No blood in stool or melena.  Reports history of IBS and states sometimes has diarrhea and sometimes constipation and uses Imodium as needed as at home.  Has history of colon cancer and according to office visit by Dr. Percell Boston team, patient has history of stage III ascending colon adenocarcinoma diagnosed in December 2009 status post partial colectomy in 2010 with subsequent local variant x6 months.  Followed by [facility] oncology and has remained cancer free.  Their note also states that [facility] and recall having stool test and a colonoscopy but unaware of results.  We do not have these procedure report as well.  Past Medical History:  Diagnosis Date  . Anemia 07/2008   extreme anemia  . Depressive disorder   . Hepatitis A 01/1976  . Hypertension   . Irregular heartbeat 02/2001   Irregular heartbeat checked  . Stroke [facility])     Past Surgical History:  Procedure Laterality Date  . ABDOMINAL HYSTERECTOMY  01/1968   Complete   . BREAST SURGERY Right 11/1966   Benign tumor right breast removed  . CATARACT EXTRACTION W/ INTRAOCULAR LENS IMPLANT Bilateral 04/03 L eye; 06/03 R eye  . COLON  SURGERY  1/10   Stage 3 Cancer  . IR GENERIC HISTORICAL  09/22/2014   IR ANGIO INTRA EXTRACRAN SEL COM CAROTID INNOMINATE BILAT MOD SED 09/22/2014 [name], MD [facility]  . IR GENERIC HISTORICAL  09/22/2014   IR ANGIO VERTEBRAL SEL VERTEBRAL UNI R MOD SED 09/22/2014 [name], MD [facility]  . OOPHORECTOMY Right 08/1964   Right Ovary Removed, benign tumor  . PANCREATIC CYST DRAINAGE  03/2012   Benign    Prior to Admission medications   Medication Sig Start Date End Date Taking? Authorizing Provider  acetaminophen (TYLENOL) 650 MG CR tablet Take 650 mg by mouth at bedtime.   Yes [provider]  Biotin 300 MCG TABS Take 300 mcg by mouth every evening.    Yes [provider]  fluticasone (FLONASE) 50 MCG/ACT nasal spray Place 1 spray into both nostrils 2 (two) times daily.   Yes [provider]  furosemide (LASIX) 20 MG tablet Take 10 mg by mouth daily.   Yes [provider]  gabapentin (NEURONTIN) 100 MG capsule Take 1 capsule (100 mg total) by mouth 3 (three) times daily. Patient taking differently: Take 300 mg by mouth at bedtime.  04/29/15  Yes Mesner, Corene Cornea, MD  Lactase (LACTAID FAST ACT) 9000 units TABS Take 18,000 Units by mouth 2 (two) times daily.   Yes [provider]  loperamide (IMODIUM A-D) 2 MG tablet Take 2 mg  by mouth 4 (four) times daily as needed for diarrhea or loose stools.   Yes [provider]  mirtazapine (REMERON) 15 MG tablet Take 15 mg by mouth at bedtime.   Yes [provider]  Multiple Vitamin (DAILY VITE) TABS Take 1 tablet by mouth daily.   Yes [provider]  Loma Boston (OYSTER CALCIUM) 500 MG TABS tablet Take 1,000 mg of elemental calcium by mouth daily.   Yes [provider]  potassium chloride (K-DUR,KLOR-CON) 10 MEQ tablet Take 10 mEq by mouth daily.   Yes [provider]  Probiotic Product (RISA-BID PROBIOTIC) TABS Take 1 tablet by mouth every  evening.   Yes [provider]  solifenacin (VESICARE) 5 MG tablet Take 5 mg by mouth daily.   Yes [provider]  carboxymethylcellulose (REFRESH PLUS) 0.5 % SOLN Place 1 drop into both eyes 3 (three) times daily as needed (for dry eyes).    [provider]  colestipol (COLESTID) 5 g packet Take 5 g by mouth every 12 (twelve) hours as needed (for diarrhea).    [provider]  ondansetron (ZOFRAN-ODT) 8 MG disintegrating tablet Take 8 mg by mouth 3 (three) times daily as needed for nausea or vomiting.    [provider]  polyethylene glycol powder (GLYCOLAX/MIRALAX) powder Take 17 g by mouth daily. 07/06/18 07/06/19  Carrie Mew, MD  Psyllium (METAMUCIL) 28.3 % POWD Take 20 mLs by mouth as needed (for constipation).    [provider]  senna-docusate (SENOKOT-S) 8.6-50 MG tablet Take 2 tablets by mouth 2 (two) times daily. 07/06/18   Carrie Mew, MD  simethicone (MYLICON) 80 MG chewable tablet Chew 80 mg by mouth every 6 (six) hours as needed for flatulence.    [provider]    History reviewed. No pertinent family history.   Social History   Tobacco Use  . Smoking status: Never Smoker  . Smokeless tobacco: Never Used  Substance Use Topics  . Alcohol use: No    Alcohol/week: 0.0 standard drinks  . Drug use: No    Allergies as of 08/24/2018 - Review Complete 08/24/2018  Allergen Reaction Noted  . Codeine Nausea And Vomiting and Other (See Comments) 09/22/2014  . Reglan [metoclopramide] Other (See Comments) 09/22/2014    Review of Systems:    All systems reviewed and negative except where noted in HPI.   Physical Exam:  Vital signs in last 24 hours: Vitals:   08/24/18 1430 08/24/18 1500 08/24/18 1515 08/24/18 1530  BP: (!) 160/74 (!) 157/72  137/85  Pulse:   (!) 115 (!) 115  Resp: 19 14 17 15   Temp:      TempSrc:      SpO2:   99% 100%  Weight:      Height:         General:   Pleasant, cooperative  in NAD Head:  Normocephalic and atraumatic. Eyes:   No icterus.   Conjunctiva pink. PERRLA. Ears:  Normal auditory acuity. Neck:  Supple; no masses or thyroidomegaly Lungs: Respirations even and unlabored. Lungs clear to auscultation bilaterally.   No wheezes, crackles, or rhonchi.  Abdomen:  Soft, mildly distended, nontender.  No signs of acute abdomen.  Normal bowel sounds. No appreciable masses or hepatomegaly.  No rebound or guarding.  Neurologic:  Alert and oriented x3;  grossly normal neurologically. Skin:  Intact without significant lesions or rashes. Cervical Nodes:  No significant cervical adenopathy. Psych:  Alert and cooperative. Normal affect.  LAB RESULTS:  Recent Labs    08/24/18 1346  WBC 25.2*  HGB 13.7  HCT 42.7  PLT 313   BMET Recent Labs    08/24/18 1346  NA 142  K 3.6  CL 101  CO2 30  GLUCOSE 199*  BUN 33*  CREATININE 1.24*  CALCIUM 9.7   LFT Recent Labs    08/24/18 1346  PROT 7.5  ALBUMIN 4.0  AST 26  ALT 16  ALKPHOS 81  BILITOT 0.9   PT/INR No results for input(s): LABPROT, INR in the last 72 hours.  STUDIES: Ct Abdomen Pelvis W Contrast  Result Date: 08/24/2018 CLINICAL DATA:  Constipation.  Nausea. EXAM: CT ABDOMEN AND PELVIS WITH CONTRAST TECHNIQUE: Multidetector CT imaging of the abdomen and pelvis was performed using the standard protocol following bolus administration of intravenous contrast. CONTRAST:  58mL OMNIPAQUE IOHEXOL 300 MG/ML  SOLN COMPARISON:  02/16/2018 FINDINGS: Lower chest: No acute abnormality. Chronic elevation of the left hemidiaphragm. Hepatobiliary: No focal liver abnormality is seen. No gallstones, gallbladder wall thickening, or biliary dilatation. Pancreas: Limited visualization due to compression, grossly unremarkable. Spleen: Normal in size without focal abnormality. Adrenals/Urinary Tract: Adrenal glands are unremarkable. Kidneys are without renal calculi, solid lesion, or hydronephrosis. 17 mm right renal cyst.  Bladder is unremarkable. Stomach/Bowel: No evidence of small-bowel obstruction. Postsurgical changes along the cecum. Markedly dilated filled with formed stool rectum and distal sigmoid colon. The remainder of the colon is nearly decompressed. No evidence of small-bowel obstruction. Vascular/Lymphatic: Aortic atherosclerosis. No enlarged abdominal or pelvic lymph nodes. Reproductive: Status post hysterectomy. No adnexal masses. Other: No abdominal wall hernia or abnormality. No abdominopelvic ascites. Musculoskeletal: Spondylosis of the lumbosacral spine. IMPRESSION: Severe dilation of the rectum and distal sigmoid colon with large amount of formed stool. Findings consistent with rectal impaction. No evidence of volvulus or small bowel obstruction, accounting for compressive sequela to the GI tract. No acute findings within the solid abdominal organs. Electronically Signed   By: Fidela Salisbury M.D.   On: 08/24/2018 15:18      Impression / Plan:   Diana Hawkins is a 85 y.o. y/o female with constipation  No signs of obstipation.  Patient currently clinically stable with no abdominal pain, no signs of acute abdomen. Patient will need surgery consult due to CT findings showing severe colon distention and elevated lactate concerning for ischemia  Would recommend treatment with per rectal medications to relieve fecal impaction. Please place nursing order for warm 250 cc tap water enema X 3-4 today until moderate to large sized bowel movement  The reason for the amount of water enema is to allow the water to infiltrate the colon where the area of impaction is and disimpact that way, a small amount of enemas may not work well  However, can alternate between tap water enema, Fleet enema, soapsuds enema.  If patient has adequate bowel movement today, start oral MiraLAX twice daily. If no bowel movement today with above enema, repeat enemas tomorrow.    If patient has changes in abdominal exam,  hemodynamic status, or develops acute abdomen, please page surgery emergently for evaluation for surgery.  Recheck lactate today after IV fluid hydration to ensure it is improving.  If not, patient will need emergent surgery evaluation.  We will continue to follow  On an outpatient basis, she should be followed closely by her primary GI, Dr. Vira Agar, and previous colonoscopy report will need to be reviewed to see if her colonoscopy needs to be repeated to ensure no reasons  for obstruction given her severe constipation.  Her colonoscopy was in 2017 which is very recent therefore, putting her through a colonoscopy urgently on this admission is not needed.  However, her colonoscopy record will need to be obtained by her primary GI and reviewed to see if she needs a colonoscopy within this year.  Please page on-call GI with any questions or changes in clinical status.  Thank you for involving me in the care of this patient.      LOS: 0 days   Virgel Manifold, MD  08/24/2018, 5:25 PM

## 2018-08-24 NOTE — Progress Notes (Signed)
Advanced care plan.  Purpose of the Encounter: CODE STATUS  Parties in Attendance: Patient  Patient's Decision Capacity: Intact  Subjective/Patient's story: Diana Hawkins  is a 85 y.o. female with a known history of depressive disorder, previous stroke who currently resides at [facility] assisted living brought in with abdominal pain patient noticed to have severe constipation   Objective/Medical story  I discussed with the patient regarding her desires for cardiac and pulmonary resuscitation she voices understanding she states that she would like to be a full code  Goals of care determination:  Full code   CODE STATUS: Full code   Time spent discussing advanced care planning: 16 minutes

## 2018-08-24 NOTE — ED Notes (Signed)
Dr Burlene Arnt notified in person LA 3.7. no new orders.

## 2018-08-24 NOTE — ED Notes (Signed)
This RN called lab for status on lab work; spoke with Clinical biochemist. Per lab tech, blood work is in process as of now.

## 2018-08-24 NOTE — ED Triage Notes (Signed)
Pt brought to ED from [place] via [facility]. Per EMS staff at [facility] reported pt having N/V; a heart rate of 130. [facility] staff administered 8 mg of Zofran at 1030am today for N/V. Per EMS BP 176/106, HR 130s; CBG 248. NAD noted at this time.

## 2018-08-24 NOTE — Consult Note (Addendum)
Reason for Consult:abdominal pain Referring Physician: Dustin Flock (hospitalist)  Diana Hawkins is an 85 y.o. female.  HPI: She has a history of stroke and colon cancer status post partial colectomy.  She currently resides in an assisted living facility.  She was brought to the emergency department today due to abdominal pain.  CT scan revealed massive amounts of stool in the sigmoid colon and rectum.  There was no evidence of ischemic bowel seen.  General surgery has been consulted to evaluate her abdominal pain.  At the time of my interview and examination, the patient is denying abdominal pain.  She does have some degree of dementia and so she may not be entirely reliable in this regard.  She does report passing gas and stool today.  She denies nausea or vomiting.  Past Medical History:  Diagnosis Date  . Anemia 07/2008   extreme anemia  . Depressive disorder   . Hepatitis A 01/1976  . Hypertension   . Irregular heartbeat 02/2001   Irregular heartbeat checked  . Stroke [facility])     Past Surgical History:  Procedure Laterality Date  . ABDOMINAL HYSTERECTOMY  01/1968   Complete   . BREAST SURGERY Right 11/1966   Benign tumor right breast removed  . CATARACT EXTRACTION W/ INTRAOCULAR LENS IMPLANT Bilateral 04/03 L eye; 06/03 R eye  . COLON SURGERY  1/10   Stage 3 Cancer  . IR GENERIC HISTORICAL  09/22/2014   IR ANGIO INTRA EXTRACRAN SEL COM CAROTID INNOMINATE BILAT MOD SED 09/22/2014 [name], MD [facility]  . IR GENERIC HISTORICAL  09/22/2014   IR ANGIO VERTEBRAL SEL VERTEBRAL UNI R MOD SED 09/22/2014 [name], MD [facility]  . OOPHORECTOMY Right 08/1964   Right Ovary Removed, benign tumor  . PANCREATIC CYST DRAINAGE  03/2012   Benign    History reviewed. No pertinent family history.  Social History:  reports that she has never smoked. She has never used smokeless tobacco. She reports that she does not drink alcohol or use drugs.  Allergies:  Allergies   Allergen Reactions  . Codeine Nausea And Vomiting and Other (See Comments)    "feels like I want to climb the walls" and dizziness  . Reglan [Metoclopramide] Other (See Comments)    Stomach cramps    Medications: I have reviewed the patient's current medications.  Results for orders placed or performed during the hospital encounter of 08/24/18 (from the past 48 hour(s))  CBC with Differential     Status: Abnormal   Collection Time: 08/24/18  1:46 PM  Result Value Ref Range   WBC 25.2 (H) 4.0 - 10.5 K/uL   RBC 4.88 3.87 - 5.11 MIL/uL   Hemoglobin 13.7 12.0 - 15.0 g/dL   HCT 42.7 36.0 - 46.0 %   MCV 87.5 80.0 - 100.0 fL   MCH 28.1 26.0 - 34.0 pg   MCHC 32.1 30.0 - 36.0 g/dL   RDW 14.7 11.5 - 15.5 %   Platelets 313 150 - 400 K/uL   nRBC 0.0 0.0 - 0.2 %   Neutrophils Relative % 93 %   Neutro Abs 23.4 (H) 1.7 - 7.7 K/uL   Lymphocytes Relative 2 %   Lymphs Abs 0.4 (L) 0.7 - 4.0 K/uL   Monocytes Relative 4 %   Monocytes Absolute 1.1 (H) 0.1 - 1.0 K/uL   Eosinophils Relative 0 %   Eosinophils Absolute 0.0 0.0 - 0.5 K/uL   Basophils Relative 0 %   Basophils Absolute 0.1 0.0 -  0.1 K/uL   WBC Morphology INCREASED BANDS (>20% BANDS)     Comment: TOXIC GRANULATION   RBC Morphology MORPHOLOGY UNREMARKABLE    Smear Review Normal platelet morphology    Immature Granulocytes 1 %   Abs Immature Granulocytes 0.20 (H) 0.00 - 0.07 K/uL    Comment: Performed at [facility], [address] [place], [place] [address]  Troponin I - Once     Status: None   Collection Time: 08/24/18  1:46 PM  Result Value Ref Range   Troponin I <0.03 <0.03 ng/mL    Comment: Performed at [facility], [address] [place], [place] [address]  Comprehensive metabolic panel     Status: Abnormal   Collection Time: 08/24/18  1:46 PM  Result Value Ref Range   Sodium 142 135 - 145 mmol/L   Potassium 3.6 3.5 - 5.1 mmol/L   Chloride 101 98 - 111 mmol/L   CO2 30 22 - 32 mmol/L   Glucose, Bld  199 (H) 70 - 99 mg/dL   BUN 33 (H) 8 - 23 mg/dL   Creatinine, Ser 1.24 (H) 0.44 - 1.00 mg/dL   Calcium 9.7 8.9 - 10.3 mg/dL   Total Protein 7.5 6.5 - 8.1 g/dL   Albumin 4.0 3.5 - 5.0 g/dL   AST 26 15 - 41 U/L   ALT 16 0 - 44 U/L   Alkaline Phosphatase 81 38 - 126 U/L   Total Bilirubin 0.9 0.3 - 1.2 mg/dL   GFR calc non Af Amer 40 (L) >60 mL/min   GFR calc Af Amer 46 (L) >60 mL/min   Anion gap 11 5 - 15    Comment: Performed at [facility], [address] [place], [place] [address]  Lactic acid, plasma     Status: Abnormal   Collection Time: 08/24/18  1:46 PM  Result Value Ref Range   Lactic Acid, Venous 3.7 (HH) 0.5 - 1.9 mmol/L    Comment: CRITICAL RESULT CALLED TO, READ BACK BY AND VERIFIED WITH BILL SMITH @ 1512 08/24/18 SMA/TFK Performed at [facility], [address] [place], [place] [address]   Lipase, blood     Status: Abnormal   Collection Time: 08/24/18  1:46 PM  Result Value Ref Range   Lipase 244 (H) 11 - 51 U/L    Comment: Performed at [facility], [address] [place], [place] [address]  Urinalysis, Complete w Microscopic     Status: Abnormal   Collection Time: 08/24/18  5:45 PM  Result Value Ref Range   Color, Urine YELLOW (A) YELLOW   APPearance HAZY (A) CLEAR   Specific Gravity, Urine >1.046 (H) 1.005 - 1.030   pH 6.0 5.0 - 8.0   Glucose, UA NEGATIVE NEGATIVE mg/dL   Hgb urine dipstick MODERATE (A) NEGATIVE   Bilirubin Urine NEGATIVE NEGATIVE   Ketones, ur NEGATIVE NEGATIVE mg/dL   Protein, ur 30 (A) NEGATIVE mg/dL   Nitrite NEGATIVE NEGATIVE   Leukocytes, UA LARGE (A) NEGATIVE   RBC / HPF 21-50 0 - 5 RBC/hpf   WBC, UA >50 (H) 0 - 5 WBC/hpf   Bacteria, UA NONE SEEN NONE SEEN   Squamous Epithelial / LPF 11-20 0 - 5   Mucus PRESENT     Comment: Performed at [facility], [address], [place], [place] [address]    Ct Abdomen Pelvis W Contrast  Result Date: 08/24/2018 CLINICAL DATA:  Constipation.   Nausea. EXAM: CT ABDOMEN AND PELVIS  WITH CONTRAST TECHNIQUE: Multidetector CT imaging of the abdomen and pelvis was performed using the standard protocol following bolus administration of intravenous contrast. CONTRAST:  89mL OMNIPAQUE IOHEXOL 300 MG/ML  SOLN COMPARISON:  02/16/2018 FINDINGS: Lower chest: No acute abnormality. Chronic elevation of the left hemidiaphragm. Hepatobiliary: No focal liver abnormality is seen. No gallstones, gallbladder wall thickening, or biliary dilatation. Pancreas: Limited visualization due to compression, grossly unremarkable. Spleen: Normal in size without focal abnormality. Adrenals/Urinary Tract: Adrenal glands are unremarkable. Kidneys are without renal calculi, solid lesion, or hydronephrosis. 17 mm right renal cyst. Bladder is unremarkable. Stomach/Bowel: No evidence of small-bowel obstruction. Postsurgical changes along the cecum. Markedly dilated filled with formed stool rectum and distal sigmoid colon. The remainder of the colon is nearly decompressed. No evidence of small-bowel obstruction. Vascular/Lymphatic: Aortic atherosclerosis. No enlarged abdominal or pelvic lymph nodes. Reproductive: Status post hysterectomy. No adnexal masses. Other: No abdominal wall hernia or abnormality. No abdominopelvic ascites. Musculoskeletal: Spondylosis of the lumbosacral spine. IMPRESSION: Severe dilation of the rectum and distal sigmoid colon with large amount of formed stool. Findings consistent with rectal impaction. No evidence of volvulus or small bowel obstruction, accounting for compressive sequela to the GI tract. No acute findings within the solid abdominal organs. Electronically Signed   By: Fidela Salisbury M.D.   On: 08/24/2018 15:18    Review of Systems  Unable to perform ROS: Dementia   Blood pressure 127/67, pulse 74, temperature 98.6 F (37 C), temperature source Oral, resp. rate 17, height 5\' 5"  (1.651 m), weight 54.4 kg, SpO2 96 %. Physical Exam   Constitutional: No distress.  Cachectic.  HENT:  Head: Normocephalic and atraumatic.  Mouth/Throat: No oropharyngeal exudate.  Eyes: Right eye exhibits no discharge. Left eye exhibits no discharge. No scleral icterus.  Neck: No tracheal deviation present.  Cardiovascular: Normal rate.  Respiratory: Effort normal. No stridor.  GI: Soft. She exhibits distension. There is no abdominal tenderness. There is no rebound and no guarding.  No peritoneal signs.  Genitourinary:    Genitourinary Comments: deferred   Musculoskeletal:     Comments: Not evaluated; patient frail and in bed.  Lymphadenopathy:    She has no cervical adenopathy.  Neurological: She is alert.  Skin: Skin is warm and dry.    Assessment/Plan: This is an 85 year old woman who lives in an institutional setting.  She has dementia after a stroke as well as a history of colon surgery for malignancy.  She has had multiple emergency department visits for fecal impaction.  Although she does have an elevated lactate on her blood work, she does not have an acute abdomen and there is no evidence of ischemic bowel seen on her CT scan.  Gastroenterology has also been consulted and has recommended tapwater enemas as well as oral MiraLAX.  If tapwater enemas are not effective, sorbitol, SMOG, or milk and molasses enemas could be tried.   At this time, there is no indication for surgical intervention.  She may require manual disimpaction however this does not require a Psychologist, sport and exercise.  Surgery will sign off.  Should she develop peritonitis or other signs of an acute abdomen, please feel free to reconsult.  Fredirick Maudlin 08/24/2018, 9:03 PM

## 2018-08-24 NOTE — Progress Notes (Signed)
Anticoagulation monitoring(Lovenox):   85 yo female ordered Lovenox 40 mg Q24h  Filed Weights   08/24/18 1311  Weight: 120 lb (54.4 kg)   BMI    Lab Results  Component Value Date   CREATININE 1.24 (H) 08/24/2018   CREATININE 0.64 07/06/2018   CREATININE 1.02 (H) 02/16/2018   Estimated Creatinine Clearance: 29 mL/min (A) (by C-G formula based on SCr of 1.24 mg/dL (H)). Hemoglobin & Hematocrit     Component Value Date/Time   HGB 13.7 08/24/2018 1346   HCT 42.7 08/24/2018 1346     Per Protocol for Patient with estCrcl < 30 ml/min and BMI < 40, will transition to Lovenox 30 mg Q24h.

## 2018-08-24 NOTE — ED Notes (Signed)
Pt given 250cc tap water enema with no results.

## 2018-08-24 NOTE — H&P (Signed)
[facility] [place] NAME: Diana Hawkins    MR#:  [id number]  DATE OF BIRTH:  1932-03-06  DATE OF ADMISSION:  08/24/2018  PRIMARY CARE PHYSICIAN: Gala Romney, MD   REQUESTING/REFERRING PHYSICIAN: Schuyler Amor, MD  CHIEF COMPLAINT:   Chief Complaint  Patient presents with  . Tachycardia    HISTORY OF PRESENT ILLNESS: Diana Hawkins  is a 85 y.o. female with a known history of depressive disorder, previous stroke who currently resides at [facility] assisted living brought in with abdominal pain ongoing for the past few days.  Patient states that she has not had a bowel movement in 6 days.  Any fevers chills no chest pain or palpitations.  PAST MEDICAL HISTORY:   Past Medical History:  Diagnosis Date  . Anemia 07/2008   extreme anemia  . Depressive disorder   . Hepatitis A 01/1976  . Hypertension   . Irregular heartbeat 02/2001   Irregular heartbeat checked  . Stroke [facility])     PAST SURGICAL HISTORY:  Past Surgical History:  Procedure Laterality Date  . ABDOMINAL HYSTERECTOMY  01/1968   Complete   . BREAST SURGERY Right 11/1966   Benign tumor right breast removed  . CATARACT EXTRACTION W/ INTRAOCULAR LENS IMPLANT Bilateral 04/03 L eye; 06/03 R eye  . COLON SURGERY  1/10   Stage 3 Cancer  . IR GENERIC HISTORICAL  09/22/2014   IR ANGIO INTRA EXTRACRAN SEL COM CAROTID INNOMINATE BILAT MOD SED 09/22/2014 [name], MD [facility]  . IR GENERIC HISTORICAL  09/22/2014   IR ANGIO VERTEBRAL SEL VERTEBRAL UNI R MOD SED 09/22/2014 [name], MD [facility]  . OOPHORECTOMY Right 08/1964   Right Ovary Removed, benign tumor  . PANCREATIC CYST DRAINAGE  03/2012   Benign    SOCIAL HISTORY:  Social History   Tobacco Use  . Smoking status: Never Smoker  . Smokeless tobacco: Never Used  Substance Use Topics  . Alcohol use: No    Alcohol/week: 0.0 standard drinks    FAMILY HISTORY: History reviewed. No pertinent  family history.  DRUG ALLERGIES:  Allergies  Allergen Reactions  . Codeine Nausea And Vomiting and Other (See Comments)    "feels like I want to climb the walls" and dizziness  . Reglan [Metoclopramide] Other (See Comments)    Stomach cramps    REVIEW OF SYSTEMS:   CONSTITUTIONAL: No fever, fatigue or weakness.  EYES: No blurred or double vision.  EARS, NOSE, AND THROAT: No tinnitus or ear pain.  RESPIRATORY: No cough, shortness of breath, wheezing or hemoptysis.  CARDIOVASCULAR: No chest pain, orthopnea, edema.  GASTROINTESTINAL: Positive nausea, positive vomiting, diarrhea or positive abdominal pain.  GENITOURINARY: No dysuria, hematuria.  ENDOCRINE: No polyuria, nocturia,  HEMATOLOGY: No anemia, easy bruising or bleeding SKIN: No rash or lesion. MUSCULOSKELETAL: No joint pain or arthritis.   NEUROLOGIC: No tingling, numbness, weakness.  PSYCHIATRY: No anxiety or depression.   MEDICATIONS AT HOME:  Prior to Admission medications   Medication Sig Start Date End Date Taking? Authorizing Provider  gabapentin (NEURONTIN) 100 MG capsule Take 1 capsule (100 mg total) by mouth 3 (three) times daily. Patient taking differently: Take 300 mg by mouth at bedtime.  04/29/15  Yes Mesner, Corene Cornea, MD  acetaminophen (TYLENOL) 650 MG CR tablet Take 650 mg by mouth at bedtime.    [provider]  Biotin 300 MCG TABS Take 300 mcg by mouth every evening.     [provider]  carboxymethylcellulose (REFRESH PLUS) 0.5 % SOLN Place 1 drop into both eyes 3 (three) times daily as needed (for dry eyes).    [provider]  colestipol (COLESTID) 5 g packet Take 5 g by mouth every 12 (twelve) hours as needed (for diarrhea).    [provider]  fluticasone (FLONASE) 50 MCG/ACT nasal spray Place 1 spray into both nostrils 2 (two) times daily.    [provider]  furosemide (LASIX) 20 MG tablet Take 10 mg by mouth daily.    [provider]  Lactase  (LACTAID FAST ACT) 9000 units TABS Take 18,000 Units by mouth 2 (two) times daily.    [provider]  mirtazapine (REMERON) 15 MG tablet Take 15 mg by mouth at bedtime.    [provider]  Multiple Vitamin (DAILY VITE) TABS Take 1 tablet by mouth daily.    [provider]  ondansetron (ZOFRAN-ODT) 8 MG disintegrating tablet Take 8 mg by mouth 3 (three) times daily as needed for nausea or vomiting.    [provider]  Loma [place] (OYSTER CALCIUM) 500 MG TABS tablet Take 1,000 mg of elemental calcium by mouth daily.    [provider]  polyethylene glycol powder (GLYCOLAX/MIRALAX) powder Take 17 g by mouth daily. 07/06/18 07/06/19  Carrie Mew, MD  potassium chloride (K-DUR,KLOR-CON) 10 MEQ tablet Take 10 mEq by mouth daily.    [provider]  Probiotic Product (RISA-BID PROBIOTIC) TABS Take 1 tablet by mouth every evening.    [provider]  Psyllium (METAMUCIL) 28.3 % POWD Take 20 mLs by mouth as needed (for constipation).    [provider]  senna-docusate (SENOKOT-S) 8.6-50 MG tablet Take 2 tablets by mouth 2 (two) times daily. 07/06/18   Carrie Mew, MD  simethicone (MYLICON) 80 MG chewable tablet Chew 80 mg by mouth every 6 (six) hours as needed for flatulence.    [provider]  solifenacin (VESICARE) 5 MG tablet Take 5 mg by mouth daily.    [provider]      PHYSICAL EXAMINATION:   VITAL SIGNS: Blood pressure 137/85, pulse (!) 115, temperature 98.6 F (37 C), temperature source Oral, resp. rate 15, height 5\' 5"  (1.651 m), weight 54.4 kg, SpO2 100 %.  GENERAL:  85 y.o.-year-old patient lying in the bed with no acute distress.  EYES: Pupils equal, round, reactive to light and accommodation. No scleral icterus. Extraocular muscles intact.  HEENT: Head atraumatic, normocephalic. Oropharynx and nasopharynx clear.  NECK:  Supple, no jugular venous distention. No thyroid enlargement, no  tenderness.  LUNGS: Normal breath sounds bilaterally, no wheezing, rales,rhonchi or crepitation. No use of accessory muscles of respiration.  CARDIOVASCULAR: S1, S2 normal. No murmurs, rubs, or gallops.  ABDOMEN: Soft, distended some epigastric tenderness no guarding EXTREMITIES: No pedal edema, cyanosis, or clubbing.  NEUROLOGIC: Cranial nerves II through XII are intact. Muscle strength 5/5 in all extremities. Sensation intact. Gait not checked.  PSYCHIATRIC: The patient is alert and oriented x 3.  SKIN: No obvious rash, lesion, or ulcer.   LABORATORY PANEL:   CBC Recent Labs  Lab 08/24/18 1346  WBC 25.2*  HGB 13.7  HCT 42.7  PLT 313  MCV 87.5  MCH 28.1  MCHC 32.1  RDW 14.7  LYMPHSABS 0.4*  MONOABS 1.1*  EOSABS 0.0  BASOSABS 0.1   ------------------------------------------------------------------------------------------------------------------  Chemistries  Recent Labs  Lab 08/24/18 1346  NA 142  K 3.6  CL 101  CO2 30  GLUCOSE  199*  BUN 33*  CREATININE 1.24*  CALCIUM 9.7  AST 26  ALT 16  ALKPHOS 81  BILITOT 0.9   ------------------------------------------------------------------------------------------------------------------ estimated creatinine clearance is 29 mL/min (A) (by C-G formula based on SCr of 1.24 mg/dL (H)). ------------------------------------------------------------------------------------------------------------------ No results for input(s): TSH, T4TOTAL, T3FREE, THYROIDAB in the last 72 hours.  Invalid input(s): FREET3   Coagulation profile No results for input(s): INR, PROTIME in the last 168 hours. ------------------------------------------------------------------------------------------------------------------- No results for input(s): DDIMER in the last 72 hours. -------------------------------------------------------------------------------------------------------------------  Cardiac Enzymes Recent Labs  Lab 08/24/18 1346   TROPONINI <0.03   ------------------------------------------------------------------------------------------------------------------ Invalid input(s): POCBNP  ---------------------------------------------------------------------------------------------------------------  Urinalysis    Component Value Date/Time   COLORURINE YELLOW 04/29/2015 1215   APPEARANCEUR CLEAR 04/29/2015 1215   LABSPEC 1.003 (L) 04/29/2015 1215   PHURINE 7.0 04/29/2015 1215   GLUCOSEU NEGATIVE 04/29/2015 1215   HGBUR NEGATIVE 04/29/2015 1215   BILIRUBINUR NEGATIVE 04/29/2015 1215   KETONESUR NEGATIVE 04/29/2015 1215   PROTEINUR NEGATIVE 04/29/2015 1215   UROBILINOGEN 0.2 04/29/2015 1215   NITRITE NEGATIVE 04/29/2015 1215   LEUKOCYTESUR NEGATIVE 04/29/2015 1215     RADIOLOGY: Ct Abdomen Pelvis W Contrast  Result Date: 08/24/2018 CLINICAL DATA:  Constipation.  Nausea. EXAM: CT ABDOMEN AND PELVIS WITH CONTRAST TECHNIQUE: Multidetector CT imaging of the abdomen and pelvis was performed using the standard protocol following bolus administration of intravenous contrast. CONTRAST:  48mL OMNIPAQUE IOHEXOL 300 MG/ML  SOLN COMPARISON:  02/16/2018 FINDINGS: Lower chest: No acute abnormality. Chronic elevation of the left hemidiaphragm. Hepatobiliary: No focal liver abnormality is seen. No gallstones, gallbladder wall thickening, or biliary dilatation. Pancreas: Limited visualization due to compression, grossly unremarkable. Spleen: Normal in size without focal abnormality. Adrenals/Urinary Tract: Adrenal glands are unremarkable. Kidneys are without renal calculi, solid lesion, or hydronephrosis. 17 mm right renal cyst. Bladder is unremarkable. Stomach/Bowel: No evidence of small-bowel obstruction. Postsurgical changes along the cecum. Markedly dilated filled with formed stool rectum and distal sigmoid colon. The remainder of the colon is nearly decompressed. No evidence of small-bowel obstruction. Vascular/Lymphatic: Aortic  atherosclerosis. No enlarged abdominal or pelvic lymph nodes. Reproductive: Status post hysterectomy. No adnexal masses. Other: No abdominal wall hernia or abnormality. No abdominopelvic ascites. Musculoskeletal: Spondylosis of the lumbosacral spine. IMPRESSION: Severe dilation of the rectum and distal sigmoid colon with large amount of formed stool. Findings consistent with rectal impaction. No evidence of volvulus or small bowel obstruction, accounting for compressive sequela to the GI tract. No acute findings within the solid abdominal organs. Electronically Signed   By: Fidela Salisbury M.D.   On: 08/24/2018 15:18    EKG: Orders placed or performed during the hospital encounter of 08/24/18  . EKG 12-Lead  . EKG 12-Lead  . ED EKG  . ED EKG    IMPRESSION AND PLAN: Patient is a 85 year old being admitted with abdominal pain  1.  Abdominal pain due to severe constipation ED physician has spoken to the surgeon as well as GI MD We will give patient MiraLAX prep We will give her Fleet enema Continue senna and Colace  2.  Elevated leukocytosis suspect due to severe constipation patient is afebrile lactic acid elevation likely due to #1 has received IV antibiotics in the ED I will hold off on any on IV antibiotics  3.  Previous history of CVA I will place patient on aspirin  4.  Elevated blood sugar we will do hemoglobin A1c   5.  Miscellaneous Lovenox for DVT prophylax  cords are reviewed and case discussed  with ED provider. Management plans discussed with the patient, family and they are in agreement.  CODE STATUS: Code Status History    Date Active Date Inactive Code Status Order ID Comments User Context   09/26/2014 1918 10/05/2014 1736 DNR [id number]  Cristi Loron, RN Inpatient   09/26/2014 1814 09/26/2014 1918 Full Code [id number]  Cathlyn Parsons, PA-C Inpatient   09/26/2014 1814 09/26/2014 1814 Full Code [id number]  Cathlyn Parsons, PA-C Inpatient   09/22/2014 1046  09/26/2014 1814 Full Code [id number]  [name], MD Inpatient    Questions for Most Recent Historical Code Status (Order [id number])    Question Answer Comment   In the event of cardiac or respiratory ARREST Do not call a "code blue"    In the event of cardiac or respiratory ARREST Do not perform Intubation, CPR, defibrillation or ACLS    In the event of cardiac or respiratory ARREST Use medication by any route, position, wound care, and other measures to relive pain and suffering. May use oxygen, suction and manual treatment of airway obstruction as needed for comfort.        TOTAL TIME TAKING CARE OF THIS PATIENT: 35minutes.    Dustin Flock M.D on 08/24/2018 at 4:29 PM  Between 7am to 6pm - Pager - [phone]  After 6pm go to [email or web address] - password [email or web address] [facility]  [phone]  CC: Primary care physician; Gala Romney, MD

## 2018-08-25 DIAGNOSIS — K5900 Constipation, unspecified: Secondary | ICD-10-CM | POA: Diagnosis not present

## 2018-08-25 LAB — LACTIC ACID, PLASMA: Lactic Acid, Venous: 1 mmol/L (ref 0.5–1.9)

## 2018-08-25 LAB — CBC
HCT: 37.8 % (ref 36.0–46.0)
Hemoglobin: 12 g/dL (ref 12.0–15.0)
MCH: 28.2 pg (ref 26.0–34.0)
MCHC: 31.7 g/dL (ref 30.0–36.0)
MCV: 88.9 fL (ref 80.0–100.0)
PLATELETS: 261 10*3/uL (ref 150–400)
RBC: 4.25 MIL/uL (ref 3.87–5.11)
RDW: 14.8 % (ref 11.5–15.5)
WBC: 21.7 10*3/uL — ABNORMAL HIGH (ref 4.0–10.5)
nRBC: 0 % (ref 0.0–0.2)

## 2018-08-25 LAB — MRSA PCR SCREENING: MRSA BY PCR: NEGATIVE

## 2018-08-25 MED ORDER — DOCUSATE SODIUM 100 MG PO CAPS: 100.0000 mg | ORAL_CAPSULE | Freq: Two times a day (BID) | ORAL | Status: DC

## 2018-08-25 NOTE — Clinical Social Work Note (Signed)
CSW consulted due to patient being a resident at [facility]. CSW contacted [name] at [facility] but had to leave a message. CSW attempted to reach patient's daughter: Ms. Patsey Berthold but also had to leave a message.  Shela Leff MSW,LCSW [phone]

## 2018-08-25 NOTE — Care Management Obs Status (Signed)
[facility] NOTIFICATION   Patient Details  Name: Diana Hawkins MRN: [id number] Date of Birth: 13-Sep-1931   Medicare Observation Status Notification Given:  No(Attempted to call patient's daughter. Phone rang multiple times, but was not able to leave a voicemail.  Will try again later )    Beverly Sessions, RN 08/25/2018, 1:53 PM

## 2018-08-25 NOTE — Progress Notes (Addendum)
Vonda Antigua, MD [address] [place], [place], [place], [address] [address], [place], [place], [place], [address] Phone: [phone]  Fax: [phone]   Subjective: Pt states she had bowel movements with the enemas given.  No nausea or vomiting.   Objective: Exam: Vital signs in last 24 hours: Vitals:   08/24/18 1700 08/24/18 1730 08/24/18 1852 08/25/18 0449  BP: (!) 132/59 (!) 137/59 127/67 129/62  Pulse: 88 87 74 82  Resp: 18 17  20   Temp:   98.6 F (37 C) 99.5 F (37.5 C)  TempSrc:   Oral Oral  SpO2: 100% 100% 96% 96%  Weight:      Height:       Weight change:   Intake/Output Summary (Last 24 hours) at 08/25/2018 1251 Last data filed at 08/25/2018 1021 Gross per 24 hour  Intake 1296.29 ml  Output -  Net 1296.29 ml    General: No acute distress, AAO x3 Abd: Soft, NT, mildly distended, No HSM Skin: Warm, no rashes Neck: Supple, Trachea midline   Lab Results: Lab Results  Component Value Date   WBC 25.2 (H) 08/24/2018   HGB 13.7 08/24/2018   HCT 42.7 08/24/2018   MCV 87.5 08/24/2018   PLT 313 08/24/2018   Micro Results: Recent Results (from the past 240 hour(s))  MRSA PCR Screening     Status: None   Collection Time: 08/24/18 11:53 PM  Result Value Ref Range Status   MRSA by PCR NEGATIVE NEGATIVE Final    Comment:        The GeneXpert MRSA Assay (FDA approved for NASAL specimens only), is one component of a comprehensive MRSA colonization surveillance program. It is not intended to diagnose MRSA infection nor to guide or monitor treatment for MRSA infections. Performed at [facility], [address], [place], [place] [address]    Studies/Results: Ct Abdomen Pelvis W Contrast  Result Date: 08/24/2018 CLINICAL DATA:  Constipation.  Nausea. EXAM: CT ABDOMEN AND PELVIS WITH CONTRAST TECHNIQUE: Multidetector CT imaging of the abdomen and pelvis was performed using the standard protocol following bolus administration of  intravenous contrast. CONTRAST:  24mL OMNIPAQUE IOHEXOL 300 MG/ML  SOLN COMPARISON:  02/16/2018 FINDINGS: Lower chest: No acute abnormality. Chronic elevation of the left hemidiaphragm. Hepatobiliary: No focal liver abnormality is seen. No gallstones, gallbladder wall thickening, or biliary dilatation. Pancreas: Limited visualization due to compression, grossly unremarkable. Spleen: Normal in size without focal abnormality. Adrenals/Urinary Tract: Adrenal glands are unremarkable. Kidneys are without renal calculi, solid lesion, or hydronephrosis. 17 mm right renal cyst. Bladder is unremarkable. Stomach/Bowel: No evidence of small-bowel obstruction. Postsurgical changes along the cecum. Markedly dilated filled with formed stool rectum and distal sigmoid colon. The remainder of the colon is nearly decompressed. No evidence of small-bowel obstruction. Vascular/Lymphatic: Aortic atherosclerosis. No enlarged abdominal or pelvic lymph nodes. Reproductive: Status post hysterectomy. No adnexal masses. Other: No abdominal wall hernia or abnormality. No abdominopelvic ascites. Musculoskeletal: Spondylosis of the lumbosacral spine. IMPRESSION: Severe dilation of the rectum and distal sigmoid colon with large amount of formed stool. Findings consistent with rectal impaction. No evidence of volvulus or small bowel obstruction, accounting for compressive sequela to the GI tract. No acute findings within the solid abdominal organs. Electronically Signed   By: Fidela Salisbury M.D.   On: 08/24/2018 15:18   Medications:  Scheduled Meds: . aspirin  81 mg Oral Daily  . darifenacin  7.5 mg Oral Daily  . docusate sodium  200 mg Oral BID  .  enoxaparin (LOVENOX) injection  30 mg Subcutaneous Q24H  . fluticasone  1 spray Each Nare BID  . furosemide  10 mg Oral Daily  . gabapentin  300 mg Oral QHS  . lactase  18,000 Units Oral BID  . mirtazapine  15 mg Oral QHS  . polyethylene glycol powder  1 Container Oral Once  .  senna-docusate  2 tablet Oral BID   Continuous Infusions: . sodium chloride 75 mL/hr at 08/25/18 1021   PRN Meds:.acetaminophen **OR** acetaminophen, colestipol, ondansetron **OR** ondansetron (ZOFRAN) IV   Assessment: Active Problems:   Abdominal pain    Plan: Nursing staff reports 3 bowel movements yesterday, small, moderate and large However, abdomen is mildly distended today as well Would recommend primary team to obtain abdominal x-ray to compare it from imaging yesterday If still shows large amount of stool, would recommend 250 cc tap water enema x2 or 3 today until moderate to large sized bowel movement  Start oral MiraLAX 34 g daily in the morning   LOS: 0 days   Vonda Antigua, MD 08/25/2018, 12:51 PM

## 2018-08-25 NOTE — Progress Notes (Signed)
[place] at [place] NAME: Diana Hawkins    MR#:  [id number]  DATE OF BIRTH:  1931/12/18  SUBJECTIVE: Admitted for fecal impaction, received laxatives, constipation improved.  No abdominal pain.  Some baseline dementia and slow to respond.  CHIEF COMPLAINT:   Chief Complaint  Patient presents with  . Tachycardia    REVIEW OF SYSTEMS:    Review of Systems  Constitutional: Negative for chills and fever.  HENT: Negative for hearing loss.   Eyes: Negative for blurred vision, double vision and photophobia.  Respiratory: Negative for cough, hemoptysis and shortness of breath.   Cardiovascular: Negative for palpitations, orthopnea and leg swelling.  Gastrointestinal: Negative for abdominal pain, diarrhea and vomiting.  Genitourinary: Negative for dysuria and urgency.  Musculoskeletal: Negative for myalgias and neck pain.  Skin: Negative for rash.  Neurological: Negative for dizziness, focal weakness, seizures, weakness and headaches.  Psychiatric/Behavioral: The patient does not have insomnia.     Nutrition:  Tolerating Diet: Tolerating PT:      DRUG ALLERGIES:   Allergies  Allergen Reactions  . Codeine Nausea And Vomiting and Other (See Comments)    "feels like I want to climb the walls" and dizziness  . Reglan [Metoclopramide] Other (See Comments)    Stomach cramps    VITALS:  Blood pressure 129/62, pulse 82, temperature 99.5 F (37.5 C), temperature source Oral, resp. rate 20, height 5\' 5"  (1.651 m), weight 54.4 kg, SpO2 96 %.  PHYSICAL EXAMINATION:   Physical Exam  GENERAL:  85 y.o.-year-old patient lying in the bed with no acute distress.  EYES: Pupils equal, round, reactive to light . No scleral icterus. Extraocular muscles intact and has dementia.  HEENT: Head atraumatic, normocephalic. Oropharynx and nasopharynx clear.  NECK:  Supple, no jugular venous distention. No thyroid enlargement, no tenderness.  LUNGS:  Normal breath sounds bilaterally, no wheezing, rales,rhonchi or crepitation. No use of accessory muscles of respiration.  CARDIOVASCULAR: S1, S2 normal. No murmurs, rubs, or gallops.  ABDOMEN: Soft, nontender, nondistended. Bowel sounds present. No organomegaly or mass.  EXTREMITIES: No pedal edema, cyanosis, or clubbing.  NEUROLOGIC: no gross neurological deficit. PSYCHIATRIC: The patient is alert and oriented x 3.  SKIN: No obvious rash, lesion, or ulcer.    LABORATORY PANEL:   CBC Recent Labs  Lab 08/24/18 1346  WBC 25.2*  HGB 13.7  HCT 42.7  PLT 313   ------------------------------------------------------------------------------------------------------------------  Chemistries  Recent Labs  Lab 08/24/18 1346  NA 142  K 3.6  CL 101  CO2 30  GLUCOSE 199*  BUN 33*  CREATININE 1.24*  CALCIUM 9.7  AST 26  ALT 16  ALKPHOS 81  BILITOT 0.9   ------------------------------------------------------------------------------------------------------------------  Cardiac Enzymes Recent Labs  Lab 08/24/18 1346  TROPONINI <0.03   ------------------------------------------------------------------------------------------------------------------  RADIOLOGY:  Ct Abdomen Pelvis W Contrast  Result Date: 08/24/2018 CLINICAL DATA:  Constipation.  Nausea. EXAM: CT ABDOMEN AND PELVIS WITH CONTRAST TECHNIQUE: Multidetector CT imaging of the abdomen and pelvis was performed using the standard protocol following bolus administration of intravenous contrast. CONTRAST:  87mL OMNIPAQUE IOHEXOL 300 MG/ML  SOLN COMPARISON:  02/16/2018 FINDINGS: Lower chest: No acute abnormality. Chronic elevation of the left hemidiaphragm. Hepatobiliary: No focal liver abnormality is seen. No gallstones, gallbladder wall thickening, or biliary dilatation. Pancreas: Limited visualization due to compression, grossly unremarkable. Spleen: Normal in size without focal abnormality. Adrenals/Urinary Tract: Adrenal  glands are unremarkable. Kidneys are without renal calculi, solid lesion, or hydronephrosis. 17 mm  right renal cyst. Bladder is unremarkable. Stomach/Bowel: No evidence of small-bowel obstruction. Postsurgical changes along the cecum. Markedly dilated filled with formed stool rectum and distal sigmoid colon. The remainder of the colon is nearly decompressed. No evidence of small-bowel obstruction. Vascular/Lymphatic: Aortic atherosclerosis. No enlarged abdominal or pelvic lymph nodes. Reproductive: Status post hysterectomy. No adnexal masses. Other: No abdominal wall hernia or abnormality. No abdominopelvic ascites. Musculoskeletal: Spondylosis of the lumbosacral spine. IMPRESSION: Severe dilation of the rectum and distal sigmoid colon with large amount of formed stool. Findings consistent with rectal impaction. No evidence of volvulus or small bowel obstruction, accounting for compressive sequela to the GI tract. No acute findings within the solid abdominal organs. Electronically Signed   By: Fidela Salisbury M.D.   On: 08/24/2018 15:18     ASSESSMENT AND PLAN:   Active Problems:   Abdominal pain   *85 year old female patient with history of colon cancer, partial colectomy comes from assisted living because of abdominal pain and found to have fecal impaction with severe constipation.  CT abdomen showed a massive amount of stool in sigmoid colon, rectum. 1.  Abdominal pain secondary to constipation, received a Fleet enema, MiraLAX prep, constipation resolved, had a BM yesterday.  Watch today, likely discharge tomorrow back to [facility].  #2 leukocytosis likely reactive, check CBC again today. #3. elevated lactic acid, received a dose of Zosyn, elevated lactic acid likely due to abdominal pain, constipation rather than infection. 4 history of IBS, has constipation alternating with diarrhea.  History of colon cancer status post surgery. All the records are reviewed and case discussed with Care  Management/Social Workerr. Management plans discussed with the patient, family and they are in agreement.  CODE STATUS: Full code  TOTAL TIME TAKING CARE OF THIS PATIENT: 35 minutes.   We will discharge tomorrow.  Epifanio Lesches M.D on 08/25/2018 at 12:23 PM  Between 7am to 6pm - Pager - [phone]  After 6pm go to [email or web address] - password [email or web address] [facility]  [facility]  Office  [phone]  CC: Primary care physician; Gala Romney, MD

## 2018-08-26 ENCOUNTER — Observation Stay: Payer: Medicare Other

## 2018-08-26 DIAGNOSIS — R000 Tachycardia, unspecified: Secondary | ICD-10-CM | POA: Diagnosis present

## 2018-08-26 DIAGNOSIS — K5641 Fecal impaction: Secondary | ICD-10-CM | POA: Diagnosis present

## 2018-08-26 DIAGNOSIS — Z888 Allergy status to other drugs, medicaments and biological substances status: Secondary | ICD-10-CM | POA: Diagnosis not present

## 2018-08-26 DIAGNOSIS — I10 Essential (primary) hypertension: Secondary | ICD-10-CM | POA: Diagnosis present

## 2018-08-26 DIAGNOSIS — Z885 Allergy status to narcotic agent status: Secondary | ICD-10-CM | POA: Diagnosis not present

## 2018-08-26 DIAGNOSIS — E876 Hypokalemia: Secondary | ICD-10-CM | POA: Diagnosis not present

## 2018-08-26 DIAGNOSIS — F0390 Unspecified dementia without behavioral disturbance: Secondary | ICD-10-CM | POA: Diagnosis present

## 2018-08-26 DIAGNOSIS — R739 Hyperglycemia, unspecified: Secondary | ICD-10-CM | POA: Diagnosis present

## 2018-08-26 DIAGNOSIS — R12 Heartburn: Secondary | ICD-10-CM | POA: Diagnosis not present

## 2018-08-26 DIAGNOSIS — E872 Acidosis: Secondary | ICD-10-CM | POA: Diagnosis present

## 2018-08-26 DIAGNOSIS — Z993 Dependence on wheelchair: Secondary | ICD-10-CM | POA: Diagnosis not present

## 2018-08-26 DIAGNOSIS — Z9841 Cataract extraction status, right eye: Secondary | ICD-10-CM | POA: Diagnosis not present

## 2018-08-26 DIAGNOSIS — K5900 Constipation, unspecified: Secondary | ICD-10-CM | POA: Diagnosis not present

## 2018-08-26 DIAGNOSIS — F329 Major depressive disorder, single episode, unspecified: Secondary | ICD-10-CM | POA: Diagnosis present

## 2018-08-26 DIAGNOSIS — Z79899 Other long term (current) drug therapy: Secondary | ICD-10-CM | POA: Diagnosis not present

## 2018-08-26 DIAGNOSIS — K589 Irritable bowel syndrome without diarrhea: Secondary | ICD-10-CM | POA: Diagnosis present

## 2018-08-26 DIAGNOSIS — Z90710 Acquired absence of both cervix and uterus: Secondary | ICD-10-CM | POA: Diagnosis not present

## 2018-08-26 DIAGNOSIS — Z85038 Personal history of other malignant neoplasm of large intestine: Secondary | ICD-10-CM | POA: Diagnosis not present

## 2018-08-26 DIAGNOSIS — Z90721 Acquired absence of ovaries, unilateral: Secondary | ICD-10-CM | POA: Diagnosis not present

## 2018-08-26 DIAGNOSIS — Z66 Do not resuscitate: Secondary | ICD-10-CM | POA: Diagnosis present

## 2018-08-26 DIAGNOSIS — Z961 Presence of intraocular lens: Secondary | ICD-10-CM | POA: Diagnosis present

## 2018-08-26 DIAGNOSIS — Z9049 Acquired absence of other specified parts of digestive tract: Secondary | ICD-10-CM | POA: Diagnosis not present

## 2018-08-26 DIAGNOSIS — Z8673 Personal history of transient ischemic attack (TIA), and cerebral infarction without residual deficits: Secondary | ICD-10-CM | POA: Diagnosis not present

## 2018-08-26 DIAGNOSIS — Z9842 Cataract extraction status, left eye: Secondary | ICD-10-CM | POA: Diagnosis not present

## 2018-08-26 LAB — BASIC METABOLIC PANEL
Anion gap: 6 (ref 5–15)
BUN: 24 mg/dL — AB (ref 8–23)
CO2: 29 mmol/L (ref 22–32)
Calcium: 8.5 mg/dL — ABNORMAL LOW (ref 8.9–10.3)
Chloride: 106 mmol/L (ref 98–111)
Creatinine, Ser: 0.65 mg/dL (ref 0.44–1.00)
GFR calc Af Amer: >60 mL/min (ref >60)
GFR calc non Af Amer: >60 mL/min (ref >60)
Glucose, Bld: 114 mg/dL — ABNORMAL HIGH (ref 70–99)
Potassium: 3.1 mmol/L — ABNORMAL LOW (ref 3.5–5.1)
Sodium: 141 mmol/L (ref 135–145)

## 2018-08-26 MED ORDER — POTASSIUM CHLORIDE CRYS ER 20 MEQ PO TBCR
20.0000 meq | EXTENDED_RELEASE_TABLET | ORAL | Status: AC
  Administered 2018-08-26 (×3): 20 meq via ORAL
  Filled 2018-08-26 (×3): qty 1

## 2018-08-26 MED ORDER — ENOXAPARIN SODIUM 40 MG/0.4ML ~~LOC~~ SOLN
40.0000 mg | SUBCUTANEOUS | Status: DC
  Administered 2018-08-26 – 2018-08-27 (×2): 40 mg via SUBCUTANEOUS
  Filled 2018-08-26 (×2): qty 0.4

## 2018-08-26 NOTE — Care Management Obs Status (Signed)
[facility] NOTIFICATION   Patient Details  Name: Diana Hawkins MRN: [id number] Date of Birth: Feb 14, 1932   Medicare Observation Status Notification Given:  No(Attempted to call patient's daughter. Phone rang multiple times, but was not able to leave a voicemail.  Will try again later )    Beverly Sessions, RN 08/26/2018, 3:20 PM

## 2018-08-26 NOTE — Progress Notes (Signed)
[place] at [place] NAME: Diana Hawkins    MR#:  [id number]  DATE OF BIRTH:  [birth year]  SUBJECTIVE: Admitted for fecal impaction, received laxatives, constipation improved.  Had 3 BMs yesterday, no further BMs.  X-ray of abdomen today is concerning for still large amount of stool.  Abdomen also is distended.  Denies abdominal pain.  CHIEF COMPLAINT:   Chief Complaint  Patient presents with  . Tachycardia    REVIEW OF SYSTEMS:    Review of Systems  Constitutional: Negative for chills and fever.  HENT: Negative for hearing loss.   Eyes: Negative for blurred vision, double vision and photophobia.  Respiratory: Negative for cough, hemoptysis and shortness of breath.   Cardiovascular: Negative for palpitations, orthopnea and leg swelling.  Gastrointestinal: Negative for abdominal pain, diarrhea and vomiting.  Genitourinary: Negative for dysuria and urgency.  Musculoskeletal: Negative for myalgias and neck pain.  Skin: Negative for rash.  Neurological: Negative for dizziness, focal weakness, seizures, weakness and headaches.  Psychiatric/Behavioral: The patient does not have insomnia.     Nutrition:  Tolerating Diet: Tolerating PT:      DRUG ALLERGIES:   Allergies  Allergen Reactions  . Codeine Nausea And Vomiting and Other (See Comments)    "feels like I want to climb the walls" and dizziness  . Reglan [Metoclopramide] Other (See Comments)    Stomach cramps    VITALS:  Blood pressure 129/61, pulse 73, temperature 98.3 F (36.8 C), temperature source Oral, resp. rate 15, height 5\' 5"  (1.651 m), weight 54.4 kg, SpO2 95 %.  PHYSICAL EXAMINATION:   Physical Exam  GENERAL:  85 y.o.-year-old patient lying in the bed with no acute distress.  EYES: Pupils equal, round, reactive to light . No scleral icterus. Extraocular muscles intact and has dementia.  HEENT: Head atraumatic, normocephalic. Oropharynx and nasopharynx  clear.  NECK:  Supple, no jugular venous distention. No thyroid enlargement, no tenderness.  LUNGS: Normal breath sounds bilaterally, no wheezing, rales,rhonchi or crepitation. No use of accessory muscles of respiration.  CARDIOVASCULAR: S1, S2 normal. No murmurs, rubs, or gallops.  ABDOMEN: Distended, poor bowel sounds.  EXTREMITIES: No pedal edema, cyanosis, or clubbing.  NEUROLOGIC: no gross neurological deficit. PSYCHIATRIC: The patient is alert and oriented x 3.  SKIN: No obvious rash, lesion, or ulcer.    LABORATORY PANEL:   CBC Recent Labs  Lab 08/25/18 1245  WBC 21.7*  HGB 12.0  HCT 37.8  PLT 261   ------------------------------------------------------------------------------------------------------------------  Chemistries  Recent Labs  Lab 08/24/18 1346 08/26/18 0347  NA 142 141  K 3.6 3.1*  CL 101 106  CO2 30 29  GLUCOSE 199* 114*  BUN 33* 24*  CREATININE 1.24* 0.65  CALCIUM 9.7 8.5*  AST 26  --   ALT 16  --   ALKPHOS 81  --   BILITOT 0.9  --    ------------------------------------------------------------------------------------------------------------------  Cardiac Enzymes Recent Labs  Lab 08/24/18 1346  TROPONINI <0.03   ------------------------------------------------------------------------------------------------------------------  RADIOLOGY:  Dg Abd 1 View  Result Date: 08/26/2018 CLINICAL DATA:  Constipation. EXAM: ABDOMEN - 1 VIEW COMPARISON:  Abdomen and pelvis CT dated 08/24/2018. FINDINGS: Again demonstrated are marked dilatation of the rectum and distal sigmoid colon with a large amount of stool in the rectum. Lesser amount of stool elsewhere in normal caliber colon. Right abdominal surgical clips are again noted. Unremarkable bones. IMPRESSION: Stable marked dilatation of the rectum and distal sigmoid colon with a large amount of  stool in the rectum. Electronically Signed   By: Claudie Revering M.D.   On: 08/26/2018 09:28   Ct Abdomen  Pelvis W Contrast  Result Date: 08/24/2018 CLINICAL DATA:  Constipation.  Nausea. EXAM: CT ABDOMEN AND PELVIS WITH CONTRAST TECHNIQUE: Multidetector CT imaging of the abdomen and pelvis was performed using the standard protocol following bolus administration of intravenous contrast. CONTRAST:  62mL OMNIPAQUE IOHEXOL 300 MG/ML  SOLN COMPARISON:  02/16/2018 FINDINGS: Lower chest: No acute abnormality. Chronic elevation of the left hemidiaphragm. Hepatobiliary: No focal liver abnormality is seen. No gallstones, gallbladder wall thickening, or biliary dilatation. Pancreas: Limited visualization due to compression, grossly unremarkable. Spleen: Normal in size without focal abnormality. Adrenals/Urinary Tract: Adrenal glands are unremarkable. Kidneys are without renal calculi, solid lesion, or hydronephrosis. 17 mm right renal cyst. Bladder is unremarkable. Stomach/Bowel: No evidence of small-bowel obstruction. Postsurgical changes along the cecum. Markedly dilated filled with formed stool rectum and distal sigmoid colon. The remainder of the colon is nearly decompressed. No evidence of small-bowel obstruction. Vascular/Lymphatic: Aortic atherosclerosis. No enlarged abdominal or pelvic lymph nodes. Reproductive: Status post hysterectomy. No adnexal masses. Other: No abdominal wall hernia or abnormality. No abdominopelvic ascites. Musculoskeletal: Spondylosis of the lumbosacral spine. IMPRESSION: Severe dilation of the rectum and distal sigmoid colon with large amount of formed stool. Findings consistent with rectal impaction. No evidence of volvulus or small bowel obstruction, accounting for compressive sequela to the GI tract. No acute findings within the solid abdominal organs. Electronically Signed   By: Fidela Salisbury M.D.   On: 08/24/2018 15:18     ASSESSMENT AND PLAN:   Active Problems:   Abdominal pain   85 year old female patient with history of colon cancer, partial colectomy comes from assisted  living because of abdominal pain and found to have fecal impaction with severe constipation.  CT abdomen showed a massive amount of stool in sigmoid colon, rectum. 1.  Abdominal pain secondary to constipation, patient had 3 BMs yesterday, on stool softeners, x-ray of abdomen is still concerning for large amount of stool in rectum, sigmoid colon, will order tapwater enema again today, continue stool softeners.  Discussed about increasing the fiber in the diet also to prevent recurrent constipations.  Patient daughter mentioned that she drinks only 1 cup of water a day.  Advised the patient to increase the water intake. #2 leukocytosis likely reactive, CBC improved.. #3. elevated lactic acid, received a dose of Zosyn, elevated lactic acid likely due to abdominal pain, constipation rather than infection. 4 history of IBS, has constipation alternating with diarrhea.  History of colon cancer status post surgery.  Discussed with patient daughter today.  Not stable for discharge.  Has abdominal distention, poor bowel sounds, stool sigmoid colon, rectum the colon   all the records are reviewed and case discussed with Care Management/Social Workerr. Management plans discussed with the patient, family and they are in agreement.  CODE STATUS: Full code  TOTAL TIME TAKING CARE OF THIS PATIENT: 40 minutes.   Discussed with daughter.  More than 50% time spent in counseling, coordination, spoke with patient's daughter for about 20 minutes.  Discussed about fluid intake, fiber intake for patient at [facility].  Epifanio Lesches M.D on 08/26/2018 at 12:06 PM  Between 7am to 6pm - Pager - [phone]  After 6pm go to [email or web address] - password [email or web address] [facility]  [facility]  Office  [phone]  CC: Primary care physician; Gala Romney, MD

## 2018-08-26 NOTE — Clinical Social Work Note (Signed)
Clinical Social Work Assessment  Patient Details  Name: Diana Hawkins MRN: [id number] Date of Birth: Dec 10, 1931  Date of referral:  08/26/18               Reason for consult:  Discharge Planning                Permission sought to share information with:    Permission granted to share information::     Name::        Agency::     Relationship::     Contact Information:     Housing/Transportation Living arrangements for the past 2 months:  [place] of Information:  Facility Patient Interpreter Needed:  None Criminal Activity/Legal Involvement Pertinent to Current Situation/Hospitalization:  No - Comment as needed Significant Relationships:    Lives with:  Adult Children Do you feel safe going back to the place where you live?    Need for family participation in patient care:     Care giving concerns:  Patient resides at [facility].   Social Worker assessment / plan:  CSW spoke with Diana Hawkins at [facility] and patient can return at discharge. CSW updated Diana Hawkins regarding patient's plan of care.   Employment status:    Insurance information:  Medicare PT Recommendations:    Information / Referral to community resources:     Patient/Family's Response to care:  Unable to reach daughter at this time.   Patient/Family's Understanding of and Emotional Response to Diagnosis, Current Treatment, and Prognosis:  Unable to reach daughter at this time.   Emotional Assessment Appearance:  Appears stated age Attitude/Demeanor/Rapport:    Affect (typically observed):    Orientation:  Oriented to Self, Oriented to Place Alcohol / Substance use:  Not Applicable Psych involvement (Current and /or in the community):     Discharge Needs  Concerns to be addressed:  Care Coordination Readmission within the last 30 days:  No Current discharge risk:  None Barriers to Discharge:  No Barriers Identified   Shela Leff, LCSW 08/26/2018, 1:56 PM

## 2018-08-26 NOTE — Progress Notes (Signed)
Vonda Antigua, MD [address], [place], [place], [place], [address] [address], [place], [place], [place], [address] Phone: [phone]  Fax: [phone]   Subjective:  Patient reports small bowel movement this morning.  No nausea or vomiting.  Objective: Exam: Vital signs in last 24 hours: Vitals:   08/25/18 1327 08/25/18 2125 08/26/18 0536 08/26/18 1335  BP: (!) 146/71 124/63 129/61 139/68  Pulse: 74 71 73 80  Resp: 17 15 15 16   Temp: 98.5 F (36.9 C) 99.4 F (37.4 C) 98.3 F (36.8 C) 98.1 F (36.7 C)  TempSrc: Oral Oral Oral Oral  SpO2: 97% 93% 95% 99%  Weight:      Height:       Weight change:   Intake/Output Summary (Last 24 hours) at 08/26/2018 1348 Last data filed at 08/26/2018 0900 Gross per 24 hour  Intake 120 ml  Output -  Net 120 ml    General: No acute distress, AAO x3 Abd: Soft, much less distended than yesterday, No HSM Skin: Warm, no rashes Neck: Supple, Trachea midline   Lab Results: Lab Results  Component Value Date   WBC 21.7 (H) 08/25/2018   HGB 12.0 08/25/2018   HCT 37.8 08/25/2018   MCV 88.9 08/25/2018   PLT 261 08/25/2018   Micro Results: Recent Results (from the past 240 hour(s))  MRSA PCR Screening     Status: None   Collection Time: 08/24/18 11:53 PM  Result Value Ref Range Status   MRSA by PCR NEGATIVE NEGATIVE Final    Comment:        The GeneXpert MRSA Assay (FDA approved for NASAL specimens only), is one component of a comprehensive MRSA colonization surveillance program. It is not intended to diagnose MRSA infection nor to guide or monitor treatment for MRSA infections. Performed at [facility], [address], [place], [place] [address]    Studies/Results: Dg Abd 1 View  Result Date: 08/26/2018 CLINICAL DATA:  Constipation. EXAM: ABDOMEN - 1 VIEW COMPARISON:  Abdomen and pelvis CT dated 08/24/2018. FINDINGS: Again demonstrated are marked dilatation of the rectum and distal sigmoid colon  with a large amount of stool in the rectum. Lesser amount of stool elsewhere in normal caliber colon. Right abdominal surgical clips are again noted. Unremarkable bones. IMPRESSION: Stable marked dilatation of the rectum and distal sigmoid colon with a large amount of stool in the rectum. Electronically Signed   By: Claudie Revering M.D.   On: 08/26/2018 09:28   Ct Abdomen Pelvis W Contrast  Result Date: 08/24/2018 CLINICAL DATA:  Constipation.  Nausea. EXAM: CT ABDOMEN AND PELVIS WITH CONTRAST TECHNIQUE: Multidetector CT imaging of the abdomen and pelvis was performed using the standard protocol following bolus administration of intravenous contrast. CONTRAST:  75mL OMNIPAQUE IOHEXOL 300 MG/ML  SOLN COMPARISON:  02/16/2018 FINDINGS: Lower chest: No acute abnormality. Chronic elevation of the left hemidiaphragm. Hepatobiliary: No focal liver abnormality is seen. No gallstones, gallbladder wall thickening, or biliary dilatation. Pancreas: Limited visualization due to compression, grossly unremarkable. Spleen: Normal in size without focal abnormality. Adrenals/Urinary Tract: Adrenal glands are unremarkable. Kidneys are without renal calculi, solid lesion, or hydronephrosis. 17 mm right renal cyst. Bladder is unremarkable. Stomach/Bowel: No evidence of small-bowel obstruction. Postsurgical changes along the cecum. Markedly dilated filled with formed stool rectum and distal sigmoid colon. The remainder of the colon is nearly decompressed. No evidence of small-bowel obstruction. Vascular/Lymphatic: Aortic atherosclerosis. No enlarged abdominal or pelvic lymph nodes. Reproductive: Status post hysterectomy. No adnexal masses.  Other: No abdominal wall hernia or abnormality. No abdominopelvic ascites. Musculoskeletal: Spondylosis of the lumbosacral spine. IMPRESSION: Severe dilation of the rectum and distal sigmoid colon with large amount of formed stool. Findings consistent with rectal impaction. No evidence of volvulus or  small bowel obstruction, accounting for compressive sequela to the GI tract. No acute findings within the solid abdominal organs. Electronically Signed   By: Fidela Salisbury M.D.   On: 08/24/2018 15:18   Medications:  Scheduled Meds: . aspirin  81 mg Oral Daily  . darifenacin  7.5 mg Oral Daily  . docusate sodium  200 mg Oral BID  . enoxaparin (LOVENOX) injection  40 mg Subcutaneous Q24H  . fluticasone  1 spray Each Nare BID  . furosemide  10 mg Oral Daily  . gabapentin  300 mg Oral QHS  . lactase  18,000 Units Oral BID  . mirtazapine  15 mg Oral QHS  . polyethylene glycol powder  1 Container Oral Once  . potassium chloride  20 mEq Oral Q2H  . senna-docusate  2 tablet Oral BID   Continuous Infusions: PRN Meds:.acetaminophen **OR** acetaminophen, colestipol, ondansetron **OR** ondansetron (ZOFRAN) IV   Assessment: Constipation   Plan: Abdominal x-ray this morning showed large amount of stool in the rectum Dr. Governor Specking note from today states she will order tapwater enema Would recommend 2-3 enemas today until moderate to large sized bowel movement Can alternate between 250 cc tap water enema, soapsuds enema or Fleet enema In addition start MiraLAX 34 g daily orally every morning.    LOS: 0 days   Vonda Antigua, MD 08/26/2018, 1:48 PM

## 2018-08-26 NOTE — Progress Notes (Signed)
PHARMACIST - PHYSICIAN COMMUNICATION  CONCERNING:  Enoxaparin (Lovenox) for DVT Prophylaxis    RECOMMENDATION: Patient was prescribed enoxaprin 30mg  q24 hours for VTE prophylaxis due to CrCl being < 30 ml/min. Danley Danker Weights   08/24/18 1311  Weight: 120 lb (54.4 kg)    Body mass index is 19.97 kg/m.  Estimated Creatinine Clearance: 45 mL/min (by C-G formula based on SCr of 0.65 mg/dL).  Patient is candidate for enoxaparin 40mg  every 24 hours based on improvement of CrCl >30 ml/min  DESCRIPTION: Pharmacy has adjusted enoxaparin dose per [facility] policy.  Patient is now receiving enoxaparin 40mg  every 24 hours.   Oswald Hillock, PharmD, BCPS Clinical Pharmacist  08/26/2018 1:12 PM

## 2018-08-27 LAB — CBC
HCT: 38.1 % (ref 36.0–46.0)
Hemoglobin: 12.2 g/dL (ref 12.0–15.0)
MCH: 28.1 pg (ref 26.0–34.0)
MCHC: 32 g/dL (ref 30.0–36.0)
MCV: 87.8 fL (ref 80.0–100.0)
Platelets: 226 10*3/uL (ref 150–400)
RBC: 4.34 MIL/uL (ref 3.87–5.11)
RDW: 14.3 % (ref 11.5–15.5)
WBC: 8.7 10*3/uL (ref 4.0–10.5)
nRBC: 0 % (ref 0.0–0.2)

## 2018-08-27 LAB — BASIC METABOLIC PANEL
Anion gap: 5 (ref 5–15)
BUN: 15 mg/dL (ref 8–23)
CO2: 29 mmol/L (ref 22–32)
Calcium: 8.2 mg/dL — ABNORMAL LOW (ref 8.9–10.3)
Chloride: 102 mmol/L (ref 98–111)
Creatinine, Ser: 0.59 mg/dL (ref 0.44–1.00)
GFR calc Af Amer: >60 mL/min (ref >60)
GFR calc non Af Amer: >60 mL/min (ref >60)
Glucose, Bld: 171 mg/dL — ABNORMAL HIGH (ref 70–99)
POTASSIUM: 3.4 mmol/L — AB (ref 3.5–5.1)
Sodium: 136 mmol/L (ref 135–145)

## 2018-08-27 MED ORDER — PANTOPRAZOLE SODIUM 40 MG PO TBEC
40.0000 mg | DELAYED_RELEASE_TABLET | Freq: Every day | ORAL | Status: DC
  Administered 2018-08-27 – 2018-08-28 (×2): 40 mg via ORAL
  Filled 2018-08-27 (×2): qty 1

## 2018-08-27 MED ORDER — POLYETHYLENE GLYCOL 3350 17 G PO PACK: 17.0000 g | PACK | Freq: Two times a day (BID) | ORAL | Status: DC

## 2018-08-27 NOTE — Progress Notes (Signed)
[place] [facility] [place] NAME: Diana Hawkins    MR#:  [id number]  DATE OF BIRTH:  [birth year]  SUBJECTIVE: Patient seen and evaluated today Had bowel movement Had formed stools Has some heartburn No vomitings     REVIEW OF SYSTEMS:    Review of Systems  Constitutional: Negative for chills and fever.  HENT: Negative for hearing loss.   Eyes: Negative for blurred vision, double vision and photophobia.  Respiratory: Negative for cough, hemoptysis and shortness of breath.   Cardiovascular: Negative for palpitations, orthopnea and leg swelling.  Gastrointestinal: Negative for abdominal pain, diarrhea and vomiting.  Genitourinary: Negative for dysuria and urgency.  Musculoskeletal: Negative for myalgias and neck pain.  Skin: Negative for rash.  Neurological: Negative for dizziness, focal weakness, seizures, weakness and headaches.  Psychiatric/Behavioral: The patient does not have insomnia.   Has constipation  Nutrition:  Tolerating Diet: Tolerating PT:      DRUG ALLERGIES:   Allergies  Allergen Reactions  . Codeine Nausea And Vomiting and Other (See Comments)    "feels like I want to climb the walls" and dizziness  . Reglan [Metoclopramide] Other (See Comments)    Stomach cramps    VITALS:  Blood pressure (!) 157/78, pulse 73, temperature 98 F (36.7 C), temperature source Oral, resp. rate 16, height 5\' 5"  (1.651 m), weight 54.4 kg, SpO2 97 %.  PHYSICAL EXAMINATION:   Physical Exam  GENERAL:  85 y.o.-year-old patient lying in the bed with no acute distress.  EYES: Pupils equal, round, reactive to light . No scleral icterus. Extraocular muscles intact and has dementia.  HEENT: Head atraumatic, normocephalic. Oropharynx and nasopharynx clear.  NECK:  Supple, no jugular venous distention. No thyroid enlargement, no tenderness.  LUNGS: Normal breath sounds bilaterally, no wheezing, rales,rhonchi or crepitation. No use of  accessory muscles of respiration.  CARDIOVASCULAR: S1, S2 normal. No murmurs, rubs, or gallops.  ABDOMEN: Distended, poor bowel sounds.  EXTREMITIES: No pedal edema, cyanosis, or clubbing.  NEUROLOGIC: no gross neurological deficit. PSYCHIATRIC: The patient is alert and oriented x 3.  SKIN: No obvious rash, lesion, or ulcer.    LABORATORY PANEL:   CBC Recent Labs  Lab 08/25/18 1245  WBC 21.7*  HGB 12.0  HCT 37.8  PLT 261   ------------------------------------------------------------------------------------------------------------------  Chemistries  Recent Labs  Lab 08/24/18 1346 08/26/18 0347  NA 142 141  K 3.6 3.1*  CL 101 106  CO2 30 29  GLUCOSE 199* 114*  BUN 33* 24*  CREATININE 1.24* 0.65  CALCIUM 9.7 8.5*  AST 26  --   ALT 16  --   ALKPHOS 81  --   BILITOT 0.9  --    ------------------------------------------------------------------------------------------------------------------  Cardiac Enzymes Recent Labs  Lab 08/24/18 1346  TROPONINI <0.03   ------------------------------------------------------------------------------------------------------------------  RADIOLOGY:  Dg Abd 1 View  Result Date: 08/26/2018 CLINICAL DATA:  Constipation. EXAM: ABDOMEN - 1 VIEW COMPARISON:  Abdomen and pelvis CT dated 08/24/2018. FINDINGS: Again demonstrated are marked dilatation of the rectum and distal sigmoid colon with a large amount of stool in the rectum. Lesser amount of stool elsewhere in normal caliber colon. Right abdominal surgical clips are again noted. Unremarkable bones. IMPRESSION: Stable marked dilatation of the rectum and distal sigmoid colon with a large amount of stool in the rectum. Electronically Signed   By: Claudie Revering M.D.   On: 08/26/2018 09:28     ASSESSMENT AND PLAN:   Active Problems:   Abdominal pain  Constipation   85 year old female patient with history of colon cancer, partial colectomy comes from assisted living because of  abdominal pain and found to have fecal impaction with severe constipation.  CT abdomen showed a massive amount of stool in sigmoid colon, rectum.  1.  Abdominal pain secondary to severe constipation Patient had formed stool bowel movement today Continue laxatives Status post gastroenterology evaluation Adequate fiber and water intake  2. leukocytosis likely reactive Repeat WBC count  3. elevated lactic acid, received a dose of Zosyn, elevated lactic acid likely due to abdominal pain, constipation rather than infection.  4. History of IBS, has constipation alternating with diarrhea.  History of colon cancer status post surgery.   Supportive care  5.  Acute hypokalemia Potassium supplemented Repeat potassium level  All the records are reviewed and case discussed with Care Management/Social Workerr. Management plans discussed with the patient, family and they are in agreement.  CODE STATUS: Full code  TOTAL TIME TAKING CARE OF THIS PATIENT: 35 minutes.   Discussed with daughter.  More than 50% time spent in counseling, coordination, spoke with patient's daughter for about 20 minutes.  Discussed about fluid intake, fiber intake for patient at [facility].  Saundra Shelling M.D on 08/27/2018 at 12:16 PM  Between 7am to 6pm - Pager - [phone]  After 6pm go to [email or web address] - password [email or web address] [facility]  [facility]  Office  [phone]  CC: Primary care physician; Gala Romney, MD

## 2018-08-27 NOTE — Progress Notes (Signed)
Vonda Antigua, MD [address], [place], [place], [place], [address] [address], [place], [place], [place], [address] Phone: [phone]  Fax: [phone]   Subjective: Patient reports 2-3 bowel movements today and yesterday.  States abdominal distention is much better and feels good.  Tolerating oral diet.  No nausea or vomiting.   Objective: Exam: Vital signs in last 24 hours: Vitals:   08/26/18 2013 08/27/18 0534 08/27/18 0812 08/27/18 1417  BP: (!) 169/78 (!) 151/75 (!) 157/78 138/72  Pulse: 73 77 73 77  Resp: 16 16 16 16   Temp:  98.5 F (36.9 C) 98 F (36.7 C) 98.5 F (36.9 C)  TempSrc: Oral Oral Oral Oral  SpO2: 100% 96% 97% 96%  Weight:      Height:       Weight change:  No intake or output data in the 24 hours ending 08/27/18 1608  General: No acute distress, AAO x3 Abd: Soft, NT/ND, No HSM Skin: Warm, no rashes Neck: Supple, Trachea midline   Lab Results: Lab Results  Component Value Date   WBC 8.7 08/27/2018   HGB 12.2 08/27/2018   HCT 38.1 08/27/2018   MCV 87.8 08/27/2018   PLT 226 08/27/2018   Micro Results: Recent Results (from the past 240 hour(s))  MRSA PCR Screening     Status: None   Collection Time: 08/24/18 11:53 PM  Result Value Ref Range Status   MRSA by PCR NEGATIVE NEGATIVE Final    Comment:        The GeneXpert MRSA Assay (FDA approved for NASAL specimens only), is one component of a comprehensive MRSA colonization surveillance program. It is not intended to diagnose MRSA infection nor to guide or monitor treatment for MRSA infections. Performed at [facility], [address], [place], [place] [address]    Studies/Results: Dg Abd 1 View  Result Date: 08/26/2018 CLINICAL DATA:  Constipation. EXAM: ABDOMEN - 1 VIEW COMPARISON:  Abdomen and pelvis CT dated 08/24/2018. FINDINGS: Again demonstrated are marked dilatation of the rectum and distal sigmoid colon with a large amount of stool in the rectum.  Lesser amount of stool elsewhere in normal caliber colon. Right abdominal surgical clips are again noted. Unremarkable bones. IMPRESSION: Stable marked dilatation of the rectum and distal sigmoid colon with a large amount of stool in the rectum. Electronically Signed   By: Claudie Revering M.D.   On: 08/26/2018 09:28   Medications:  Scheduled Meds: . aspirin  81 mg Oral Daily  . darifenacin  7.5 mg Oral Daily  . docusate sodium  200 mg Oral BID  . enoxaparin (LOVENOX) injection  40 mg Subcutaneous Q24H  . fluticasone  1 spray Each Nare BID  . furosemide  10 mg Oral Daily  . gabapentin  300 mg Oral QHS  . lactase  18,000 Units Oral BID  . mirtazapine  15 mg Oral QHS  . pantoprazole  40 mg Oral Daily  . polyethylene glycol  17 g Oral BID  . polyethylene glycol powder  1 Container Oral Once  . senna-docusate  2 tablet Oral BID   Continuous Infusions: PRN Meds:.acetaminophen **OR** acetaminophen, colestipol, ondansetron **OR** ondansetron (ZOFRAN) IV   Assessment: Active Problems:   Abdominal pain   Constipation    Plan: Constipation resolved with enemas and oral bowel regimen To prevent future episodes of constipation patient should remain on oral MiraLAX once or twice a day with goal of 1-2 soft bowel movements daily High fiber diet  GI service  will sign off at this time Patient should follow-up in GI clinic upon discharge.  Primary team to please make an appointment prior to discharge.  Please page with any questions.   LOS: 1 day   Vonda Antigua, MD 08/27/2018, 4:08 PM

## 2018-08-28 LAB — CBC
HCT: 37.3 % (ref 36.0–46.0)
Hemoglobin: 11.9 g/dL — ABNORMAL LOW (ref 12.0–15.0)
MCH: 27.2 pg (ref 26.0–34.0)
MCHC: 31.9 g/dL (ref 30.0–36.0)
MCV: 85.4 fL (ref 80.0–100.0)
Platelets: 248 10*3/uL (ref 150–400)
RBC: 4.37 MIL/uL (ref 3.87–5.11)
RDW: 14 % (ref 11.5–15.5)
WBC: 7.6 10*3/uL (ref 4.0–10.5)
nRBC: 0 % (ref 0.0–0.2)

## 2018-08-28 LAB — BASIC METABOLIC PANEL
Anion gap: 2 — ABNORMAL LOW (ref 5–15)
BUN: 12 mg/dL (ref 8–23)
CO2: 33 mmol/L — AB (ref 22–32)
Calcium: 8.1 mg/dL — ABNORMAL LOW (ref 8.9–10.3)
Chloride: 102 mmol/L (ref 98–111)
Creatinine, Ser: 0.51 mg/dL (ref 0.44–1.00)
GFR calc Af Amer: >60 mL/min (ref >60)
GFR calc non Af Amer: >60 mL/min (ref >60)
GLUCOSE: 104 mg/dL — AB (ref 70–99)
Potassium: 3.1 mmol/L — ABNORMAL LOW (ref 3.5–5.1)
Sodium: 137 mmol/L (ref 135–145)

## 2018-08-28 MED ORDER — POTASSIUM CHLORIDE CRYS ER 20 MEQ PO TBCR
40.0000 meq | EXTENDED_RELEASE_TABLET | Freq: Once | ORAL | Status: AC
  Administered 2018-08-28: 40 meq via ORAL
  Filled 2018-08-28: qty 2

## 2018-08-28 MED ORDER — POLYETHYLENE GLYCOL 3350 17 GM/SCOOP PO POWD: 17.0000 g | Freq: Two times a day (BID) | ORAL | 3 refills | Status: AC

## 2018-08-28 NOTE — NC FL2 (Signed)
[place] LEVEL OF CARE SCREENING TOOL     IDENTIFICATION  Patient Name: Diana Hawkins Birthdate: 20-Jun-1932 Sex: female Admission Date (Current Location): 08/24/2018  [place] and [place] Number:  Engineering geologist and Address:  [facility], [address] [place], [place] [address]      Provider Number: [id number]  Attending Physician Name and Address:  Saundra Shelling, MD  Relative Name and Phone Number:  Tor Netters Daughter [phone]  [phone]     Current Level of Care: Hospital Recommended Level of Care: [place] Prior Approval Number:    Date Approved/Denied:   PASRR Number:    Discharge Plan: Domiciliary (Rest home)    Current Diagnoses: Patient Active Problem List   Diagnosis Date Noted  . Constipation 08/26/2018  . Abdominal pain 08/24/2018  . Cognitive deficit, post-stroke 10/04/2014  . Trochanteric bursitis of left hip 10/04/2014  . SAH (subarachnoid hemorrhage) ([place]) 09/26/2014  . Subarachnoid hemorrhage ([place]) 09/22/2014    Orientation RESPIRATION BLADDER Height & Weight     Self, Time  Normal Continent Weight: 120 lb (54.4 kg) Height:  5\' 5"  (165.1 cm)  BEHAVIORAL SYMPTOMS/MOOD NEUROLOGICAL BOWEL NUTRITION STATUS      Continent Diet(Heart healthy)  AMBULATORY STATUS COMMUNICATION OF NEEDS Skin   Independent Verbally Normal                       Personal Care Assistance Level of Assistance  Bathing, Feeding, Dressing Bathing Assistance: Limited assistance Feeding assistance: Independent Dressing Assistance: Limited assistance     Functional Limitations Info             SPECIAL CARE FACTORS FREQUENCY                       Contractures Contractures Info: Not present    Additional Factors Info  Code Status, Allergies Code Status Info: Full Allergies Info: Codeine, Reglan Metoclopramide           Current Medications (08/28/2018):  This is the current  hospital active medication list Current Facility-Administered Medications  Medication Dose Route Frequency Provider Last Rate Last Dose  . acetaminophen (TYLENOL) tablet 650 mg  650 mg Oral Q6H PRN Dustin Flock, MD       Or  . acetaminophen (TYLENOL) suppository 650 mg  650 mg Rectal Q6H PRN Dustin Flock, MD      . aspirin chewable tablet 81 mg  81 mg Oral Daily Dustin Flock, MD   81 mg at 08/28/18 1049  . colestipol (COLESTID) packet 5 g  5 g Oral Q12H PRN Dustin Flock, MD   5 g at 08/27/18 1101  . darifenacin (ENABLEX) 24 hr tablet 7.5 mg  7.5 mg Oral Daily Dustin Flock, MD   7.5 mg at 08/28/18 1046  . docusate sodium (COLACE) capsule 200 mg  200 mg Oral BID Dustin Flock, MD   200 mg at 08/27/18 2353  . enoxaparin (LOVENOX) injection 40 mg  40 mg Subcutaneous Q24H Epifanio Lesches, MD   40 mg at 08/27/18 2353  . fluticasone (FLONASE) 50 MCG/ACT nasal spray 1 spray  1 spray Each Nare BID Dustin Flock, MD   1 spray at 08/28/18 1046  . furosemide (LASIX) tablet 10 mg  10 mg Oral Daily Dustin Flock, MD   10 mg at 08/28/18 1048  . gabapentin (NEURONTIN) capsule 300 mg  300 mg Oral QHS Dustin Flock, MD   300 mg at 08/27/18 2354  .  lactase (LACTAID) tablet 18,000 Units  18,000 Units Oral BID Dustin Flock, MD   18,000 Units at 08/28/18 1046  . mirtazapine (REMERON) tablet 15 mg  15 mg Oral QHS Dustin Flock, MD   15 mg at 08/27/18 2353  . ondansetron (ZOFRAN) tablet 4 mg  4 mg Oral Q6H PRN Dustin Flock, MD       Or  . ondansetron [facility]) injection 4 mg  4 mg Intravenous Q6H PRN Dustin Flock, MD   4 mg at 08/27/18 2131  . pantoprazole (PROTONIX) EC tablet 40 mg  40 mg Oral Daily Pyreddy, Reatha Harps, MD   40 mg at 08/28/18 1102  . polyethylene glycol (MIRALAX / GLYCOLAX) packet 17 g  17 g Oral BID Pyreddy, Pavan, MD      . polyethylene glycol powder (GLYCOLAX/MIRALAX) container 255 g  1 Container Oral Once Dustin Flock, MD      . potassium chloride SA  (K-DUR,KLOR-CON) CR tablet 40 mEq  40 mEq Oral Once Saundra Shelling, MD      . senna-docusate (Senokot-S) tablet 2 tablet  2 tablet Oral BID Dustin Flock, MD   2 tablet at 08/27/18 2353     Discharge Medications: Medication List    STOP taking these medications   loperamide 2 MG tablet Commonly known as:  IMODIUM A-D     TAKE these medications   acetaminophen 650 MG CR tablet Commonly known as:  TYLENOL Take 650 mg by mouth at bedtime.   Biotin 300 MCG Tabs Take 300 mcg by mouth every evening.   carboxymethylcellulose 0.5 % Soln Commonly known as:  REFRESH PLUS Place 1 drop into both eyes 3 (three) times daily as needed (for dry eyes).   colestipol 5 g packet Commonly known as:  COLESTID Take 5 g by mouth every 12 (twelve) hours as needed (for diarrhea).   DAILY VITE Tabs Take 1 tablet by mouth daily.   fluticasone 50 MCG/ACT nasal spray Commonly known as:  FLONASE Place 1 spray into both nostrils 2 (two) times daily.   furosemide 20 MG tablet Commonly known as:  LASIX Take 10 mg by mouth daily.   gabapentin 100 MG capsule Commonly known as:  NEURONTIN Take 1 capsule (100 mg total) by mouth 3 (three) times daily. What changed:    how much to take  when to take this   LACTAID FAST ACT 9000 units Tabs Generic drug:  Lactase Take 18,000 Units by mouth 2 (two) times daily.   METAMUCIL 28.3 % Powd Generic drug:  Psyllium Take 20 mLs by mouth as needed (for constipation).   mirtazapine 15 MG tablet Commonly known as:  REMERON Take 15 mg by mouth at bedtime.   ondansetron 8 MG disintegrating tablet Commonly known as:  ZOFRAN-ODT Take 8 mg by mouth 3 (three) times daily as needed for nausea or vomiting.   oyster calcium 500 MG Tabs tablet Take 1,000 mg of elemental calcium by mouth daily.   polyethylene glycol powder powder Commonly known as:  GLYCOLAX/MIRALAX Take 17 g by mouth 2 (two) times daily. What changed:  when to take this    potassium chloride 10 MEQ tablet Commonly known as:  K-DUR,KLOR-CON Take 10 mEq by mouth daily.   RISA-BID PROBIOTIC Tabs Take 1 tablet by mouth every evening.   senna-docusate 8.6-50 MG tablet Commonly known as:  Senokot-S Take 2 tablets by mouth 2 (two) times daily.   simethicone 80 MG chewable tablet Commonly known as:  MYLICON Chew 80 mg by mouth  every 6 (six) hours as needed for flatulence.   solifenacin 5 MG tablet Commonly known as:  VESICARE Take 5 mg by mouth daily.        Relevant Imaging Results:  Relevant Lab Results:   Additional Information    Zettie Pho, LCSW

## 2018-08-28 NOTE — Discharge Summary (Signed)
Diana Hawkins at [place] NAME: Diana Hawkins    MR#:  [id number]  DATE OF BIRTH:  Apr 24, 1932  DATE OF ADMISSION:  08/24/2018 ADMITTING PHYSICIAN: Dustin Flock, MD  DATE OF DISCHARGE: 08/28/2018  PRIMARY CARE PHYSICIAN: Gala Romney, MD   ADMISSION DIAGNOSIS:  Lactic acid acidosis [E87.2] Obstipation [K59.00] Leukocytosis, unspecified type [D72.829]  DISCHARGE DIAGNOSIS:  Active Problems:   Abdominal pain   Constipation Hypertension History of CVA  SECONDARY DIAGNOSIS:   Past Medical History:  Diagnosis Date  . Anemia 07/2008   extreme anemia  . Depressive disorder   . Hepatitis A 01/1976  . Hypertension   . Irregular heartbeat 02/2001   Irregular heartbeat checked  . Stroke [facility])      ADMITTING HISTORY Diana Hawkins  is a 85 y.o. female with a known history of depressive disorder, previous stroke who currently resides at [facility] assisted living brought in with abdominal pain ongoing for the past few days.  Patient states that she has not had a bowel movement in 6 days.  Any fevers chills no chest pain or palpitations.  HOSPITAL COURSE:  Patient was admitted to medical floor.  She is mostly wheelchair-bound at the facility.  She was given Fleet enema MiraLAX preparation continued senna and Colace.  Her leukocytosis completely resolved which might be secondary to severe constipation.  No evidence of any infection.  Potassium was replaced.  Repeat abdominal x-rays revealed a decreased amount of stool burden.  She had good formed stools and had good bowel movements during the hospitalization after laxatives were started.  Patient tolerated diet well.  MRSA PCR was negative.  Patient will be discharged back to [facility] assisted living facility with laxatives.  During hospitalization gastroenterology consultation was done who recommended high-fiber diet and laxatives to continue.  Patient was also seen by surgical attending during the  hospitalization.  They commented that patient does not have any evidence of any bowel ischemia.  Advised tapwater enemas and MiraLAX and high-fiber diet.  No intervention recommended. Recommend high-fiber diet  CONSULTS OBTAINED:  Gastroenterology consultation with Dr. Bonna Gains Surgery consultation with Dr. Fredirick Maudlin  DRUG ALLERGIES:   Allergies  Allergen Reactions  . Codeine Nausea And Vomiting and Other (See Comments)    "feels like I want to climb the walls" and dizziness  . Reglan [Metoclopramide] Other (See Comments)    Stomach cramps    DISCHARGE MEDICATIONS:   Allergies as of 08/28/2018      Reactions   Codeine Nausea And Vomiting, Other (See Comments)   "feels like I want to climb the walls" and dizziness   Reglan [metoclopramide] Other (See Comments)   Stomach cramps      Medication List    STOP taking these medications   loperamide 2 MG tablet Commonly known as:  IMODIUM A-D     TAKE these medications   acetaminophen 650 MG CR tablet Commonly known as:  TYLENOL Take 650 mg by mouth at bedtime.   Biotin 300 MCG Tabs Take 300 mcg by mouth every evening.   carboxymethylcellulose 0.5 % Soln Commonly known as:  REFRESH PLUS Place 1 drop into both eyes 3 (three) times daily as needed (for dry eyes).   colestipol 5 g packet Commonly known as:  COLESTID Take 5 g by mouth every 12 (twelve) hours as needed (for diarrhea).   DAILY VITE Tabs Take 1 tablet by mouth daily.   fluticasone 50 MCG/ACT nasal spray Commonly known as:  FLONASE Place 1 spray into both nostrils 2 (two) times daily.   furosemide 20 MG tablet Commonly known as:  LASIX Take 10 mg by mouth daily.   gabapentin 100 MG capsule Commonly known as:  NEURONTIN Take 1 capsule (100 mg total) by mouth 3 (three) times daily. What changed:    how much to take  when to take this   LACTAID FAST ACT 9000 units Tabs Generic drug:  Lactase Take 18,000 Units by mouth 2 (two) times daily.    METAMUCIL 28.3 % Powd Generic drug:  Psyllium Take 20 mLs by mouth as needed (for constipation).   mirtazapine 15 MG tablet Commonly known as:  REMERON Take 15 mg by mouth at bedtime.   ondansetron 8 MG disintegrating tablet Commonly known as:  ZOFRAN-ODT Take 8 mg by mouth 3 (three) times daily as needed for nausea or vomiting.   oyster calcium 500 MG Tabs tablet Take 1,000 mg of elemental calcium by mouth daily.   polyethylene glycol powder powder Commonly known as:  GLYCOLAX/MIRALAX Take 17 g by mouth 2 (two) times daily. What changed:  when to take this   potassium chloride 10 MEQ tablet Commonly known as:  K-DUR,KLOR-CON Take 10 mEq by mouth daily.   RISA-BID PROBIOTIC Tabs Take 1 tablet by mouth every evening.   senna-docusate 8.6-50 MG tablet Commonly known as:  Senokot-S Take 2 tablets by mouth 2 (two) times daily.   simethicone 80 MG chewable tablet Commonly known as:  MYLICON Chew 80 mg by mouth every 6 (six) hours as needed for flatulence.   solifenacin 5 MG tablet Commonly known as:  VESICARE Take 5 mg by mouth daily.       Today  Patient seen on the day of discharge Tolerating diet well Had good bowel movements Hemodynamically stable Will be discharged back to assisted living facility VITAL SIGNS:  Blood pressure (!) 156/73, pulse 73, temperature 99.3 F (37.4 C), temperature source Oral, resp. rate 18, height 5\' 5"  (1.651 m), weight 54.4 kg, SpO2 96 %.  I/O:    Intake/Output Summary (Last 24 hours) at 08/28/2018 1054 Last data filed at 08/28/2018 0942 Gross per 24 hour  Intake 240 ml  Output -  Net 240 ml    PHYSICAL EXAMINATION:  Physical Exam  GENERAL:  85 y.o.-year-old patient lying in the bed with no acute distress.  LUNGS: Normal breath sounds bilaterally, no wheezing, rales,rhonchi or crepitation. No use of accessory muscles of respiration.  CARDIOVASCULAR: S1, S2 normal. No murmurs, rubs, or gallops.  ABDOMEN: Soft, non-tender,  non-distended. Bowel sounds present. No organomegaly or mass.  NEUROLOGIC: Moves all 4 extremities. PSYCHIATRIC: The patient is alert and oriented x 3.  SKIN: No obvious rash, lesion, or ulcer.   DATA REVIEW:   CBC Recent Labs  Lab 08/28/18 0332  WBC 7.6  HGB 11.9*  HCT 37.3  PLT 248    Chemistries  Recent Labs  Lab 08/24/18 1346  08/28/18 0332  NA 142   < > 137  K 3.6   < > 3.1*  CL 101   < > 102  CO2 30   < > 33*  GLUCOSE 199*   < > 104*  BUN 33*   < > 12  CREATININE 1.24*   < > 0.51  CALCIUM 9.7   < > 8.1*  AST 26  --   --   ALT 16  --   --   ALKPHOS 81  --   --  BILITOT 0.9  --   --    < > = values in this interval not displayed.    Cardiac Enzymes Recent Labs  Lab 08/24/18 1346  TROPONINI <0.03    Microbiology Results  Results for orders placed or performed during the hospital encounter of 08/24/18  MRSA PCR Screening     Status: None   Collection Time: 08/24/18 11:53 PM  Result Value Ref Range Status   MRSA by PCR NEGATIVE NEGATIVE Final    Comment:        The GeneXpert MRSA Assay (FDA approved for NASAL specimens only), is one component of a comprehensive MRSA colonization surveillance program. It is not intended to diagnose MRSA infection nor to guide or monitor treatment for MRSA infections. Performed at [facility], [address], [place], [place] [address]     RADIOLOGY:  No results found.  Follow up with PCP in 1 week.  Management plans discussed with the patient, family and they are in agreement.  CODE STATUS: Full code    Code Status Orders  (From admission, onward)         Start     Ordered   08/24/18 1857  Full code  Continuous     08/24/18 1856        Code Status History    Date Active Date Inactive Code Status Order ID Comments User Context   09/26/2014 1918 10/05/2014 1736 DNR [id number]  Cristi Loron, RN Inpatient   09/26/2014 1814 09/26/2014 1918 Full Code [id number]  Cathlyn Parsons, PA-C  Inpatient   09/26/2014 1814 09/26/2014 1814 Full Code [id number]  Cathlyn Parsons, PA-C Inpatient   09/22/2014 1046 09/26/2014 1814 Full Code [id number]  [name], MD Inpatient    Advance Directive Documentation     Most Recent Value  Type of Advance Directive  Living will, Healthcare Power of Attorney  Pre-existing out of facility DNR order (yellow form or pink MOST form)  -  "MOST" Form in Place?  -      TOTAL TIME TAKING CARE OF THIS PATIENT ON DAY OF DISCHARGE: more than 35 minutes.   Saundra Shelling M.D on 08/28/2018 at 10:54 AM  Between 7am to 6pm - Pager - [phone]  After 6pm go to [email or web address] - password [email or web address] [facility]  Office  [phone]  CC: Primary care physician; Gala Romney, MD  Note: This dictation was prepared with Dragon dictation along with smaller phrase technology. Any transcriptional errors that result from this process are unintentional.

## 2018-08-28 NOTE — Clinical Social Work Note (Signed)
The patient will discharge today to [facility] via non-emergent EMS. The patient's daughter and the facility are aware and in agreement with the discharge. The CSW has sent all discharge paperwork to the facility. The CSW will deliver the discharge packet as soon as possible. At that time, the CSW will sign off. Please consult should needs arise.  Santiago Bumpers, MSW, Latanya Presser [phone]

## 2019-01-01 ENCOUNTER — Other Ambulatory Visit: Payer: Self-pay

## 2019-01-01 ENCOUNTER — Emergency Department: Payer: Medicare Other

## 2019-01-01 ENCOUNTER — Encounter: Payer: Self-pay | Admitting: Emergency Medicine

## 2019-01-01 ENCOUNTER — Emergency Department
Admission: EM | Admit: 2019-01-01 | Discharge: 2019-01-01 | Disposition: A | Discharge status: Skilled Nursing Facility | Payer: Medicare Other | Attending: Emergency Medicine | Admitting: Emergency Medicine

## 2019-01-01 DIAGNOSIS — I10 Essential (primary) hypertension: Secondary | ICD-10-CM | POA: Insufficient documentation

## 2019-01-01 DIAGNOSIS — R202 Paresthesia of skin: Secondary | ICD-10-CM | POA: Diagnosis present

## 2019-01-01 DIAGNOSIS — Z1159 Encounter for screening for other viral diseases: Secondary | ICD-10-CM | POA: Diagnosis not present

## 2019-01-01 DIAGNOSIS — Z79899 Other long term (current) drug therapy: Secondary | ICD-10-CM | POA: Diagnosis not present

## 2019-01-01 DIAGNOSIS — R531 Weakness: Secondary | ICD-10-CM | POA: Insufficient documentation

## 2019-01-01 LAB — BASIC METABOLIC PANEL WITH GFR
Anion gap: 8 (ref 5–15)
BUN: 21 mg/dL (ref 8–23)
CO2: 29 mmol/L (ref 22–32)
Calcium: 9.2 mg/dL (ref 8.9–10.3)
Chloride: 106 mmol/L (ref 98–111)
Creatinine, Ser: 0.82 mg/dL (ref 0.44–1.00)
GFR calc Af Amer: >60 mL/min
GFR calc non Af Amer: >60 mL/min
Glucose, Bld: 98 mg/dL (ref 70–99)
Potassium: 3.8 mmol/L (ref 3.5–5.1)
Sodium: 143 mmol/L (ref 135–145)

## 2019-01-01 LAB — URINALYSIS, COMPLETE (UACMP) WITH MICROSCOPIC
Bacteria, UA: NONE SEEN
Bilirubin Urine: NEGATIVE
Glucose, UA: NEGATIVE mg/dL
Hgb urine dipstick: NEGATIVE
Ketones, ur: NEGATIVE mg/dL
Leukocytes,Ua: NEGATIVE
Nitrite: NEGATIVE
Protein, ur: NEGATIVE mg/dL
Specific Gravity, Urine: 1.009 (ref 1.005–1.030)
pH: 7 (ref 5.0–8.0)

## 2019-01-01 LAB — CBC WITH DIFFERENTIAL/PLATELET
Abs Immature Granulocytes: 0.02 10*3/uL (ref 0.00–0.07)
Basophils Absolute: 0.1 10*3/uL (ref 0.0–0.1)
Basophils Relative: 1 %
Eosinophils Absolute: 0.2 10*3/uL (ref 0.0–0.5)
Eosinophils Relative: 2 %
HCT: 45.7 % (ref 36.0–46.0)
Hemoglobin: 14.3 g/dL (ref 12.0–15.0)
Immature Granulocytes: 0 %
Lymphocytes Relative: 26 %
Lymphs Abs: 1.8 10*3/uL (ref 0.7–4.0)
MCH: 27.2 pg (ref 26.0–34.0)
MCHC: 31.3 g/dL (ref 30.0–36.0)
MCV: 86.9 fL (ref 80.0–100.0)
Monocytes Absolute: 0.5 10*3/uL (ref 0.1–1.0)
Monocytes Relative: 8 %
Neutro Abs: 4.3 10*3/uL (ref 1.7–7.7)
Neutrophils Relative %: 63 %
Platelets: 309 10*3/uL (ref 150–400)
RBC: 5.26 MIL/uL — ABNORMAL HIGH (ref 3.87–5.11)
RDW: 15.1 % (ref 11.5–15.5)
WBC: 6.8 10*3/uL (ref 4.0–10.5)
nRBC: 0 % (ref 0.0–0.2)

## 2019-01-01 LAB — HEPATIC FUNCTION PANEL
ALT: 15 U/L (ref 0–44)
AST: 20 U/L (ref 15–41)
Albumin: 3.7 g/dL (ref 3.5–5.0)
Alkaline Phosphatase: 78 U/L (ref 38–126)
Bilirubin, Direct: <0.1 mg/dL (ref 0.0–0.2)
Total Bilirubin: 0.6 mg/dL (ref 0.3–1.2)
Total Protein: 7.4 g/dL (ref 6.5–8.1)

## 2019-01-01 LAB — TROPONIN I: Troponin I: <0.03 ng/mL (ref <0.03)

## 2019-01-01 LAB — SARS CORONAVIRUS 2 BY RT PCR (HOSPITAL ORDER, PERFORMED IN ~~LOC~~ HOSPITAL LAB): SARS Coronavirus 2: NEGATIVE

## 2019-01-01 LAB — LACTIC ACID, PLASMA: Lactic Acid, Venous: 1 mmol/L (ref 0.5–1.9)

## 2019-01-01 NOTE — ED Notes (Signed)
Attempted to call [facility] X 3 with no answer for report/handoff.  Attempted to call daughter again with no answer; mans voice on voicemail, no message left.

## 2019-01-01 NOTE — ED Notes (Signed)
Still unable to reach nurse.  Secretary answered on 5th and 6th call but when transferred to RN no answer; requested she let them know pt is coming back.

## 2019-01-01 NOTE — ED Notes (Signed)
Waiting on EMS transport 

## 2019-01-01 NOTE — ED Notes (Signed)
Attempted to call and update patients daughter, unable to reach her.

## 2019-01-01 NOTE — ED Notes (Signed)
Pt did not sign, EMS verbalized understanding.

## 2019-01-01 NOTE — ED Notes (Signed)
In and out cath done with sterile technique maintained.  Scott NT at bedside.

## 2019-01-01 NOTE — ED Provider Notes (Signed)
[facility] Emergency Department Provider Note ____________________________________________   First MD Initiated Contact with Patient 01/01/19 [phone]     (approximate)  I have reviewed the triage vital signs and the nursing notes.   HISTORY  Chief Complaint Numbness    HPI Diana Hawkins is a 85 y.o. female with PMH as noted below who presents with bilateral hand and leg tingling, mainly from the elbows and knees distally, occurring over the last few days, and not associated with pain.  The patient states that she has had increased weakness in her right leg over the last few weeks, and she feels like she is leaning to the right side.  She denies headache, fever, shortness of breath, or any GI symptoms.  Past Medical History:  Diagnosis Date  . Anemia 07/2008   extreme anemia  . Depressive disorder   . Hepatitis A 01/1976  . Hypertension   . Irregular heartbeat 02/2001   Irregular heartbeat checked  . Stroke [facility])     Patient Active Problem List   Diagnosis Date Noted  . Constipation 08/26/2018  . Abdominal pain 08/24/2018  . Cognitive deficit, post-stroke 10/04/2014  . Trochanteric bursitis of left hip 10/04/2014  . SAH (subarachnoid hemorrhage) ([place]) 09/26/2014  . Subarachnoid hemorrhage ([place]) 09/22/2014    Past Surgical History:  Procedure Laterality Date  . ABDOMINAL HYSTERECTOMY  01/1968   Complete   . BREAST SURGERY Right 11/1966   Benign tumor right breast removed  . CATARACT EXTRACTION W/ INTRAOCULAR LENS IMPLANT Bilateral 04/03 L eye; 06/03 R eye  . COLON SURGERY  1/10   Stage 3 Cancer  . IR GENERIC HISTORICAL  09/22/2014   IR ANGIO INTRA EXTRACRAN SEL COM CAROTID INNOMINATE BILAT MOD SED 09/22/2014 [name], MD [facility]-INTERV RAD  . IR GENERIC HISTORICAL  09/22/2014   IR ANGIO VERTEBRAL SEL VERTEBRAL UNI R MOD SED 09/22/2014 [name], MD [facility]-INTER[facility] RAD  . OOPHORECTOMY Right 08/1964   Right Ovary Removed, benign tumor  .  PANCREATIC CYST DRAINAGE  03/2012   Benign    Prior to Admission medications   Medication Sig Start Date End Date Taking? Authorizing Provider  acetaminophen (TYLENOL) 650 MG CR tablet Take 650 mg by mouth at bedtime.    [provider]  Biotin 300 MCG TABS Take 300 mcg by mouth every evening.     [provider]  carboxymethylcellulose (REFRESH PLUS) 0.5 % SOLN Place 1 drop into both eyes 3 (three) times daily as needed (for dry eyes).    [provider]  colestipol (COLESTID) 5 g packet Take 5 g by mouth every 12 (twelve) hours as needed (for diarrhea).    [provider]  fluticasone (FLONASE) 50 MCG/ACT nasal spray Place 1 spray into both nostrils 2 (two) times daily.    [provider]  furosemide (LASIX) 20 MG tablet Take 10 mg by mouth daily.    [provider]  gabapentin (NEURONTIN) 100 MG capsule Take 1 capsule (100 mg total) by mouth 3 (three) times daily. Patient taking differently: Take 300 mg by mouth at bedtime.  04/29/15   Mesner, Corene Cornea, MD  Lactase (LACTAID FAST ACT) 9000 units TABS Take 18,000 Units by mouth 2 (two) times daily.    [provider]  mirtazapine (REMERON) 15 MG tablet Take 15 mg by mouth at bedtime.    [provider]  Multiple Vitamin (DAILY VITE) TABS Take 1 tablet by mouth daily.    [provider]  ondansetron (  ZOFRAN-ODT) 8 MG disintegrating tablet Take 8 mg by mouth 3 (three) times daily as needed for nausea or vomiting.    [provider]  Loma [place] (OYSTER CALCIUM) 500 MG TABS tablet Take 1,000 mg of elemental calcium by mouth daily.    [provider]  polyethylene glycol powder (GLYCOLAX/MIRALAX) powder Take 17 g by mouth 2 (two) times daily. 08/28/18 08/28/19  Saundra Shelling, MD  potassium chloride (K-DUR,KLOR-CON) 10 MEQ tablet Take 10 mEq by mouth daily.    [provider]  Probiotic Product (RISA-BID PROBIOTIC) TABS Take 1 tablet by mouth every  evening.    [provider]  Psyllium (METAMUCIL) 28.3 % POWD Take 20 mLs by mouth as needed (for constipation).    [provider]  senna-docusate (SENOKOT-S) 8.6-50 MG tablet Take 2 tablets by mouth 2 (two) times daily. 07/06/18   Carrie Mew, MD  simethicone (MYLICON) 80 MG chewable tablet Chew 80 mg by mouth every 6 (six) hours as needed for flatulence.    [provider]  solifenacin (VESICARE) 5 MG tablet Take 5 mg by mouth daily.    [provider]    Allergies Codeine and Reglan [metoclopramide]  History reviewed. No pertinent family history.  Social History Social History   Tobacco Use  . Smoking status: Never Smoker  . Smokeless tobacco: Never Used  Substance Use Topics  . Alcohol use: No    Alcohol/week: 0.0 standard drinks  . Drug use: No    Review of Systems  Constitutional: No fever. Eyes: No visual changes. ENT: No neck pain. Cardiovascular: Denies chest pain. Respiratory: Denies shortness of breath. Gastrointestinal: No vomiting or diarrhea.  Genitourinary: Negative for dysuria.  Musculoskeletal: Negative for back pain. Skin: Negative for rash. Neurological: Negative for headaches.  Positive for bilateral upper and lower extremity tingling, and right leg weakness.   ____________________________________________   PHYSICAL EXAM:  VITAL SIGNS: ED Triage Vitals  Enc Vitals Group     BP 01/01/19 0720 (!) 162/86     Pulse Rate 01/01/19 0720 89     Resp 01/01/19 0720 12     Temp 01/01/19 0720 98.4 F (36.9 C)     Temp Source 01/01/19 0720 Oral     SpO2 01/01/19 0720 99 %     Weight 01/01/19 0716 121 lb (54.9 kg)     Height 01/01/19 0716 5\' 5"  (1.651 m)     Head Circumference --      Peak Flow --      Pain Score 01/01/19 0715 0     Pain Loc --      Pain Edu? --      Excl. in [place]? --     Constitutional: Alert and oriented.  Chronically weak appearing but no acute distress. Eyes: Conjunctivae are normal.   EOMI.  PERRLA. Head: Atraumatic. Nose: No congestion/rhinnorhea. Mouth/Throat: Mucous membranes are moist.   Neck: Normal range of motion.  Cardiovascular: Normal rate, regular rhythm. Good peripheral circulation. Respiratory: Normal respiratory effort.   Gastrointestinal: No distention.  Musculoskeletal: No lower extremity edema.  Extremities warm and well perfused.  Neurologic:  Normal speech and language.  No facial droop or asymmetry.  No pronator drift.  Normal coordination.  5/5 motor strength to bilateral upper extremities.  4/5 motor strength of bilateral lower extremities with no asymmetry. Skin:  Skin is warm and dry. No rash noted. Psychiatric: Mood and affect are normal. Speech and behavior are normal.  ____________________________________________   LABS (all labs ordered  are listed, but only abnormal results are displayed)  Labs Reviewed  CBC WITH DIFFERENTIAL/PLATELET - Abnormal; Notable for the following components:      Result Value   RBC 5.26 (*)    All other components within normal limits  URINALYSIS, COMPLETE (UACMP) WITH MICROSCOPIC - Abnormal; Notable for the following components:   Color, Urine STRAW (*)    APPearance CLEAR (*)    All other components within normal limits  SARS CORONAVIRUS 2 (HOSPITAL ORDER, [place] [facility])  BASIC METABOLIC PANEL  HEPATIC FUNCTION PANEL  TROPONIN I  LACTIC ACID, PLASMA   ____________________________________________  EKG  ED ECG REPORT I, Arta Silence, the attending physician, personally viewed and interpreted this ECG.  Date: 01/01/2019 EKG Time: 07 20 Rate: 98 Rhythm: normal sinus rhythm QRS Axis: normal Intervals: normal ST/T Wave abnormalities: Nonspecific chronic ST abnormalities Narrative Interpretation: no evidence of acute ischemia  ____________________________________________  RADIOLOGY  CT head: Chronic ischemic changes with no acute abnormality   ____________________________________________   PROCEDURES  Procedure(s) performed: No  Procedures  Critical Care performed: No ____________________________________________   INITIAL IMPRESSION / ASSESSMENT AND PLAN / ED COURSE  Pertinent labs & imaging results that were available during my care of the patient were reviewed by me and considered in my medical decision making (see chart for details).  85 year old female with PMH as noted above including prior history of stroke and anemia who presents with bilateral upper and lower extremity tingling, as well as a subjective sensation of increased weakness in her right leg and a feeling that she is drooping or leaning to the right.  She states that the onset has been over the last several days to a few weeks.  I reviewed the past medical records in [place].  The patient was most recently admitted in January with abdominal pain and lactic acidosis.  On exam, the patient is chronically weak appearing but in no acute distress.  Her vital signs are normal except for hypertension.  There are no focal neurologic deficits on exam although she is somewhat weak in bilateral lower extremities.  Overall given the symmetrical and distal extremity nature of the sensory symptoms, the presentation is most consistent with peripheral neuropathy but since the patient reports some increased right-sided symptoms, differential also includes subacute stroke or less likely hemorrhage.  We will obtain CT head, lab work-up, and reassess.  ----------------------------------------- 9:44 AM on 01/01/2019 -----------------------------------------  CT head is negative, and the remainder of the lab work-up is all within normal limits.  The patient is stable for discharge back to her facility.  I counseled her on the results of the work-up.  Return precautions given verbally and provided in the discharge paperwork, and the patient expressed understanding.   ___________________________  Diana Hawkins was evaluated in Emergency Department on 01/01/2019 for the symptoms described in the history of present illness. She was evaluated in the context of the global COVID-19 pandemic, which necessitated consideration that the patient might be at risk for infection with the SARS-CoV-2 virus that causes COVID-19. Institutional protocols and algorithms that pertain to the evaluation of patients at risk for COVID-19 are in a state of rapid change based on information released by regulatory bodies including the CDC and federal and state organizations. These policies and algorithms were followed during the patient's care in the ED. ____________________________________________   FINAL CLINICAL IMPRESSION(S) / ED DIAGNOSES  Final diagnoses:  Paresthesia      NEW MEDICATIONS STARTED DURING THIS VISIT:  New Prescriptions   No medications on file     Note:  This document was prepared using Dragon voice recognition software and may include unintentional dictation errors.    Arta Silence, MD 01/01/19 [phone]

## 2019-01-01 NOTE — Discharge Instructions (Addendum)
Ms. Balcerzak was evaluated in the emergency department and her work-up shows no acute findings.  CT scan shows no evidence of a new or recent stroke, and her lab work-up was within normal limits.  Her COVID-19 test was NEGATIVE.   She should return to the emergency department for any new or worsening weakness, numbness, severe headache, speech difficulty, vision changes, fevers, or any other new or worsening symptoms.

## 2019-01-01 NOTE — ED Notes (Signed)
Patient transported to CT 

## 2019-01-01 NOTE — ED Notes (Signed)
Pt wet. Cleaned and new diaper placed.

## 2019-01-01 NOTE — ED Triage Notes (Signed)
From [facility]. Here for numbness to both hands and feet for few days. Denies pain.

## 2019-09-01 ENCOUNTER — Emergency Department: Payer: Medicare Other

## 2019-09-01 ENCOUNTER — Emergency Department
Admission: EM | Admit: 2019-09-01 | Discharge: 2019-09-01 | Disposition: A | Discharge status: Skilled Nursing Facility | Payer: Medicare Other | Source: Home / Self Care | Attending: Emergency Medicine | Admitting: Emergency Medicine

## 2019-09-01 ENCOUNTER — Other Ambulatory Visit: Payer: Self-pay

## 2019-09-01 DIAGNOSIS — R140 Abdominal distension (gaseous): Secondary | ICD-10-CM | POA: Diagnosis not present

## 2019-09-01 DIAGNOSIS — K5641 Fecal impaction: Secondary | ICD-10-CM

## 2019-09-01 DIAGNOSIS — I10 Essential (primary) hypertension: Secondary | ICD-10-CM | POA: Insufficient documentation

## 2019-09-01 DIAGNOSIS — R1084 Generalized abdominal pain: Secondary | ICD-10-CM | POA: Insufficient documentation

## 2019-09-01 DIAGNOSIS — K5900 Constipation, unspecified: Secondary | ICD-10-CM

## 2019-09-01 DIAGNOSIS — Z79899 Other long term (current) drug therapy: Secondary | ICD-10-CM | POA: Insufficient documentation

## 2019-09-01 DIAGNOSIS — K5939 Other megacolon: Secondary | ICD-10-CM | POA: Diagnosis not present

## 2019-09-01 LAB — CBC
HCT: 43.1 % (ref 36.0–46.0)
Hemoglobin: 13.7 g/dL (ref 12.0–15.0)
MCH: 28 pg (ref 26.0–34.0)
MCHC: 31.8 g/dL (ref 30.0–36.0)
MCV: 88 fL (ref 80.0–100.0)
Platelets: 292 10*3/uL (ref 150–400)
RBC: 4.9 MIL/uL (ref 3.87–5.11)
RDW: 14.9 % (ref 11.5–15.5)
WBC: 10.7 10*3/uL — ABNORMAL HIGH (ref 4.0–10.5)
nRBC: 0 % (ref 0.0–0.2)

## 2019-09-01 LAB — COMPREHENSIVE METABOLIC PANEL
ALT: 16 U/L (ref 0–44)
AST: 21 U/L (ref 15–41)
Albumin: 3.6 g/dL (ref 3.5–5.0)
Alkaline Phosphatase: 62 U/L (ref 38–126)
Anion gap: 6 (ref 5–15)
BUN: 26 mg/dL — ABNORMAL HIGH (ref 8–23)
CO2: 32 mmol/L (ref 22–32)
Calcium: 9.5 mg/dL (ref 8.9–10.3)
Chloride: 102 mmol/L (ref 98–111)
Creatinine, Ser: 0.86 mg/dL (ref 0.44–1.00)
GFR calc Af Amer: >60 mL/min (ref >60)
GFR calc non Af Amer: >60 mL/min (ref >60)
Glucose, Bld: 113 mg/dL — ABNORMAL HIGH (ref 70–99)
Potassium: 3.8 mmol/L (ref 3.5–5.1)
Sodium: 140 mmol/L (ref 135–145)
Total Bilirubin: 0.5 mg/dL (ref 0.3–1.2)
Total Protein: 6.7 g/dL (ref 6.5–8.1)

## 2019-09-01 MED ORDER — MINERAL OIL RE ENEM
1.0000 | ENEMA | Freq: Once | RECTAL | Status: AC
  Administered 2019-09-01: 1 via RECTAL

## 2019-09-01 MED ORDER — MAGNESIUM CITRATE PO SOLN
1.0000 | Freq: Once | ORAL | Status: AC
  Administered 2019-09-01: 1 via ORAL
  Filled 2019-09-01: qty 296

## 2019-09-01 MED ORDER — SODIUM CHLORIDE 0.9 % IV BOLUS
500.0000 mL | Freq: Once | INTRAVENOUS | Status: AC
  Administered 2019-09-01: 500 mL via INTRAVENOUS

## 2019-09-01 MED ORDER — DOCUSATE SODIUM 100 MG PO CAPS
100.0000 mg | ORAL_CAPSULE | Freq: Once | ORAL | Status: AC
  Administered 2019-09-01: 100 mg via ORAL
  Filled 2019-09-01: qty 1

## 2019-09-01 NOTE — ED Triage Notes (Signed)
PT to ED via EMS from [place]. C/O abd pain and distention. HX of same with relief from enema. No BM for "few days".

## 2019-09-01 NOTE — ED Provider Notes (Signed)
[facility] Emergency Department Provider Note  ____________________________________________   First MD Initiated Contact with Patient 09/01/19 0405     (approximate)  I have reviewed the triage vital signs and the nursing notes.   HISTORY  Chief Complaint Abdominal Pain and Constipation    HPI Diana Hawkins is a 86 y.o. female with below list of previous medical conditions including chronic constipation presents to the emergency department secondary to constipation times "few days".  Patient does admit to abdominal discomfort and distention.  Patient denies any nausea or vomiting.  Chart review revealed evidence of the same.  Patient does admit to previous episodes of constipation that resolved with enema.        Past Medical History:  Diagnosis Date  . Anemia 07/2008   extreme anemia  . Depressive disorder   . Hepatitis A 01/1976  . Hypertension   . Irregular heartbeat 02/2001   Irregular heartbeat checked  . Stroke [facility])     Patient Active Problem List   Diagnosis Date Noted  . Constipation 08/26/2018  . Abdominal pain 08/24/2018  . Cognitive deficit, post-stroke 10/04/2014  . Trochanteric bursitis of left hip 10/04/2014  . SAH (subarachnoid hemorrhage) ([place]) 09/26/2014  . Subarachnoid hemorrhage ([place]) 09/22/2014    Past Surgical History:  Procedure Laterality Date  . ABDOMINAL HYSTERECTOMY  01/1968   Complete   . BREAST SURGERY Right 11/1966   Benign tumor right breast removed  . CATARACT EXTRACTION W/ INTRAOCULAR LENS IMPLANT Bilateral 04/03 L eye; 06/03 R eye  . COLON SURGERY  1/10   Stage 3 Cancer  . IR GENERIC HISTORICAL  09/22/2014   IR ANGIO INTRA EXTRACRAN SEL COM CAROTID INNOMINATE BILAT MOD SED 09/22/2014 [name], MD [facility]-INTERV RAD  . IR GENERIC HISTORICAL  09/22/2014   IR ANGIO VERTEBRAL SEL VERTEBRAL UNI R MOD SED 09/22/2014 [name], MD [facility]INTER[facility]  . OOPHORECTOMY Right 08/1964   Right Ovary Removed,  benign tumor  . PANCREATIC CYST DRAINAGE  03/2012   Benign    Prior to Admission medications   Medication Sig Start Date End Date Taking? Authorizing Provider  acetaminophen (TYLENOL) 650 MG CR tablet Take 650 mg by mouth at bedtime.   Yes [provider]  Biotin 1 MG CAPS Take 500 mcg by mouth every evening.    Yes [provider]  Dermatological Products, Misc. (MOISTURE BARRIER) OINT Apply 1 application topically See admin instructions. Apply to sacrum topically every shift   Yes [provider]  fluticasone (FLONASE) 50 MCG/ACT nasal spray Place 1 spray into both nostrils 2 (two) times daily.   Yes [provider]  furosemide (LASIX) 20 MG tablet Take 10 mg by mouth daily.   Yes [provider]  gabapentin (NEURONTIN) 100 MG capsule Take 1 capsule (100 mg total) by mouth 3 (three) times daily. Patient taking differently: Take 200 mg by mouth at bedtime.  04/29/15  Yes Mesner, Corene Cornea, MD  Lactase (LACTAID FAST ACT) 9000 units TABS Take 18,000 Units by mouth 2 (two) times daily.   Yes [provider]  mirtazapine (REMERON) 15 MG tablet Take 15 mg by mouth at bedtime.   Yes [provider]  Multiple Vitamin (DAILY VITE) TABS Take 1 tablet by mouth daily.   Yes [provider]  [place] (OYSTER CALCIUM) 500 MG TABS tablet Take 1,000 mg of elemental calcium by mouth daily.   Yes [provider]  potassium chloride (K-DUR,KLOR-CON) 10 MEQ tablet Take 10 mEq  by mouth daily.   Yes [provider]  Probiotic Product (RISA-BID PROBIOTIC) TABS Take 1 tablet by mouth every evening.   Yes [provider]  simethicone (MYLICON) 80 MG chewable tablet Chew 80 mg by mouth 2 (two) times daily. Before lunch and dinner.   Yes [provider]  solifenacin (VESICARE) 5 MG tablet Take 5 mg by mouth daily.   Yes [provider]  carboxymethylcellulose (REFRESH PLUS) 0.5 % SOLN Place 1 drop into  both eyes 3 (three) times daily as needed (for dry eyes).    [provider]  colestipol (COLESTID) 5 g packet Take 5 g by mouth every 12 (twelve) hours as needed (for diarrhea).    [provider]  ondansetron (ZOFRAN-ODT) 8 MG disintegrating tablet Take 8 mg by mouth 3 (three) times daily as needed for nausea or vomiting.    [provider]  Psyllium (METAMUCIL) 28.3 % POWD Take 20 mLs by mouth as needed (for constipation).    [provider]  senna-docusate (SENOKOT-S) 8.6-50 MG tablet Take 2 tablets by mouth 2 (two) times daily. 07/06/18   Carrie Mew, MD    Allergies Codeine and Reglan [metoclopramide]  History reviewed. No pertinent family history.  Social History Social History   Tobacco Use  . Smoking status: Never Smoker  . Smokeless tobacco: Never Used  Substance Use Topics  . Alcohol use: No    Alcohol/week: 0.0 standard drinks  . Drug use: No    Review of Systems Constitutional: No fever/chills Eyes: No visual changes. ENT: No sore throat. Cardiovascular: Denies chest pain. Respiratory: Denies shortness of breath. Gastrointestinal: Positive for abdominal distention discomfort and constipation Genitourinary: Negative for dysuria. Musculoskeletal: Negative for neck pain.  Negative for back pain. Integumentary: Negative for rash. Neurological: Negative for headaches, focal weakness or numbness. _________________   PHYSICAL EXAM:  VITAL SIGNS: ED Triage Vitals  Enc Vitals Group     BP 09/01/19 0347 (!) 146/81     Pulse Rate 09/01/19 0347 82     Resp 09/01/19 0347 (!) 9     Temp 09/01/19 0347 98.1 F (36.7 C)     Temp src --      SpO2 09/01/19 0347 100 %     Weight 09/01/19 0348 55.8 kg (123 lb)     Height 09/01/19 0348 1.651 m (5\' 5" )     Head Circumference --      Peak Flow --      Pain Score 09/01/19 0348 6     Pain Loc --      Pain Edu? --      Excl. in [place]? --    Constitutional: Alert and oriented.  Eyes:  Conjunctivae are normal.  Mouth/Throat: Patient is wearing a mask. Neck: No stridor.  No meningeal signs.   Cardiovascular: Normal rate, regular rhythm. Good peripheral circulation. Grossly normal heart sounds. Respiratory: Normal respiratory effort.  No retractions. Gastrointestinal: Soft and nontender.  Abdominal distention with tympanic sounds with percussion.  Musculoskeletal: No lower extremity tenderness nor edema. No gross deformities of extremities. Neurologic:  Normal speech and language. No gross focal neurologic deficits are appreciated.  Skin:  Skin is warm, dry and intact. Psychiatric: Mood and affect are normal. Speech and behavior are normal.  ____________________________________________   LABS (all labs ordered are listed, but only abnormal results are displayed)  Labs Reviewed  CBC - Abnormal; Notable for the following components:      Result Value   WBC 10.7 (*)  All other components within normal limits  COMPREHENSIVE METABOLIC PANEL - Abnormal; Notable for the following components:   Glucose, Bld 113 (*)    BUN 26 (*)    All other components within normal limits   ________ RADIOLOGY I, [place] N Reyes [place], personally viewed and evaluated these images (plain radiographs) as part of my medical decision making, as well as reviewing the written report by the radiologist.   Official radiology report(s): DG Abdomen 1 View  Result Date: 09/01/2019 CLINICAL DATA:  Abdominal pain and distension EXAM: ABDOMEN - 1 VIEW COMPARISON:  Radiograph 08/26/2018, CT 08/24/2018 FINDINGS: Persistent extreme gaseous distension of the rectum with large stool ball projecting over the rectal vault. There is increasing proximal colonic and small bowel gaseous distention when compared to prior study. Numerous surgical clips project over the right hemiabdomen. No acute osseous abnormality. Lung bases are unremarkable. IMPRESSION: Increasing gaseous distention of the colon and now small bowel  with large rectal stool ball. Electronically Signed   By: Lovena Le M.D.   On: 09/01/2019 04:29    ____________________________________________   PROCEDURES       Procedures   ____________________________________________   INITIAL IMPRESSION / MDM / [place] / ED COURSE  As part of my medical decision making, I reviewed the following data within the [place] NUMBER  86 year old female presented with above-stated history and physical exam consistent with constipation which was confirmed on x-ray.  Patient manually disimpacted by me.  Following doing so patient received an enema with large volume defecation following.     ____________________________________________  FINAL CLINICAL IMPRESSION(S) / ED DIAGNOSES  Final diagnoses:  Constipation, unspecified constipation type  Fecal impaction in rectum (HCC)     MEDICATIONS GIVEN DURING THIS VISIT:  Medications  docusate sodium (COLACE) capsule 100 mg (has no administration in time range)  magnesium citrate solution 1 Bottle (has no administration in time range)  mineral oil enema 1 enema (has no administration in time range)  sodium chloride 0.9 % bolus 500 mL (500 mLs Intravenous New Bag/Given 09/01/19 0428)     ED Discharge Orders    None      *Please note:  Diana Hawkins was evaluated in Emergency Department on 09/01/2019 for the symptoms described in the history of present illness. She was evaluated in the context of the global COVID-19 pandemic, which necessitated consideration that the patient might be at risk for infection with the SARS-CoV-2 virus that causes COVID-19. Institutional protocols and algorithms that pertain to the evaluation of patients at risk for COVID-19 are in a state of rapid change based on information released by regulatory bodies including the CDC and federal and state organizations. These policies and algorithms were followed during the patient's care in the ED.  Some  ED evaluations and interventions may be delayed as a result of limited staffing during the pandemic.*  Note:  This document was prepared using Dragon voice recognition software and may include unintentional dictation errors.   Gregor Hams, MD 09/01/19 [phone]

## 2019-09-01 NOTE — ED Notes (Signed)
PT passed a small amount of soft stool and moderate amount gas. PT cleaned up and new sacral pad applied.

## 2019-09-01 NOTE — ED Notes (Signed)
PT to toilet post enema. PT has passed large amount of gas.

## 2019-09-02 ENCOUNTER — Emergency Department: Payer: Medicare Other

## 2019-09-02 ENCOUNTER — Observation Stay: Payer: Medicare Other

## 2019-09-02 ENCOUNTER — Inpatient Hospital Stay
Admission: EM | Admit: 2019-09-02 | Discharge: 2019-10-03 | DRG: 393 | Disposition: E | Payer: Medicare Other | Source: Skilled Nursing Facility | Attending: Internal Medicine | Admitting: Internal Medicine

## 2019-09-02 ENCOUNTER — Other Ambulatory Visit: Payer: Self-pay

## 2019-09-02 DIAGNOSIS — R112 Nausea with vomiting, unspecified: Secondary | ICD-10-CM | POA: Diagnosis not present

## 2019-09-02 DIAGNOSIS — Z20822 Contact with and (suspected) exposure to covid-19: Secondary | ICD-10-CM | POA: Diagnosis present

## 2019-09-02 DIAGNOSIS — Z515 Encounter for palliative care: Secondary | ICD-10-CM | POA: Diagnosis not present

## 2019-09-02 DIAGNOSIS — K529 Noninfective gastroenteritis and colitis, unspecified: Secondary | ICD-10-CM

## 2019-09-02 DIAGNOSIS — J189 Pneumonia, unspecified organism: Secondary | ICD-10-CM | POA: Diagnosis not present

## 2019-09-02 DIAGNOSIS — D72829 Elevated white blood cell count, unspecified: Secondary | ICD-10-CM | POA: Diagnosis present

## 2019-09-02 DIAGNOSIS — Z8673 Personal history of transient ischemic attack (TIA), and cerebral infarction without residual deficits: Secondary | ICD-10-CM

## 2019-09-02 DIAGNOSIS — K5900 Constipation, unspecified: Secondary | ICD-10-CM | POA: Diagnosis present

## 2019-09-02 DIAGNOSIS — F0390 Unspecified dementia without behavioral disturbance: Secondary | ICD-10-CM | POA: Diagnosis present

## 2019-09-02 DIAGNOSIS — K5939 Other megacolon: Secondary | ICD-10-CM | POA: Diagnosis present

## 2019-09-02 DIAGNOSIS — E872 Acidosis: Secondary | ICD-10-CM | POA: Diagnosis not present

## 2019-09-02 DIAGNOSIS — J9601 Acute respiratory failure with hypoxia: Secondary | ICD-10-CM | POA: Diagnosis not present

## 2019-09-02 DIAGNOSIS — Z9049 Acquired absence of other specified parts of digestive tract: Secondary | ICD-10-CM

## 2019-09-02 DIAGNOSIS — R140 Abdominal distension (gaseous): Secondary | ICD-10-CM | POA: Diagnosis present

## 2019-09-02 DIAGNOSIS — Z85038 Personal history of other malignant neoplasm of large intestine: Secondary | ICD-10-CM

## 2019-09-02 DIAGNOSIS — F329 Major depressive disorder, single episode, unspecified: Secondary | ICD-10-CM | POA: Diagnosis present

## 2019-09-02 DIAGNOSIS — K5641 Fecal impaction: Secondary | ICD-10-CM | POA: Diagnosis present

## 2019-09-02 DIAGNOSIS — Z79899 Other long term (current) drug therapy: Secondary | ICD-10-CM | POA: Diagnosis not present

## 2019-09-02 DIAGNOSIS — R0602 Shortness of breath: Secondary | ICD-10-CM

## 2019-09-02 DIAGNOSIS — I10 Essential (primary) hypertension: Secondary | ICD-10-CM | POA: Diagnosis present

## 2019-09-02 DIAGNOSIS — Z66 Do not resuscitate: Secondary | ICD-10-CM | POA: Diagnosis present

## 2019-09-02 DIAGNOSIS — R0902 Hypoxemia: Secondary | ICD-10-CM

## 2019-09-02 LAB — COMPREHENSIVE METABOLIC PANEL
ALT: 17 U/L (ref 0–44)
AST: 22 U/L (ref 15–41)
Albumin: 4.1 g/dL (ref 3.5–5.0)
Alkaline Phosphatase: 79 U/L (ref 38–126)
Anion gap: 11 (ref 5–15)
BUN: 29 mg/dL — ABNORMAL HIGH (ref 8–23)
CO2: 31 mmol/L (ref 22–32)
Calcium: 9.8 mg/dL (ref 8.9–10.3)
Chloride: 101 mmol/L (ref 98–111)
Creatinine, Ser: 0.97 mg/dL (ref 0.44–1.00)
GFR calc Af Amer: >60 mL/min (ref >60)
GFR calc non Af Amer: 53 mL/min — ABNORMAL LOW (ref >60)
Glucose, Bld: 131 mg/dL — ABNORMAL HIGH (ref 70–99)
Potassium: 3.8 mmol/L (ref 3.5–5.1)
Sodium: 143 mmol/L (ref 135–145)
Total Bilirubin: 0.8 mg/dL (ref 0.3–1.2)
Total Protein: 7.6 g/dL (ref 6.5–8.1)

## 2019-09-02 LAB — CBC WITH DIFFERENTIAL/PLATELET
Abs Immature Granulocytes: 0.06 10*3/uL (ref 0.00–0.07)
Basophils Absolute: 0.1 10*3/uL (ref 0.0–0.1)
Basophils Relative: 0 %
Eosinophils Absolute: 0 10*3/uL (ref 0.0–0.5)
Eosinophils Relative: 0 %
HCT: 45.6 % (ref 36.0–46.0)
Hemoglobin: 14.5 g/dL (ref 12.0–15.0)
Immature Granulocytes: 0 %
Lymphocytes Relative: 8 %
Lymphs Abs: 1.1 10*3/uL (ref 0.7–4.0)
MCH: 27.7 pg (ref 26.0–34.0)
MCHC: 31.8 g/dL (ref 30.0–36.0)
MCV: 87.2 fL (ref 80.0–100.0)
Monocytes Absolute: 0.5 10*3/uL (ref 0.1–1.0)
Monocytes Relative: 4 %
Neutro Abs: 12.6 10*3/uL — ABNORMAL HIGH (ref 1.7–7.7)
Neutrophils Relative %: 88 %
Platelets: 295 10*3/uL (ref 150–400)
RBC: 5.23 MIL/uL — ABNORMAL HIGH (ref 3.87–5.11)
RDW: 14.8 % (ref 11.5–15.5)
WBC: 14.4 10*3/uL — ABNORMAL HIGH (ref 4.0–10.5)
nRBC: 0 % (ref 0.0–0.2)

## 2019-09-02 LAB — PROCALCITONIN: Procalcitonin: <0.1 ng/mL

## 2019-09-02 LAB — SARS CORONAVIRUS 2 (TAT 6-24 HRS): SARS Coronavirus 2: NEGATIVE

## 2019-09-02 LAB — LACTIC ACID, PLASMA: Lactic Acid, Venous: 1.3 mmol/L (ref 0.5–1.9)

## 2019-09-02 LAB — TSH: TSH: 1.487 u[IU]/mL (ref 0.350–4.500)

## 2019-09-02 LAB — MAGNESIUM: Magnesium: 2.4 mg/dL (ref 1.7–2.4)

## 2019-09-02 MED ORDER — POTASSIUM CHLORIDE CRYS ER 10 MEQ PO TBCR
10.0000 meq | EXTENDED_RELEASE_TABLET | Freq: Every day | ORAL | Status: DC
  Administered 2019-09-03 – 2019-09-04 (×2): 10 meq via ORAL
  Filled 2019-09-02 (×3): qty 1

## 2019-09-02 MED ORDER — ADULT MULTIVITAMIN W/MINERALS CH
1.0000 | ORAL_TABLET | Freq: Every day | ORAL | Status: DC
  Administered 2019-09-03 – 2019-09-04 (×2): 1 via ORAL
  Filled 2019-09-02 (×3): qty 1

## 2019-09-02 MED ORDER — TRAZODONE HCL 50 MG PO TABS: 25.0000 mg | ORAL_TABLET | Freq: Every evening | ORAL | Status: DC | PRN

## 2019-09-02 MED ORDER — ACETAMINOPHEN 325 MG PO TABS: 650.0000 mg | ORAL_TABLET | Freq: Four times a day (QID) | ORAL | Status: DC | PRN

## 2019-09-02 MED ORDER — ONDANSETRON HCL 4 MG/2ML IJ SOLN: 4.0000 mg | Freq: Three times a day (TID) | INTRAMUSCULAR | Status: DC

## 2019-09-02 MED ORDER — DARIFENACIN HYDROBROMIDE ER 7.5 MG PO TB24
7.5000 mg | ORAL_TABLET | Freq: Every day | ORAL | Status: DC
  Filled 2019-09-02: qty 1

## 2019-09-02 MED ORDER — IOHEXOL 300 MG/ML  SOLN
100.0000 mL | Freq: Once | INTRAMUSCULAR | Status: AC | PRN
  Administered 2019-09-02: 100 mL via INTRAVENOUS

## 2019-09-02 MED ORDER — SIMETHICONE 80 MG PO CHEW
80.0000 mg | CHEWABLE_TABLET | Freq: Two times a day (BID) | ORAL | Status: DC
  Administered 2019-09-02 – 2019-09-04 (×6): 80 mg via ORAL
  Filled 2019-09-02 (×7): qty 1

## 2019-09-02 MED ORDER — ONDANSETRON HCL 4 MG/2ML IJ SOLN
4.0000 mg | INTRAMUSCULAR | Status: AC
  Administered 2019-09-02: 4 mg via INTRAVENOUS
  Filled 2019-09-02: qty 2

## 2019-09-02 MED ORDER — ONDANSETRON HCL 4 MG/2ML IJ SOLN: 4.0000 mg | Freq: Four times a day (QID) | INTRAMUSCULAR | Status: DC | PRN

## 2019-09-02 MED ORDER — ONDANSETRON HCL 4 MG PO TABS: 4.0000 mg | ORAL_TABLET | Freq: Four times a day (QID) | ORAL | Status: DC | PRN

## 2019-09-02 MED ORDER — ENOXAPARIN SODIUM 40 MG/0.4ML ~~LOC~~ SOLN
40.0000 mg | SUBCUTANEOUS | Status: DC
  Administered 2019-09-03 – 2019-09-04 (×2): 40 mg via SUBCUTANEOUS
  Filled 2019-09-02 (×2): qty 0.4

## 2019-09-02 MED ORDER — IOHEXOL 9 MG/ML PO SOLN
500.0000 mL | Freq: Once | ORAL | Status: DC | PRN
  Administered 2019-09-02: 02:00:00 500 mL via ORAL

## 2019-09-02 MED ORDER — FLEET ENEMA 7-19 GM/118ML RE ENEM: 1.0000 | ENEMA | Freq: Every day | RECTAL | Status: DC | PRN

## 2019-09-02 MED ORDER — POLYETHYLENE GLYCOL 3350 17 G PO PACK: 17.0000 g | PACK | Freq: Every day | ORAL | Status: DC

## 2019-09-02 MED ORDER — DOCUSATE SODIUM 100 MG PO CAPS: 100.0000 mg | ORAL_CAPSULE | Freq: Two times a day (BID) | ORAL | Status: DC | PRN

## 2019-09-02 MED ORDER — MAGNESIUM HYDROXIDE 400 MG/5ML PO SUSP: 30.0000 mL | Freq: Every day | ORAL | Status: DC | PRN

## 2019-09-02 MED ORDER — LACTASE 3000 UNITS PO TABS
18000.0000 [IU] | ORAL_TABLET | Freq: Two times a day (BID) | ORAL | Status: DC
  Administered 2019-09-02 – 2019-09-04 (×5): 18000 [IU] via ORAL
  Filled 2019-09-02 (×7): qty 6

## 2019-09-02 MED ORDER — ACETAMINOPHEN 650 MG RE SUPP: 650.0000 mg | Freq: Four times a day (QID) | RECTAL | Status: DC | PRN

## 2019-09-02 MED ORDER — LACTULOSE ENEMA
300.0000 mL | ORAL | Status: AC
  Administered 2019-09-02: 300 mL via RECTAL
  Filled 2019-09-02 (×2): qty 300

## 2019-09-02 MED ORDER — FLUTICASONE PROPIONATE 50 MCG/ACT NA SUSP
1.0000 | Freq: Two times a day (BID) | NASAL | Status: DC
  Administered 2019-09-03 – 2019-09-04 (×4): 1 via NASAL
  Filled 2019-09-02 (×2): qty 16

## 2019-09-02 MED ORDER — GABAPENTIN 100 MG PO CAPS
200.0000 mg | ORAL_CAPSULE | Freq: Every day | ORAL | Status: DC
  Administered 2019-09-02 – 2019-09-04 (×3): 200 mg via ORAL
  Filled 2019-09-02 (×3): qty 2

## 2019-09-02 MED ORDER — LACTULOSE 10 GM/15ML PO SOLN
30.0000 g | Freq: Three times a day (TID) | ORAL | Status: DC
  Administered 2019-09-02 – 2019-09-04 (×9): 30 g via ORAL
  Filled 2019-09-02 (×9): qty 60

## 2019-09-02 MED ORDER — MAGNESIUM CITRATE PO SOLN
1.0000 | Freq: Once | ORAL | Status: DC
  Filled 2019-09-02: qty 296

## 2019-09-02 MED ORDER — SODIUM CHLORIDE 0.9 % IV SOLN: INTRAVENOUS | Status: DC

## 2019-09-02 MED ORDER — PSYLLIUM 95 % PO PACK
1.0000 | PACK | Freq: Every day | ORAL | Status: DC
  Administered 2019-09-02 – 2019-09-04 (×3): 1 via ORAL
  Filled 2019-09-02 (×5): qty 1

## 2019-09-02 MED ORDER — FUROSEMIDE 20 MG PO TABS: 10.0000 mg | ORAL_TABLET | Freq: Every day | ORAL | Status: DC

## 2019-09-02 MED ORDER — DROPERIDOL 2.5 MG/ML IJ SOLN
1.2500 mg | Freq: Once | INTRAMUSCULAR | Status: AC
  Administered 2019-09-02: 1.25 mg via INTRAVENOUS
  Filled 2019-09-02: qty 2

## 2019-09-02 MED ORDER — LIDOCAINE VISCOUS HCL 2 % MT SOLN
15.0000 mL | Freq: Once | OROMUCOSAL | Status: AC
  Administered 2019-09-02: 15 mL via OROMUCOSAL
  Filled 2019-09-02: qty 15

## 2019-09-02 MED ORDER — POLYETHYLENE GLYCOL 3350 17 G PO PACK
17.0000 g | PACK | Freq: Every day | ORAL | Status: DC
  Administered 2019-09-02 – 2019-09-04 (×3): 17 g via ORAL
  Filled 2019-09-02 (×3): qty 1

## 2019-09-02 MED ORDER — COLCHICINE 0.6 MG PO TABS
0.6000 mg | ORAL_TABLET | Freq: Two times a day (BID) | ORAL | Status: DC
  Administered 2019-09-02 – 2019-09-04 (×5): 0.6 mg via ORAL
  Filled 2019-09-02 (×7): qty 1

## 2019-09-02 MED ORDER — BIOTIN 1 MG PO CAPS: 500.0000 ug | ORAL_CAPSULE | Freq: Every evening | ORAL | Status: DC

## 2019-09-02 MED ORDER — RISAQUAD PO CAPS
1.0000 | ORAL_CAPSULE | Freq: Every evening | ORAL | Status: DC
  Administered 2019-09-02 – 2019-09-04 (×3): 1 via ORAL
  Filled 2019-09-02 (×3): qty 1

## 2019-09-02 MED ORDER — MIRTAZAPINE 15 MG PO TABS
15.0000 mg | ORAL_TABLET | Freq: Every day | ORAL | Status: DC
  Administered 2019-09-02 – 2019-09-04 (×3): 15 mg via ORAL
  Filled 2019-09-02 (×3): qty 1

## 2019-09-02 MED ORDER — ZINC OXIDE 40 % EX OINT
1.0000 "application " | TOPICAL_OINTMENT | Freq: Three times a day (TID) | CUTANEOUS | Status: DC
  Administered 2019-09-02 – 2019-09-04 (×7): 1 via TOPICAL
  Filled 2019-09-02: qty 113

## 2019-09-02 NOTE — H&P (Signed)
[facility] NAME: Diana Hawkins    MR#:  [id number]  DATE OF BIRTH:  1931-08-21  DATE OF ADMISSION:  08/16/2019  PRIMARY CARE PHYSICIAN: Gala Romney, MD   REQUESTING/REFERRING PHYSICIAN: Hinda Kehr, MD  CHIEF COMPLAINT:   Chief Complaint  Patient presents with  . Constipation    HISTORY OF PRESENT ILLNESS:  Diana Hawkins  is a 86 y.o. pleasant Caucasian female with a known history of hypertension, anemia, depression and CVA, presented to the emergency room with acute onset of persistent constipation with obstipation for the last few days.  She had associated vomiting yesterday.  She came to the ER from her assisted living facility yesterday and was managed for constipation abdominal distention with disimpaction and enema and that was her last bowel movement.  She continued to have persistent constipation after that with inability to take p.o. in addition to vomiting IV contrast that was given in the ER.  She denies any chest pain or dyspnea or cough or wheezing.  No COVID-19 exposure.  No dysuria, oliguria or hematuria or flank pain.  She denied any fever or chills.  Upon presentation to the emergency room, blood pressure was 146/81 with respiratory rate of 9 and later 18 with otherwise normal vital signs.  Labs revealed leukocytosis of 14.4 with neutrophilia with unremarkable CMP except for mild azotemia of 29 with a creatinine of 0.97.  Procalcitonin was less than 0.1 and lactic acid 1.3. EKG showed sinus tachycardia with a rate of 112 with PACs left anterior fascicular block.  Abdominal pelvic CT scan revealed: 1. Massively distended colon and rectum with large amount of inspissated stool and circumferential mural thickening with adjacent fat stranding. Findings are concerning for impaction with stercoral colitis. No evidence of pneumatosis at this time though hyperemia of the bowel wall which could be inflammatory or suggest early  features of pressure necrosis given the degree of distention. Urgent surgical consultation is warranted. 2. Circumferential thickening of the distal esophagus with adjacent stranding and mucosal hyperemia may reflect esophagitis. Correlate with patient's symptoms.  Dr. Peyton Najjar was contacted about the patient and reviewing her previous study about a year ago he thought hyperemic bowel changes are not much different.  The patient was then given lactulose enema in the ER with an attempt at disimpaction however stools were soft.  She continued to have vomiting and had an NG tube placed with subsequent 400-500 mL of brownish aspirate.  She will be admitted to a medical monitored bed for further evaluation and management  PAST MEDICAL HISTORY:   Past Medical History:  Diagnosis Date  . Anemia 07/2008   extreme anemia  . Depressive disorder   . Hepatitis A 01/1976  . Hypertension   . Irregular heartbeat 02/2001   Irregular heartbeat checked  . Stroke [facility])     PAST SURGICAL HISTORY:   Past Surgical History:  Procedure Laterality Date  . ABDOMINAL HYSTERECTOMY  01/1968   Complete   . BREAST SURGERY Right 11/1966   Benign tumor right breast removed  . CATARACT EXTRACTION W/ INTRAOCULAR LENS IMPLANT Bilateral 04/03 L eye; 06/03 R eye  . COLON SURGERY  1/10   Stage 3 Cancer  . IR GENERIC HISTORICAL  09/22/2014   IR ANGIO INTRA EXTRACRAN SEL COM CAROTID INNOMINATE BILAT MOD SED 09/22/2014 [name], MD [facility]INTER[facility]  . IR GENERIC HISTORICAL  09/22/2014   IR ANGIO VERTEBRAL SEL VERTEBRAL UNI R MOD SED 09/22/2014 [name],  MD [facility]  . OOPHORECTOMY Right 08/1964   Right Ovary Removed, benign tumor  . PANCREATIC CYST DRAINAGE  03/2012   Benign    SOCIAL HISTORY:   Social History   Tobacco Use  . Smoking status: Never Smoker  . Smokeless tobacco: Never Used  Substance Use Topics  . Alcohol use: No    Alcohol/week: 0.0 standard drinks    FAMILY HISTORY:  No  family history on file.  DRUG ALLERGIES:   Allergies  Allergen Reactions  . Codeine Nausea And Vomiting and Other (See Comments)    "feels like I want to climb the walls" and dizziness  . Reglan [Metoclopramide] Other (See Comments)    Stomach cramps    REVIEW OF SYSTEMS:   ROS As per history of present illness. All pertinent systems were reviewed above. Constitutional,  HEENT, cardiovascular, respiratory, GI, GU, musculoskeletal, neuro, psychiatric, endocrine,  integumentary and hematologic systems were reviewed and are otherwise  negative/unremarkable except for positive findings mentioned above in the HPI.   MEDICATIONS AT HOME:   Prior to Admission medications   Medication Sig Start Date End Date Taking? Authorizing Provider  acetaminophen (TYLENOL) 650 MG CR tablet Take 650 mg by mouth at bedtime.   Yes [provider]  Biotin 1 MG CAPS Take 500 mcg by mouth every evening.    Yes [provider]  Dermatological Products, Misc. (MOISTURE BARRIER) OINT Apply 1 application topically 3 (three) times daily. Apply to sacrum topically every shift    Yes [provider]  fluticasone (FLONASE) 50 MCG/ACT nasal spray Place 1 spray into both nostrils 2 (two) times daily.   Yes [provider]  furosemide (LASIX) 20 MG tablet Take 10 mg by mouth daily.   Yes [provider]  gabapentin (NEURONTIN) 100 MG capsule Take 1 capsule (100 mg total) by mouth 3 (three) times daily. Patient taking differently: Take 200 mg by mouth at bedtime.  04/29/15  Yes Mesner, Corene Cornea, MD  Lactase (LACTAID FAST ACT) 9000 units TABS Take 18,000 Units by mouth 2 (two) times daily.   Yes [provider]  loperamide (IMODIUM A-D) 2 MG tablet Take 2 mg by mouth 4 (four) times daily as needed for diarrhea or loose stools.   Yes [provider]  mirtazapine (REMERON) 15 MG tablet Take 15 mg by mouth at bedtime.   Yes [provider]  Multiple  Vitamin (DAILY VITE) TABS Take 1 tablet by mouth daily.   Yes [provider]  [place] (OYSTER CALCIUM) 500 MG TABS tablet Take 1,000 mg of elemental calcium by mouth daily.   Yes [provider]  polyethylene glycol (MIRALAX / GLYCOLAX) 17 g packet Take 17 g by mouth daily as needed.   Yes [provider]  potassium chloride (K-DUR,KLOR-CON) 10 MEQ tablet Take 10 mEq by mouth daily.   Yes [provider]  Probiotic Product (RISA-BID PROBIOTIC) TABS Take 1 tablet by mouth every evening.   Yes [provider]  simethicone (MYLICON) 80 MG chewable tablet Chew 80 mg by mouth 2 (two) times daily. Before lunch and dinner.   Yes [provider]  solifenacin (VESICARE) 5 MG tablet Take 5 mg by mouth daily.   Yes [provider]      VITAL SIGNS:  Blood pressure (!) 146/79, pulse 66, temperature 98.3 F (36.8 C), temperature source Oral, resp. rate 15, height 5\' 5"  (1.651 m), weight 55.8 kg, SpO2 99 %.  PHYSICAL EXAMINATION:  Physical  Exam  GENERAL:  86 y.o.-year-old pleasant Caucasian female patient lying in the bed with no acute distress.  EYES: Pupils equal, round, reactive to light and accommodation. No scleral icterus. Extraocular muscles intact.  HEENT: Head atraumatic, normocephalic. Oropharynx and nasopharynx clear.  NECK:  Supple, no jugular venous distention. No thyroid enlargement, no tenderness.  LUNGS: Normal breath sounds bilaterally, no wheezing, rales,rhonchi or crepitation. No use of accessory muscles of respiration.  CARDIOVASCULAR: Regular rate and rhythm, S1, S2 normal. No murmurs, rubs, or gallops.  ABDOMEN: Soft distended and mildly tender diffusely with significant diminished bowel sounds.  No organomegaly or mass.  EXTREMITIES: No pedal edema, cyanosis, or clubbing.  NEUROLOGIC: Cranial nerves II through XII are intact. Muscle strength 5/5 in all extremities. Sensation intact. Gait not checked.  PSYCHIATRIC:  The patient is alert and oriented x 3.  Normal affect and good eye contact. SKIN: No obvious rash, lesion, or ulcer.   LABORATORY PANEL:   CBC Recent Labs  Lab 08/31/2019 0201  WBC 14.4*  HGB 14.5  HCT 45.6  PLT 295   ------------------------------------------------------------------------------------------------------------------  Chemistries  Recent Labs  Lab 08/30/2019 0201  NA 143  K 3.8  CL 101  CO2 31  GLUCOSE 131*  BUN 29*  CREATININE 0.97  CALCIUM 9.8  AST 22  ALT 17  ALKPHOS 79  BILITOT 0.8   ------------------------------------------------------------------------------------------------------------------  Cardiac Enzymes No results for input(s): TROPONINI in the last 168 hours. ------------------------------------------------------------------------------------------------------------------  RADIOLOGY:  DG Abdomen 1 View  Result Date: 09/01/2019 CLINICAL DATA:  Abdominal pain and distension EXAM: ABDOMEN - 1 VIEW COMPARISON:  Radiograph 08/26/2018, CT 08/24/2018 FINDINGS: Persistent extreme gaseous distension of the rectum with large stool ball projecting over the rectal vault. There is increasing proximal colonic and small bowel gaseous distention when compared to prior study. Numerous surgical clips project over the right hemiabdomen. No acute osseous abnormality. Lung bases are unremarkable. IMPRESSION: Increasing gaseous distention of the colon and now small bowel with large rectal stool ball. Electronically Signed   By: Lovena Le M.D.   On: 09/01/2019 04:29   CT ABDOMEN PELVIS W CONTRAST  Result Date: 08/23/2019 CLINICAL DATA:  Bowel obstruction suspected, constipation and distention EXAM: CT ABDOMEN AND PELVIS WITH CONTRAST TECHNIQUE: Multidetector CT imaging of the abdomen and pelvis was performed using the standard protocol following bolus administration of intravenous contrast. CONTRAST:  11mL OMNIPAQUE IOHEXOL 300 MG/ML  SOLN COMPARISON:  CT abdomen  pelvis 08/24/2018, abdominal radiograph 09/01/2019 FINDINGS: Lower chest: Air cyst seen in the left lung base. Basilar atelectatic changes present as well. Mild cardiomegaly with slight mass effect on the right heart by a severe pectus deformity of the chest (Haller index of 4.1). Hepatobiliary: There is a bilobed cyst in the left lobe liver measuring approximately 1.4 cm in size which is unchanged from comparison study (2/14). There is mild intra and extrahepatic biliary ductal dilatation which may be related to senescent change in the absence of visualized gallstones in either the gallbladder or biliary tree. No pericholecystic inflammation is seen. Pancreas: Limited visualization due to marked compression by the massively distended colon and rectum. Spleen: Normal in size without focal abnormality. Adrenals/Urinary Tract: Nodular thickening of the adrenal glands may reflect some mild senescent hyperplasia without dominant or concerning adrenal lesion. 1.7 cm fluid attenuation cyst is present in the inferior pole right kidney, unchanged from prior. Additional subcentimeter hypoattenuating foci in both kidneys are too small to fully characterize on CT imaging but statistically likely benign. No obstructive urolithiasis  or hydronephrosis. Bladder is significantly indented posteriorly by the massively distended colon and rectum. Stomach/Bowel: Distal esophagus is circumferentially thickened with adjacent stranding and mucosal hyperemia. The stomach is markedly distended with ingested material. Much of the small bowel is compressed by the massively enlarged stool filled colon. There are postsurgical changes in the right hemiabdomen likely reflecting prior right hemicolectomy and ileocolic anastomosis. The proximal colonic segments are largely air distended with minimal formed stool. The sigmoid and rectum are massively distended, the rectum measuring up to 14 cm in maximal diameter with large amount of inspissated  stool and circumferential mural thickening with adjacent Peri colonic and perirectal fat stranding. No evidence of pneumatosis at this time. Some hyperdensity within the mural thickening of the colon and rectum could reflect early venous engorgement. Vascular/Lymphatic: Atherosclerotic plaque within the normal caliber aorta. Reproductive: Uterus is surgically absent. No concerning adnexal lesions. No visible adnexal lesions though the low pelvis is distorted by marked colonic distention. Other: Some reactive free fluid is noted in the pelvis with extensive stranding about the colon and rectum. No free air. Mild circumferential body wall edema. Musculoskeletal: The osseous structures appear diffusely demineralized which may limit detection of small or nondisplaced fractures. Multilevel degenerative changes are present in the imaged portions of the spine. Extensive fatty marrow changes noted throughout the pelvic girdle and proximal femora. Sclerotic changes of the femoral heads the hand IMPRESSION: 1. Massively distended colon and rectum with large amount of inspissated stool and circumferential mural thickening with adjacent fat stranding. Findings are concerning for impaction with stercoral colitis. No evidence of pneumatosis at this time though hyperemia of the bowel wall which could be inflammatory or suggest early features of pressure necrosis given the degree of distention. Urgent surgical consultation is warranted. 2. Circumferential thickening of the distal esophagus with adjacent stranding and mucosal hyperemia may reflect esophagitis. Correlate with patient's symptoms. Critical Value/emergent results were called by telephone at the time of interpretation on 08/25/2019 at 3:40 am to provider [facility] , who verbally acknowledged these results. Electronically Signed   By: Lovena Le M.D.   On: 08/24/2019 03:40      IMPRESSION AND PLAN:  1.  Severe constipation with subsequent massive bowel  dilatation, concerning for impaction with stercoral colitis. -The patient will be admitted to a medical bed. -She will be kept n.p.o. except for medications with resolution of her vomiting. -She received lactulose enema.  We will add as needed Fleet enema as well as give her magnesium citrate as needed. -She underwent disimpaction in the ER. -She will receive osmotic agent as well as lubricant laxative, stool softener and fiber supplements with improvement of her vomiting to cut down on stool load and prevent ischemic bowel. -At this time her hyperemic bowel is not much changed per Dr. Peyton Najjar compared to a year ago. -Dr. Peyton Najjar was consulted and will follow with [place]. -We will optimize potassium and magnesium levels -We will check her TSH level. -We will also attempt colchicine when she is able to take p.o. that should help with associated peritoneal inflammation and to stimulate bowel movements. -She will have bowel massage and warm compresses 3 times a day.  2.  Leukocytosis. -This like secondary to her severe constipation as well as recurrent vomiting. -We will follow CBC with above management.  3.  Recurrent vomiting likely was associated functional bowel obstruction secondary to #1. -This like secondary to severe constipation and bowel distention. -An NG tube was placed -We will repeat two-view abdomen  x-ray in a.m. for follow-up.  4.  DVT prophylaxis. -Subcutaneous Lovenox.    All the records are reviewed and case discussed with ED provider. The plan of care was discussed in details with the patient (and family). I answered all questions. The patient agreed to proceed with the above mentioned plan. Further management will depend upon hospital course.   CODE STATUS: Full code  TOTAL TIME TAKING CARE OF THIS PATIENT: 55 minutes.    Christel Mormon M.D on 08/29/2019 at 5:12 AM  [facility]   From 7 PM-7 AM, contact night-coverage [email or web address]  CC: Primary care  physician; Gala Romney, MD   Note: This dictation was prepared with Dragon dictation along with smaller phrase technology. Any transcriptional errors that result from this process are unintentional.

## 2019-09-02 NOTE — ED Notes (Signed)
Pt had small BM. Pt cleaned and placed in new gown. When patient reports feeling as though she was having another BM. Upon assessment pt has passing a large amount of liquid stool. Pt cleaned and placed back on bed pan.

## 2019-09-02 NOTE — TOC Initial Note (Signed)
Transition of Care [facility]) - Initial/Assessment Note    Patient Details  Name: Diana Hawkins MRN: [id number] Date of Birth: 1932/04/12  [facility] of [facility]) CM/SW Contact:    Beverly Sessions, RN Phone Number: 08/29/2019, 3:27 PM  Clinical Narrative:                 Patient from [facility].  Confirmed with [name] at [facility] that patient can return when medically cleared  Patient open with [facility] for RN, PT, OT.  Sarah with [facility] notified of admission  Patient currently with NG tube         Patient Goals and CMS Choice        Expected Discharge Plan and Services                                                Prior Living Arrangements/Services                       Activities of Daily Living   ADL Screening (condition at time of admission) Is the patient deaf or have difficulty hearing?: No Does the patient have difficulty seeing, even when wearing glasses/contacts?: No Does the patient have difficulty concentrating, remembering, or making decisions?: Yes Does the patient have difficulty dressing or bathing?: Yes Does the patient have difficulty walking or climbing stairs?: Yes  Permission Sought/Granted                  Emotional Assessment              Admission diagnosis:  Abdominal distention [R14.0] Obstipation [K59.00] Intractable nausea and vomiting [R11.2] Constipation [K59.00] Patient Active Problem List   Diagnosis Date Noted  . Obstipation 09/01/2019  . Constipation 08/26/2018  . Abdominal pain 08/24/2018  . Cognitive deficit, post-stroke 10/04/2014  . Trochanteric bursitis of left hip 10/04/2014  . SAH (subarachnoid hemorrhage) ([place]) 09/26/2014  . Subarachnoid hemorrhage ([place]) 09/22/2014   PCP:  Gala Romney, MD Pharmacy:   [facility] [place] [address] [place] [address] Phone: [phone] Fax:  [phone]     Social Determinants of Health (SDOH) Interventions    Readmission Risk Interventions No flowsheet data found.

## 2019-09-02 NOTE — ED Notes (Signed)
Pt vomiting at this time. Sitting on bed pan. Small amount of stool at this time.

## 2019-09-02 NOTE — ED Notes (Signed)
Surgeon to bedside. Pt verbalized understanding of problem, admission and interventions. Pt denies wanting NG tube at this time but understands if vomiting continues it may be necessary.

## 2019-09-02 NOTE — ED Provider Notes (Addendum)
[facility] Emergency Department Provider Note  ____________________________________________   First MD Initiated Contact with Patient 08/20/2019 [phone]     (approximate)  I have reviewed the triage vital signs and the nursing notes.   HISTORY  Chief Complaint Constipation    HPI Diana Hawkins is a 86 y.o. female with medical history as listed below who presents by EMS from [facility] with complaints of abdominal pain, constipation, abdominal distention, and nausea.  She was seen about 24 hours ago for the same issues.  This has been an ongoing issue for her and she was admitted to the hospital about a year ago for obstipation.  She says she has had no dietary changes recently or medication changes of which she is aware.  She has a history of colon cancer but is not currently being treated.  She says she has not been able to eat or drink very much today and the nausea and abdominal distention is severe.  She had some success last night when she was seen in the ED after manual fecal disimpaction and enemas but it continued to be an issue and she developed gradually worsening abdominal pain over the course of the day and night.  Nothing in particular makes it better.         Past Medical History:  Diagnosis Date  . Anemia 07/2008   extreme anemia  . Depressive disorder   . Hepatitis A 01/1976  . Hypertension   . Irregular heartbeat 02/2001   Irregular heartbeat checked  . Stroke [facility])     Patient Active Problem List   Diagnosis Date Noted  . Constipation 08/26/2018  . Abdominal pain 08/24/2018  . Cognitive deficit, post-stroke 10/04/2014  . Trochanteric bursitis of left hip 10/04/2014  . SAH (subarachnoid hemorrhage) ([place]) 09/26/2014  . Subarachnoid hemorrhage ([place]) 09/22/2014    Past Surgical History:  Procedure Laterality Date  . ABDOMINAL HYSTERECTOMY  01/1968   Complete   . BREAST SURGERY Right 11/1966   Benign tumor right  breast removed  . CATARACT EXTRACTION W/ INTRAOCULAR LENS IMPLANT Bilateral 04/03 L eye; 06/03 R eye  . COLON SURGERY  1/10   Stage 3 Cancer  . IR GENERIC HISTORICAL  09/22/2014   IR ANGIO INTRA EXTRACRAN SEL COM CAROTID INNOMINATE BILAT MOD SED 09/22/2014 [name], MD [facility]-INTERV RAD  . IR GENERIC HISTORICAL  09/22/2014   IR ANGIO VERTEBRAL SEL VERTEBRAL UNI R MOD SED 09/22/2014 [name], MD [facility]INTER[facility]  . OOPHORECTOMY Right 08/1964   Right Ovary Removed, benign tumor  . PANCREATIC CYST DRAINAGE  03/2012   Benign    Prior to Admission medications   Medication Sig Start Date End Date Taking? Authorizing Provider  acetaminophen (TYLENOL) 650 MG CR tablet Take 650 mg by mouth at bedtime.   Yes [provider]  Biotin 1 MG CAPS Take 500 mcg by mouth every evening.    Yes [provider]  Dermatological Products, Misc. (MOISTURE BARRIER) OINT Apply 1 application topically 3 (three) times daily. Apply to sacrum topically every shift    Yes [provider]  fluticasone (FLONASE) 50 MCG/ACT nasal spray Place 1 spray into both nostrils 2 (two) times daily.   Yes [provider]  furosemide (LASIX) 20 MG tablet Take 10 mg by mouth daily.   Yes [provider]  gabapentin (NEURONTIN) 100 MG capsule Take 1 capsule (100 mg total) by mouth 3 (three) times daily. Patient taking differently: Take 200 mg  by mouth at bedtime.  04/29/15  Yes Mesner, Corene Cornea, MD  Lactase (LACTAID FAST ACT) 9000 units TABS Take 18,000 Units by mouth 2 (two) times daily.   Yes [provider]  loperamide (IMODIUM A-D) 2 MG tablet Take 2 mg by mouth 4 (four) times daily as needed for diarrhea or loose stools.   Yes [provider]  mirtazapine (REMERON) 15 MG tablet Take 15 mg by mouth at bedtime.   Yes [provider]  Multiple Vitamin (DAILY VITE) TABS Take 1 tablet by mouth daily.   Yes [provider]  [place] (OYSTER CALCIUM) 500  MG TABS tablet Take 1,000 mg of elemental calcium by mouth daily.   Yes [provider]  polyethylene glycol (MIRALAX / GLYCOLAX) 17 g packet Take 17 g by mouth daily as needed.   Yes [provider]  potassium chloride (K-DUR,KLOR-CON) 10 MEQ tablet Take 10 mEq by mouth daily.   Yes [provider]  Probiotic Product (RISA-BID PROBIOTIC) TABS Take 1 tablet by mouth every evening.   Yes [provider]  simethicone (MYLICON) 80 MG chewable tablet Chew 80 mg by mouth 2 (two) times daily. Before lunch and dinner.   Yes [provider]  solifenacin (VESICARE) 5 MG tablet Take 5 mg by mouth daily.   Yes [provider]    Allergies Codeine and Reglan [metoclopramide]  No family history on file.  Social History Social History   Tobacco Use  . Smoking status: Never Smoker  . Smokeless tobacco: Never Used  Substance Use Topics  . Alcohol use: No    Alcohol/week: 0.0 standard drinks  . Drug use: No    Review of Systems Constitutional: No fever/chills Eyes: No visual changes. ENT: No sore throat. Cardiovascular: Denies chest pain. Respiratory: Denies shortness of breath. Gastrointestinal: Nausea, abdominal pain, distention, and constipation. Genitourinary: Negative for dysuria. Musculoskeletal: Negative for neck pain.  Negative for back pain. Integumentary: Negative for rash. Neurological: Negative for headaches, focal weakness or numbness.   ____________________________________________   PHYSICAL EXAM:  VITAL SIGNS: ED Triage Vitals  Enc Vitals Group     BP 08/27/2019 0136 (!) 157/94     Pulse Rate 08/25/2019 0137 76     Resp 08/09/2019 0137 (!) 22     Temp 08/15/2019 0142 98.3 F (36.8 C)     Temp Source 08/08/2019 0142 Oral     SpO2 08/08/2019 0137 99 %     Weight 08/09/2019 0139 55.8 kg (123 lb)     Height 08/30/2019 0139 1.651 m (5\' 5" )     Head Circumference --      Peak Flow --      Pain Score 08/21/2019 0139 6     Pain Loc  --      Pain Edu? --      Excl. in [place]? --     Constitutional: Alert and oriented.  Chronically ill-appearing.  Appears somewhat uncomfortable. Eyes: Conjunctivae are normal.  Head: Atraumatic. Nose: No congestion/rhinnorhea. Mouth/Throat: Patient is wearing a mask. Neck: No stridor.  No meningeal signs.   Cardiovascular: Normal rate, regular rhythm. Good peripheral circulation. Grossly normal heart sounds. Respiratory: Normal respiratory effort.  No retractions. Gastrointestinal: Lower abdomen is distended and firm but not particularly tender.  The patient has some mild tenderness to palpation all throughout the abdomen but without any focal tenderness. Musculoskeletal: No lower extremity tenderness nor edema. No gross deformities of extremities. Neurologic:  Normal speech and language. No gross focal neurologic  deficits are appreciated.  Skin:  Skin is warm, dry and intact. Psychiatric: Mood and affect are normal. Speech and behavior are normal.  ____________________________________________   LABS (all labs ordered are listed, but only abnormal results are displayed)  Labs Reviewed  CBC WITH DIFFERENTIAL/PLATELET - Abnormal; Notable for the following components:      Result Value   WBC 14.4 (*)    RBC 5.23 (*)    Neutro Abs 12.6 (*)    All other components within normal limits  COMPREHENSIVE METABOLIC PANEL - Abnormal; Notable for the following components:   Glucose, Bld 131 (*)    BUN 29 (*)    GFR calc non Af Amer 53 (*)    All other components within normal limits  SARS CORONAVIRUS 2 (TAT 6-24 HRS)  LACTIC ACID, PLASMA  PROCALCITONIN  MAGNESIUM  TSH   ____________________________________________  EKG  ED ECG REPORT I, Hinda Kehr, the attending physician, personally viewed and interpreted this ECG.  Date: 09/01/2019 EKG Time: 4:06 AM Rate: 112 Rhythm: Sinus tachycardia QRS Axis: normal Intervals: Left anterior fascicular block ST/T Wave abnormalities:  Non-specific ST segment / T-wave changes, but no clear evidence of acute ischemia. Narrative Interpretation: no definitive evidence of acute ischemia; does not meet STEMI criteria.   ____________________________________________  RADIOLOGY I, Hinda Kehr, personally viewed and evaluated these images (plain radiographs) as part of my medical decision making, as well as reviewing the written report by the radiologist.  ED MD interpretation: Profoundly distended rectum and colon with concerns for hyperemic bowel wall.  Official radiology report(s): DG Abdomen 1 View  Result Date: 08/23/2019 CLINICAL DATA:  NG tube evaluation. EXAM: ABDOMEN - 1 VIEW COMPARISON:  CT 08/31/2019.  KUB 09/01/2019 FINDINGS: NG tube noted with its tip and side hole projected over the stomach. Surgical clips right upper quadrant. Severe colonic distention with large amount of stool again noted. No free air identified. Elevation of left hemidiaphragm noted. IMPRESSION: 1.  NG tube noted with its tip and side hole project over stomach. 2. Severe colonic distention with large amount of stool again noted. Reference is made to prior CT report of 08/28/2019. Electronically Signed   By: Marcello Moores  Register   On: 08/22/2019 06:21   CT ABDOMEN PELVIS W CONTRAST  Result Date: 08/23/2019 CLINICAL DATA:  Bowel obstruction suspected, constipation and distention EXAM: CT ABDOMEN AND PELVIS WITH CONTRAST TECHNIQUE: Multidetector CT imaging of the abdomen and pelvis was performed using the standard protocol following bolus administration of intravenous contrast. CONTRAST:  110mL OMNIPAQUE IOHEXOL 300 MG/ML  SOLN COMPARISON:  CT abdomen pelvis 08/24/2018, abdominal radiograph 09/01/2019 FINDINGS: Lower chest: Air cyst seen in the left lung base. Basilar atelectatic changes present as well. Mild cardiomegaly with slight mass effect on the right heart by a severe pectus deformity of the chest (Haller index of 4.1). Hepatobiliary: There is a  bilobed cyst in the left lobe liver measuring approximately 1.4 cm in size which is unchanged from comparison study (2/14). There is mild intra and extrahepatic biliary ductal dilatation which may be related to senescent change in the absence of visualized gallstones in either the gallbladder or biliary tree. No pericholecystic inflammation is seen. Pancreas: Limited visualization due to marked compression by the massively distended colon and rectum. Spleen: Normal in size without focal abnormality. Adrenals/Urinary Tract: Nodular thickening of the adrenal glands may reflect some mild senescent hyperplasia without dominant or concerning adrenal lesion. 1.7 cm fluid attenuation cyst is present in the inferior pole right kidney, unchanged  from prior. Additional subcentimeter hypoattenuating foci in both kidneys are too small to fully characterize on CT imaging but statistically likely benign. No obstructive urolithiasis or hydronephrosis. Bladder is significantly indented posteriorly by the massively distended colon and rectum. Stomach/Bowel: Distal esophagus is circumferentially thickened with adjacent stranding and mucosal hyperemia. The stomach is markedly distended with ingested material. Much of the small bowel is compressed by the massively enlarged stool filled colon. There are postsurgical changes in the right hemiabdomen likely reflecting prior right hemicolectomy and ileocolic anastomosis. The proximal colonic segments are largely air distended with minimal formed stool. The sigmoid and rectum are massively distended, the rectum measuring up to 14 cm in maximal diameter with large amount of inspissated stool and circumferential mural thickening with adjacent Peri colonic and perirectal fat stranding. No evidence of pneumatosis at this time. Some hyperdensity within the mural thickening of the colon and rectum could reflect early venous engorgement. Vascular/Lymphatic: Atherosclerotic plaque within the normal  caliber aorta. Reproductive: Uterus is surgically absent. No concerning adnexal lesions. No visible adnexal lesions though the low pelvis is distorted by marked colonic distention. Other: Some reactive free fluid is noted in the pelvis with extensive stranding about the colon and rectum. No free air. Mild circumferential body wall edema. Musculoskeletal: The osseous structures appear diffusely demineralized which may limit detection of small or nondisplaced fractures. Multilevel degenerative changes are present in the imaged portions of the spine. Extensive fatty marrow changes noted throughout the pelvic girdle and proximal femora. Sclerotic changes of the femoral heads the hand IMPRESSION: 1. Massively distended colon and rectum with large amount of inspissated stool and circumferential mural thickening with adjacent fat stranding. Findings are concerning for impaction with stercoral colitis. No evidence of pneumatosis at this time though hyperemia of the bowel wall which could be inflammatory or suggest early features of pressure necrosis given the degree of distention. Urgent surgical consultation is warranted. 2. Circumferential thickening of the distal esophagus with adjacent stranding and mucosal hyperemia may reflect esophagitis. Correlate with patient's symptoms. Critical Value/emergent results were called by telephone at the time of interpretation on 08/10/2019 at 3:40 am to provider [facility] , who verbally acknowledged these results. Electronically Signed   By: Lovena Le M.D.   On: 08/07/2019 03:40    ____________________________________________   PROCEDURES   Procedure(s) performed (including Critical Care):  Fecal disimpaction  Date/Time: 08/11/2019 4:30 AM Performed by: Hinda Kehr, MD Authorized by: Hinda Kehr, MD  Consent: Verbal consent obtained. Risks and benefits: risks, benefits and alternatives were discussed Consent given by: patient Patient identity confirmed:  verbally with patient Local anesthesia used: no  Anesthesia: Local anesthesia used: no  Sedation: Patient sedated: no  Patient tolerance: patient tolerated the procedure well with no immediate complications Comments: large amount of soft stool is present in rectum, but no solid, formed, hard stool ball      ____________________________________________   INITIAL IMPRESSION / MDM / ASSESSMENT AND PLAN / ED COURSE  As part of my medical decision making, I reviewed the following data within the [place] notes reviewed and incorporated, Labs reviewed , EKG interpreted , Old chart reviewed, Discussed with admitting physician (Dr. Sidney Ace), discussed with radiology, discussed with surgery, A consult was requested and obtained from this/these consultant(s) Surgery (Dr. Peyton Najjar) and Notes from prior ED visits   Differential diagnosis includes, but is not limited to, obstipation, SBO/ileus, recurrence of cancer, IBS.  The patient was seen yesterday and had some success and was  able to be discharged but her symptoms are persistent and apparently worsening.  I reviewed the medical record and saw that she was admitted for obstipation about a year ago and required a 4-day stay in the hospital for improvement of her symptoms.  At this point she may have failed outpatient treatment after aggressive bowel management regimen and manual fecal disimpaction last night in the ED.  She is ill-appearing but a lot of this may be chronic.  Her abdomen is firm and mildly tender to palpation.  She was scanned within the last few weeks but it is reasonable to obtain another scan since her symptoms seem to be worsening.  I will attempt to have her scanned with oral and IV contrast for optimal imaging.  I gave Zofran 4 mg IV for her nausea.  Lab work is notable for a leukocytosis of 14 which notably she had during her obstipation admission in the past.  Comprehensive metabolic panel is reassuring  with no acute abnormalities.      Clinical Course as of Sep 01 638  Fri Sep 02, 2019  [id number] While I was in the room with the patient, she vomited into her respiratory mask.  She did not aspirate and removed the mask quickly and cleaned her up but she clearly will not be able to tolerate any additional oral contrast at this time.  The vomit was dark in color but did not appear consistent with hematemesis.  We will proceed with IV contrast only study to evaluate for stool burden as well as to look for signs of SBO/ileus.   [CF]  [phone] I took a phone call from radiology who is extremely concerned about the CT scan results.  He said that she is severely obstipated and is starting to show colonic wall inflammatory changes concerning for the possibility of developing necrosis due to the pressure of the stool burden.  I will call surgery to discuss the case but anticipate admission to medicine for aggressive bowel regimen, surgery consultation, and GI consultation.   [CF]  0400 I discussed the case with Dr. Peyton Najjar who reviewed the CT scan and said that he agrees that she should be admitted for aggressive bowel regimen and surgical and GI consultation but that she does not need emergent surgery at this time.  He recommended a lactulose enema which I have ordered.  I previously added on a lactic acid and procalcitonin which are pending.  I have consulted the hospitalist for admission.   [CF]  [phone] Discussed case with Dr. Sidney Ace who will admit.   [CF]  [phone] I went back to the room to try and perform a digital rectal exam and fecal disimpaction, but the patient is actively vomiting and I am concerned that if I lay her on her side for the disimpaction she may aspirate.  Her EKG is generally reassuring in terms of her intervals and I think that she needs a more aggressive antiemetic.  I have ordered droperidol 1.25 mg IV to try to help with her nausea and if possible I will again perform a rectal exam, but as per  the MDs report yesterday, there was minimal soft stool reachable manually last night.   [CF]  0431 Dr. Peyton Najjar evaluated the patient in person and discussed the case in person with me.  He had the same plan and recommendations although he added that if the patient continues to vomit she may need an NG tube.   [CF]  0446 Lactic Acid, Venous:  1.3 [CF]  0447 Procalcitonin: <0.10 [CF]  [id number] Patient is still vomiting.  As per my discussion with Dr. Peyton Najjar, I have ordered the placement of an NG tube to low intermittent suction.  I was able to perform rectal exam and although there is a large volume of soft stool present, there is not a large, firm, formed stool ball that is easily disimpacted.  I think that proceeding with frequent enemas will be most likely to flush out the rectum and colon rather than trying to pull out unformed and soft stool manually.   [CF]  [phone] NG tube appears appropriately placed  DG Abdomen 1 View [CF]  0640 Copious drainage of stool with the lactulose enema, still progressing   [CF]    Clinical Course User Index [CF] Hinda Kehr, MD     ____________________________________________  FINAL CLINICAL IMPRESSION(S) / ED DIAGNOSES  Final diagnoses:  Obstipation  Intractable nausea and vomiting     MEDICATIONS GIVEN DURING THIS VISIT:  Medications  iohexol (OMNIPAQUE) 9 MG/ML oral solution 500 mL (500 mLs Oral Contrast Given 08/12/2019 0212)  mirtazapine (REMERON) tablet 15 mg (has no administration in time range)  lactase (LACTAID) tablet 18,000 Units (has no administration in time range)  polyethylene glycol (MIRALAX / GLYCOLAX) packet 17 g (has no administration in time range)  darifenacin (ENABLEX) 24 hr tablet 7.5 mg (has no administration in time range)  simethicone (MYLICON) chewable tablet 80 mg (has no administration in time range)  acidophilus (RISAQUAD) capsule 1 capsule (has no administration in time range)  gabapentin (NEURONTIN) capsule 200 mg (has no  administration in time range)  multivitamin with minerals tablet 1 tablet (has no administration in time range)  potassium chloride SA (KLOR-CON) CR tablet 10 mEq (has no administration in time range)  fluticasone (FLONASE) 50 MCG/ACT nasal spray 1 spray (has no administration in time range)  liver oil-zinc oxide (DESITIN) 40 % ointment 1 application (has no administration in time range)  enoxaparin (LOVENOX) injection 40 mg (has no administration in time range)  0.9 %  sodium chloride infusion ( Intravenous New Bag/Given 08/15/2019 0551)  acetaminophen (TYLENOL) tablet 650 mg (has no administration in time range)    Or  acetaminophen (TYLENOL) suppository 650 mg (has no administration in time range)  traZODone (DESYREL) tablet 25 mg (has no administration in time range)  magnesium hydroxide (MILK OF MAGNESIA) suspension 30 mL (has no administration in time range)  ondansetron (ZOFRAN) tablet 4 mg (has no administration in time range)    Or  ondansetron (ZOFRAN) injection 4 mg (has no administration in time range)  sodium phosphate (FLEET) 7-19 GM/118ML enema 1 enema (has no administration in time range)  magnesium citrate solution 1 Bottle (0 Bottles Oral Hold 08/25/2019 0527)  psyllium (HYDROCIL/METAMUCIL) packet 1 packet (has no administration in time range)  docusate sodium (COLACE) capsule 100 mg (has no administration in time range)  lactulose (CHRONULAC) 10 GM/15ML solution 30 g (has no administration in time range)  colchicine tablet 0.6 mg (has no administration in time range)  ondansetron (ZOFRAN) injection 4 mg (4 mg Intravenous Given 08/20/2019 0207)  iohexol (OMNIPAQUE) 300 MG/ML solution 100 mL (100 mLs Intravenous Contrast Given 08/06/2019 0305)  lactulose (CHRONULAC) enema 200 gm (300 mLs Rectal Given 08/15/2019 0459)  droperidol (INAPSINE) 2.5 MG/ML injection 1.25 mg (1.25 mg Intravenous Given 08/18/2019 0419)  lidocaine (XYLOCAINE) 2 % viscous mouth solution 15 mL (15 mLs Mouth/Throat Given  09/03/2019 0550)     ED Discharge Orders  None      *Please note:  SARINE LIVING was evaluated in Emergency Department on 08/24/2019 for the symptoms described in the history of present illness. She was evaluated in the context of the global COVID-19 pandemic, which necessitated consideration that the patient might be at risk for infection with the SARS-CoV-2 virus that causes COVID-19. Institutional protocols and algorithms that pertain to the evaluation of patients at risk for COVID-19 are in a state of rapid change based on information released by regulatory bodies including the CDC and federal and state organizations. These policies and algorithms were followed during the patient's care in the ED.  Some ED evaluations and interventions may be delayed as a result of limited staffing during the pandemic.*  Note:  This document was prepared using Dragon voice recognition software and may include unintentional dictation errors.   Hinda Kehr, MD 08/28/2019 [id number]    Hinda Kehr, MD 08/15/2019 [phone]

## 2019-09-02 NOTE — ED Triage Notes (Signed)
Pt here via EMS from [facility] with complaints of constipation and distension. Hx of the same with relief from enema. Pt was here last night for the same, pt was able to have small BM after enema.  Pt is also complaining of nausea at this time.   Pt reports being "bloated" for two weeks. Hx of IBS and colon cancer.

## 2019-09-02 NOTE — ED Notes (Signed)
MD attempted to disimpact any hard stool around the rectum. No hard stool found.

## 2019-09-02 NOTE — ED Notes (Signed)
Pt vomiting again.

## 2019-09-02 NOTE — ED Notes (Signed)
Pt calling out reporting continued nausea. MD made aware.

## 2019-09-02 NOTE — Consult Note (Signed)
SURGICAL CONSULTATION NOTE   HISTORY OF PRESENT ILLNESS (HPI):  86 y.o. female presented to [facility] ED for evaluation of abdominal pain abdominal distention since few days ago.  At the moment of my evaluation the patient reported that her abdominal pain "is not really bad".  The patient cannot identify a focalized area of abdominal pain.  She does endorses having nausea.  There is no pain radiation.  There is no alleviating or aggravating factor.    Patient came to the ED 24 hours ago.  She was seen by Dr. Owens Shark who reported that he manually disimpacted the patient.  Patient also received enema and had a large bowel movement.  Patient will discharge.  The patient comes today again but this time with nausea.  During this ED visit, CT scan of the abdomen was done showing severe dilation of the colon.  There is no free air or free fluid.  There is no pneumatosis intestinalis identified.  I personally evaluated the images and I also compared images with a CT scan done on January 2020.  The large intestine look identical the same as a year ago.  Surgery is consulted by Dr. Karma Greaser in this context for evaluation and management of fecal impaction.  PAST MEDICAL HISTORY (PMH):  Past Medical History:  Diagnosis Date  . Anemia 07/2008   extreme anemia  . Depressive disorder   . Hepatitis A 01/1976  . Hypertension   . Irregular heartbeat 02/2001   Irregular heartbeat checked  . Stroke [facility])      PAST SURGICAL HISTORY ([place]):  Past Surgical History:  Procedure Laterality Date  . ABDOMINAL HYSTERECTOMY  01/1968   Complete   . BREAST SURGERY Right 11/1966   Benign tumor right breast removed  . CATARACT EXTRACTION W/ INTRAOCULAR LENS IMPLANT Bilateral 04/03 L eye; 06/03 R eye  . COLON SURGERY  1/10   Stage 3 Cancer  . IR GENERIC HISTORICAL  09/22/2014   IR ANGIO INTRA EXTRACRAN SEL COM CAROTID INNOMINATE BILAT MOD SED 09/22/2014 [name], MD [facility]  . IR GENERIC HISTORICAL  09/22/2014    IR ANGIO VERTEBRAL SEL VERTEBRAL UNI R MOD SED 09/22/2014 [name], MD [facility]  . OOPHORECTOMY Right 08/1964   Right Ovary Removed, benign tumor  . PANCREATIC CYST DRAINAGE  03/2012   Benign     MEDICATIONS:  Prior to Admission medications   Medication Sig Start Date End Date Taking? Authorizing Provider  acetaminophen (TYLENOL) 650 MG CR tablet Take 650 mg by mouth at bedtime.    [provider]  Biotin 1 MG CAPS Take 500 mcg by mouth every evening.     [provider]  Dermatological Products, Misc. (MOISTURE BARRIER) OINT Apply 1 application topically See admin instructions. Apply to sacrum topically every shift    [provider]  fluticasone (FLONASE) 50 MCG/ACT nasal spray Place 1 spray into both nostrils 2 (two) times daily.    [provider]  furosemide (LASIX) 20 MG tablet Take 10 mg by mouth daily.    [provider]  gabapentin (NEURONTIN) 100 MG capsule Take 1 capsule (100 mg total) by mouth 3 (three) times daily. Patient taking differently: Take 200 mg by mouth at bedtime.  04/29/15   Mesner, Corene Cornea, MD  Lactase (LACTAID FAST ACT) 9000 units TABS Take 18,000 Units by mouth 2 (two) times daily.    [provider]  loperamide (IMODIUM A-D) 2 MG tablet Take 2 mg by mouth 4 (four) times daily  as needed for diarrhea or loose stools.    [provider]  mirtazapine (REMERON) 15 MG tablet Take 15 mg by mouth at bedtime.    [provider]  Multiple Vitamin (DAILY VITE) TABS Take 1 tablet by mouth daily.    [provider]  Loma [place] (OYSTER CALCIUM) 500 MG TABS tablet Take 1,000 mg of elemental calcium by mouth daily.    [provider]  polyethylene glycol (MIRALAX / GLYCOLAX) 17 g packet Take 17 g by mouth daily as needed.    [provider]  potassium chloride (K-DUR,KLOR-CON) 10 MEQ tablet Take 10 mEq by mouth daily.    [provider]  Probiotic Product  (RISA-BID PROBIOTIC) TABS Take 1 tablet by mouth every evening.    [provider]  simethicone (MYLICON) 80 MG chewable tablet Chew 80 mg by mouth 2 (two) times daily. Before lunch and dinner.    [provider]  solifenacin (VESICARE) 5 MG tablet Take 5 mg by mouth daily.    [provider]     ALLERGIES:  Allergies  Allergen Reactions  . Codeine Nausea And Vomiting and Other (See Comments)    "feels like I want to climb the walls" and dizziness  . Reglan [Metoclopramide] Other (See Comments)    Stomach cramps     SOCIAL HISTORY:  Social History   Socioeconomic History  . Marital status: Widowed    Spouse name: Not on file  . Number of children: Not on file  . Years of education: Not on file  . Highest education level: Not on file  Occupational History  . Not on file  Tobacco Use  . Smoking status: Never Smoker  . Smokeless tobacco: Never Used  Substance and Sexual Activity  . Alcohol use: No    Alcohol/week: 0.0 standard drinks  . Drug use: No  . Sexual activity: Not Currently  Other Topics Concern  . Not on file  Social History Narrative  . Not on file   Social Determinants of Health   Financial Resource Strain:   . Difficulty of Paying Living Expenses: Not on file  Food Insecurity:   . Worried About Charity fundraiser in the Last Year: Not on file  . Ran Out of Food in the Last Year: Not on file  Transportation Needs:   . Lack of Transportation (Medical): Not on file  . Lack of Transportation (Non-Medical): Not on file  Physical Activity:   . Days of Exercise per Week: Not on file  . Minutes of Exercise per Session: Not on file  Stress:   . Feeling of Stress : Not on file  Social Connections:   . Frequency of Communication with Friends and Family: Not on file  . Frequency of Social Gatherings with Friends and Family: Not on file  . Attends Religious Services: Not on file  . Active Member of Clubs or Organizations: Not on file   . Attends Archivist Meetings: Not on file  . Marital Status: Not on file  Intimate Partner Violence:   . Fear of Current or Ex-Partner: Not on file  . Emotionally Abused: Not on file  . Physically Abused: Not on file  . Sexually Abused: Not on file    The patient currently resides (home / rehab facility / nursing home): Home The patient normally is (ambulatory / bedbound): Ambulatory   FAMILY HISTORY:  No family history on file.   REVIEW OF SYSTEMS:  Constitutional: denies weight  loss, fever, chills, or sweats  Eyes: denies any other vision changes, history of eye injury  ENT: denies sore throat, hearing problems  Respiratory: denies shortness of breath, wheezing  Cardiovascular: denies chest pain, palpitations  Gastrointestinal: Positive abdominal pain, N/V, positive for severe constipation Genitourinary: denies burning with urination or urinary frequency Musculoskeletal: denies any other joint pains or cramps  Skin: denies any other rashes or skin discolorations  Neurological: denies any other headache, dizziness, weakness  Psychiatric: denies any other depression, anxiety.  Positive for dementia  All other review of systems were negative   VITAL SIGNS:  Temp:  [97.6 F (36.4 C)-98.3 F (36.8 C)] 98.3 F (36.8 C) (01/29 0142) Pulse Rate:  [60-84] 84 (01/29 0330) Resp:  [11-22] 14 (01/29 0330) BP: (135-174)/(55-94) 166/94 (01/29 0330) SpO2:  [99 %-100 %] 99 % (01/29 0330) Weight:  [55.8 kg] 55.8 kg (01/29 0139)     Height: 5\' 5"  (165.1 cm) Weight: 55.8 kg BMI (Calculated): 20.47   INTAKE/OUTPUT:  This shift: No intake/output data recorded.  Last 2 shifts: @IOLAST2SHIFTS @   PHYSICAL EXAM:  Constitutional:  -- Normal body habitus  -- Awake, alert, and oriented x3  Eyes:  -- Pupils equally round and reactive to light  -- No scleral icterus  Ear, nose, and throat:  -- No jugular venous distension  Pulmonary:  -- No crackles  -- Equal breath sounds  bilaterally -- Breathing non-labored at rest Cardiovascular:  -- S1, S2 present  -- No pericardial rubs Gastrointestinal:  -- Abdomen soft, mild tender, distended, no guarding or rebound tenderness -- No abdominal masses appreciated, pulsatile or otherwise  --Palpable stool throughout the abdomen Musculoskeletal and Integumentary:  -- Wounds or skin discoloration: None appreciated -- Extremities: B/L UE and LE FROM, hands and feet warm, no edema  Neurologic:  -- Motor function: intact and symmetric -- Sensation: intact and symmetric   Labs:  CBC Latest Ref Rng & Units 08/12/2019 09/01/2019 01/01/2019  WBC 4.0 - 10.5 K/uL 14.4(H) 10.7(H) 6.8  Hemoglobin 12.0 - 15.0 g/dL 14.5 13.7 14.3  Hematocrit 36.0 - 46.0 % 45.6 43.1 45.7  Platelets 150 - 400 K/uL 295 292 309   CMP Latest Ref Rng & Units 08/13/2019 09/01/2019 01/01/2019  Glucose 70 - 99 mg/dL 131(H) 113(H) 98  BUN 8 - 23 mg/dL 29(H) 26(H) 21  Creatinine 0.44 - 1.00 mg/dL 0.97 0.86 0.82  Sodium 135 - 145 mmol/L 143 140 143  Potassium 3.5 - 5.1 mmol/L 3.8 3.8 3.8  Chloride 98 - 111 mmol/L 101 102 106  CO2 22 - 32 mmol/L 31 32 29  Calcium 8.9 - 10.3 mg/dL 9.8 9.5 9.2  Total Protein 6.5 - 8.1 g/dL 7.6 6.7 7.4  Total Bilirubin 0.3 - 1.2 mg/dL 0.8 0.5 0.6  Alkaline Phos 38 - 126 U/L 79 62 78  AST 15 - 41 U/L 22 21 20   ALT 0 - 44 U/L 17 16 15      Imaging studies:  I personally evaluated the CT scan and comparing to CT scan done a year ago.  Patient with severe constipation with severe chronic dilation of the large intestine.  There is no pneumatosis intestinalis.  There is no free air.  There is no free fluid.  Assessment/Plan:  86 y.o. female with fecal impaction, complicated by pertinent comorbidities including advanced age, dementia, hypertension, history of stroke, depressive disorder, history of colon cancer with partial colectomy and severe chronic constipation.  Patient with recurrent fecal impaction now with nausea and  vomiting.  There is no acute abdomen at this moment.  CT scan without significant change from a year ago.  I have not considered that there is any indication for surgical management at this moment.  I did recommend to put NGT if patient continued to vomit.  I also recommend serial enemas and manual disimpaction.  Daughter Dois Davenport was called at [phone] but did not answer.  I will continue to follow along in case patient needs surgical management during admission.  Patient very high risk for any surgical management at this moment.  Arnold Long, MD

## 2019-09-02 NOTE — ED Notes (Signed)
Admitting Md at bedside

## 2019-09-02 NOTE — ED Notes (Signed)
Pt having multiple episodes of vomiting. Pt. sitting up with emesis bag held by nurse. MD aware.

## 2019-09-02 NOTE — ED Notes (Signed)
This RN at bedside and heard pt state she did not want resuscitation if her heart were to stop. Pt stated she had a living will and DNR.

## 2019-09-02 NOTE — ED Notes (Signed)
Pt noted to have small liquid bowel movement. Pt cleaned and new brief applied.

## 2019-09-02 NOTE — Progress Notes (Addendum)
Brief hospitalist update note Nonbillable note, please see same day H&P for full billable details.  Briefly this is a 86 year old female with a known history of severe constipation and colonic dilatation who came in with chief complaint of severe constipation/obstipation.  Surgery was consulted from the emergency department given the massive colonic dilatation seen on imaging.  Patient with recurrent vomiting on presentation.  NG tube was placed and is in place at time of my evaluation.  Per nursing documentation patient has had several bowel movements however repeat KUB this morning continues to demonstrate severe colonic stool burden.  Vital signs are stable at time of my evaluation however patient appears extremely weak and deconditioned.  NG tube in place.  Belly distended.  Surprisingly minimal pain on palpation.  We will continue with aggressive scheduled enema regimen in addition to oral bowel regimen.  General surgery following.  At this time no indication for emergent surgical intervention however patient remains high risk for surgical intervention during this admission.  Discussed CODE STATUS with patient.  She wishes to be a DNR.  Discussed plan of care with patients daughter Dois Davenport [phone]).  All questions answered.  Per daughter's request, please contact her with any changes in patient status and any possibility of surgical intervention  Ralene Muskrat MD

## 2019-09-03 ENCOUNTER — Inpatient Hospital Stay: Payer: Medicare Other

## 2019-09-03 LAB — CBC
HCT: 41 % (ref 36.0–46.0)
Hemoglobin: 12.8 g/dL (ref 12.0–15.0)
MCH: 27.9 pg (ref 26.0–34.0)
MCHC: 31.2 g/dL (ref 30.0–36.0)
MCV: 89.5 fL (ref 80.0–100.0)
Platelets: 275 10*3/uL (ref 150–400)
RBC: 4.58 MIL/uL (ref 3.87–5.11)
RDW: 15.3 % (ref 11.5–15.5)
WBC: 23.6 10*3/uL — ABNORMAL HIGH (ref 4.0–10.5)
nRBC: 0 % (ref 0.0–0.2)

## 2019-09-03 LAB — BASIC METABOLIC PANEL
Anion gap: 9 (ref 5–15)
BUN: 38 mg/dL — ABNORMAL HIGH (ref 8–23)
CO2: 29 mmol/L (ref 22–32)
Calcium: 9.2 mg/dL (ref 8.9–10.3)
Chloride: 105 mmol/L (ref 98–111)
Creatinine, Ser: 1.14 mg/dL — ABNORMAL HIGH (ref 0.44–1.00)
GFR calc Af Amer: 51 mL/min — ABNORMAL LOW (ref >60)
GFR calc non Af Amer: 44 mL/min — ABNORMAL LOW (ref >60)
Glucose, Bld: 147 mg/dL — ABNORMAL HIGH (ref 70–99)
Potassium: 4.1 mmol/L (ref 3.5–5.1)
Sodium: 143 mmol/L (ref 135–145)

## 2019-09-03 MED ORDER — FUROSEMIDE 10 MG/ML IJ SOLN
40.0000 mg | Freq: Once | INTRAMUSCULAR | Status: AC
  Administered 2019-09-03: 40 mg via INTRAVENOUS
  Filled 2019-09-03: qty 4

## 2019-09-03 MED ORDER — METRONIDAZOLE IN NACL 5-0.79 MG/ML-% IV SOLN
500.0000 mg | Freq: Three times a day (TID) | INTRAVENOUS | Status: DC
  Administered 2019-09-03 – 2019-09-04 (×4): 500 mg via INTRAVENOUS
  Filled 2019-09-03 (×6): qty 100

## 2019-09-03 MED ORDER — CIPROFLOXACIN IN D5W 400 MG/200ML IV SOLN
400.0000 mg | Freq: Two times a day (BID) | INTRAVENOUS | Status: DC
  Administered 2019-09-03 – 2019-09-04 (×3): 400 mg via INTRAVENOUS
  Filled 2019-09-03 (×5): qty 200

## 2019-09-03 NOTE — Progress Notes (Addendum)
PROGRESS NOTE    Diana Hawkins  [id number] DOB: 1931/09/17 DOA: 08/05/2019 PCP: Gala Romney, MD   Brief Narrative:  Briefly this is a 86 year old female with a known history of severe constipation and colonic dilatation who came in with chief complaint of severe constipation/obstipation.  Surgery was consulted from the emergency department given the massive colonic dilatation seen on imaging.  Patient with recurrent vomiting on presentation.  NG tube was placed and is in place at time of my evaluation.  Per nursing documentation patient has had several bowel movements however repeat KUB this morning continues to demonstrate severe colonic stool burden.  Vital signs are stable at time of my evaluation however patient appears extremely weak and deconditioned.  NG tube in place.  Belly distended.  Surprisingly minimal pain on palpation.  We will continue with aggressive scheduled enema regimen in addition to oral bowel regimen.  General surgery following.  At this time no indication for emergent surgical intervention however patient remains high risk for surgical intervention during this admission.   1/30: Patient seen and examined.  Self discontinued NG tube.  Refused replacement.  Not nauseous or vomiting this morning.  Relates remains distended however pain improved.  Seen by surgery.  No surgical intervention warranted at this time.  Diet advanced to liquids per surgery.   Assessment & Plan:   Active Problems:   Constipation   Obstipation  Severe constipation with subsequent massive bowel dilatation,  concerning for impaction with stercoral colitis Intractable nausea and vomiting Patient with extensive medical and surgical history including colon cancer status post resection Has had issues with chronic stool retention and constipation as well as colonic dilatation Per surgery imaging is consistent with prior CT 2 years ago Initially patient had NG tube placed, self  discontinued Not nauseous or vomiting at this moment, no indication to replace Advance diet to liquids Continue enema regimen Continue p.o. bowel regimen Pain control as needed Antiemetics as needed Empiric antibiotics, ciprofloxacin and Flagyl given worsening leukocytosis and concern for stercoral colitis  Leukocytosis. Likely secondary to severe constipation and recurrent vomiting Acute increase to 23 today We will start empiric ciprofloxacin and Flagyl given concern for stercoral colitis   DVT prophylaxis: Lovenox Code Status: DNR Family Communication: called daughter Dois Davenport [phone]).  No answer, left VM Disposition Plan: Anticipate skilled nursing facility 48 to 72 hours, pending resolution  Consultants:   Surgery  Procedures:   NG tube placement  Antimicrobials:   Ciprofloxacin (09/03/2019-  )  Flagyl (09/03/19-   )   Subjective: Seen and examined Discontinued NG tube, refused replacement Pain control improved Belly remains distended  Objective: Vitals:   08/16/2019 0833 08/24/2019 1300 08/30/2019 1950 09/03/19 0507  BP: (!) 159/69 134/72 (!) 111/57 135/72  Pulse: 74 100 77 79  Resp:  20 20 20   Temp: 98.4 F (36.9 C) 98.3 F (36.8 C) 98.3 F (36.8 C) (!) 97.4 F (36.3 C)  TempSrc: Oral Oral Oral Oral  SpO2: 99% 95% 90% 96%  Weight:      Height:        Intake/Output Summary (Last 24 hours) at 09/03/2019 1156 Last data filed at 09/03/2019 1100 Gross per 24 hour  Intake 2063.25 ml  Output 430 ml  Net 1633.25 ml   Filed Weights   08/20/2019 0139  Weight: 55.8 kg    Examination:  General exam: Appears chronically ill Respiratory system: Normal work of breathing, bibasilar crackles Cardiovascular system: S1-S2, RRR, no murmurs.  No pedal  edema gastrointestinal system: Distended, hypoactive bowel sounds, tender to palpation Central nervous system: Alert and oriented. No focal neurological deficits. Extremities: Deep restraints  bilateral, Skin: No rashes, lesions or ulcers Psychiatry: Judgement and insight appear normal. Mood & affect appropriate.     Data Reviewed: I have personally reviewed following labs and imaging studies  CBC: Recent Labs  Lab 09/01/19 0414 08/26/2019 0201 09/03/19 0524  WBC 10.7* 14.4* 23.6*  NEUTROABS  --  12.6*  --   HGB 13.7 14.5 12.8  HCT 43.1 45.6 41.0  MCV 88.0 87.2 89.5  PLT 292 295 [id number]   Basic Metabolic Panel: Recent Labs  Lab 09/01/19 0414 08/09/2019 0201 09/03/19 0524  NA 140 143 143  K 3.8 3.8 4.1  CL 102 101 105  CO2 32 31 29  GLUCOSE 113* 131* 147*  BUN 26* 29* 38*  CREATININE 0.86 0.97 1.14*  CALCIUM 9.5 9.8 9.2  MG  --  2.4  --    GFR: Estimated Creatinine Clearance: 31.8 mL/min (A) (by C-G formula based on SCr of 1.14 mg/dL (H)). Liver Function Tests: Recent Labs  Lab 09/01/19 0414 08/20/2019 0201  AST 21 22  ALT 16 17  ALKPHOS 62 79  BILITOT 0.5 0.8  PROT 6.7 7.6  ALBUMIN 3.6 4.1   No results for input(s): LIPASE, AMYLASE in the last 168 hours. No results for input(s): AMMONIA in the last 168 hours. Coagulation Profile: No results for input(s): INR, PROTIME in the last 168 hours. Cardiac Enzymes: No results for input(s): CKTOTAL, CKMB, CKMBINDEX, TROPONINI in the last 168 hours. BNP (last 3 results) No results for input(s): PROBNP in the last 8760 hours. HbA1C: No results for input(s): HGBA1C in the last 72 hours. CBG: No results for input(s): GLUCAP in the last 168 hours. Lipid Profile: No results for input(s): CHOL, HDL, LDLCALC, TRIG, CHOLHDL, LDLDIRECT in the last 72 hours. Thyroid Function Tests: Recent Labs    08/11/2019 0201  TSH 1.487   Anemia Panel: No results for input(s): VITAMINB12, FOLATE, FERRITIN, TIBC, IRON, RETICCTPCT in the last 72 hours. Sepsis Labs: Recent Labs  Lab 08/06/2019 0201 08/06/2019 0409  PROCALCITON <0.10  --   LATICACIDVEN  --  1.3    Recent Results (from the past 240 hour(s))  SARS CORONAVIRUS 2  (TAT 6-24 HRS) Nasopharyngeal Nasopharyngeal Swab     Status: None   Collection Time: 08/27/2019  4:09 AM   Specimen: Nasopharyngeal Swab  Result Value Ref Range Status   SARS Coronavirus 2 NEGATIVE NEGATIVE Final    Comment: (NOTE) SARS-CoV-2 target nucleic acids are NOT DETECTED. The SARS-CoV-2 RNA is generally detectable in upper and lower respiratory specimens during the acute phase of infection. Negative results do not preclude SARS-CoV-2 infection, do not rule out co-infections with other pathogens, and should not be used as the sole basis for treatment or other patient management decisions. Negative results must be combined with clinical observations, patient history, and epidemiological information. The expected result is Negative. Fact Sheet for Patients: SugarRoll.be Fact Sheet for Healthcare Providers: https://www.woods-mathews.com/ This test is not yet approved or cleared by the [place] FDA and  has been authorized for detection and/or diagnosis of SARS-CoV-2 by FDA under an Emergency Use Authorization (EUA). This EUA will remain  in effect (meaning this test can be used) for the duration of the COVID-19 declaration under Section 56 4(b)(1) of the Act, 21 U.S.C. section 360bbb-3(b)(1), unless the authorization is terminated or revoked sooner. Performed at [facility], [address] [place], [place] [address]          Radiology Studies: DG Abdomen 1 View  Result Date: 08/24/2019 CLINICAL DATA:  NG tube evaluation. EXAM: ABDOMEN - 1 VIEW COMPARISON:  CT 08/08/2019.  KUB 09/01/2019 FINDINGS: NG tube noted with its tip and side hole projected over the stomach. Surgical clips right upper quadrant. Severe colonic distention with large amount of stool again noted. No free air identified. Elevation of left hemidiaphragm noted. IMPRESSION: 1.  NG tube noted with its tip and side hole project over stomach. 2. Severe colonic  distention with large amount of stool again noted. Reference is made to prior CT report of 08/08/2019. Electronically Signed   By: Marcello Moores  Register   On: 09/03/2019 06:21   CT ABDOMEN PELVIS W CONTRAST  Result Date: 08/24/2019 CLINICAL DATA:  Bowel obstruction suspected, constipation and distention EXAM: CT ABDOMEN AND PELVIS WITH CONTRAST TECHNIQUE: Multidetector CT imaging of the abdomen and pelvis was performed using the standard protocol following bolus administration of intravenous contrast. CONTRAST:  148mL OMNIPAQUE IOHEXOL 300 MG/ML  SOLN COMPARISON:  CT abdomen pelvis 08/24/2018, abdominal radiograph 09/01/2019 FINDINGS: Lower chest: Air cyst seen in the left lung base. Basilar atelectatic changes present as well. Mild cardiomegaly with slight mass effect on the right heart by a severe pectus deformity of the chest (Haller index of 4.1). Hepatobiliary: There is a bilobed cyst in the left lobe liver measuring approximately 1.4 cm in size which is unchanged from comparison study (2/14). There is mild intra and extrahepatic biliary ductal dilatation which may be related to senescent change in the absence of visualized gallstones in either the gallbladder or biliary tree. No pericholecystic inflammation is seen. Pancreas: Limited visualization due to marked compression by the massively distended colon and rectum. Spleen: Normal in size without focal abnormality. Adrenals/Urinary Tract: Nodular thickening of the adrenal glands may reflect some mild senescent hyperplasia without dominant or concerning adrenal lesion. 1.7 cm fluid attenuation cyst is present in the inferior pole right kidney, unchanged from prior. Additional subcentimeter hypoattenuating foci in both kidneys are too small to fully characterize on CT imaging but statistically likely benign. No obstructive urolithiasis or hydronephrosis. Bladder is significantly indented posteriorly by the massively distended colon and rectum. Stomach/Bowel:  Distal esophagus is circumferentially thickened with adjacent stranding and mucosal hyperemia. The stomach is markedly distended with ingested material. Much of the small bowel is compressed by the massively enlarged stool filled colon. There are postsurgical changes in the right hemiabdomen likely reflecting prior right hemicolectomy and ileocolic anastomosis. The proximal colonic segments are largely air distended with minimal formed stool. The sigmoid and rectum are massively distended, the rectum measuring up to 14 cm in maximal diameter with large amount of inspissated stool and circumferential mural thickening with adjacent Peri colonic and perirectal fat stranding. No evidence of pneumatosis at this time. Some hyperdensity within the mural thickening of the colon and rectum could reflect early venous engorgement. Vascular/Lymphatic: Atherosclerotic plaque within the normal caliber aorta. Reproductive: Uterus is surgically absent. No concerning adnexal lesions. No visible adnexal lesions though the low pelvis is distorted by marked colonic distention. Other: Some reactive free fluid is noted in the pelvis with extensive stranding about the colon and rectum. No free air. Mild circumferential body wall edema. Musculoskeletal: The osseous structures appear diffusely demineralized which may limit detection of small or nondisplaced fractures. Multilevel degenerative changes are present in the imaged portions of the spine. Extensive fatty marrow changes noted throughout the  pelvic girdle and proximal femora. Sclerotic changes of the femoral heads the hand IMPRESSION: 1. Massively distended colon and rectum with large amount of inspissated stool and circumferential mural thickening with adjacent fat stranding. Findings are concerning for impaction with stercoral colitis. No evidence of pneumatosis at this time though hyperemia of the bowel wall which could be inflammatory or suggest early features of pressure  necrosis given the degree of distention. Urgent surgical consultation is warranted. 2. Circumferential thickening of the distal esophagus with adjacent stranding and mucosal hyperemia may reflect esophagitis. Correlate with patient's symptoms. Critical Value/emergent results were called by telephone at the time of interpretation on 08/23/2019 at 3:40 am to provider [facility] , who verbally acknowledged these results. Electronically Signed   By: Lovena Le M.D.   On: 08/15/2019 03:40   DG Abd 2 Views  Result Date: 08/06/2019 CLINICAL DATA:  Abdominal distension. NG tube placement. EXAM: ABDOMEN - 2 VIEW COMPARISON:  Recent radiographs and CT scan. FINDINGS: The NG tube tip is in the body region of the stomach. Persistent massive distention of the rectum and sigmoid colon with stool. Moderate persistent air throughout the small bowel and colon. No free air. Contrast is noted in the renal collecting systems and in the bladder from a recent CT scan. IMPRESSION: Persistent massive distention of the rectum and sigmoid colon with stool. NG tube in good position with its tip in the body region of the stomach. Electronically Signed   By: Marijo Sanes M.D.   On: 09/04/2019 10:34        Scheduled Meds: . acidophilus  1 capsule Oral QPM  . colchicine  0.6 mg Oral BID  . enoxaparin (LOVENOX) injection  40 mg Subcutaneous Q24H  . fluticasone  1 spray Each Nare BID  . gabapentin  200 mg Oral QHS  . lactase  18,000 Units Oral BID  . lactulose  30 g Oral TID  . liver oil-zinc oxide  1 application Topical TID  . magnesium citrate  1 Bottle Oral Once  . mirtazapine  15 mg Oral QHS  . multivitamin with minerals  1 tablet Oral Daily  . polyethylene glycol  17 g Oral Daily  . potassium chloride  10 mEq Oral Daily  . psyllium  1 packet Oral Daily  . simethicone  80 mg Oral BID   Continuous Infusions: . sodium chloride 20 mL/hr at 09/03/19 1100     LOS: 1 day    Time spent: 35 minutes    Sidney Ace, MD [facility] Pager [phone]  If 7PM-7AM, please contact night-coverage [email or web address] Password [facility] 09/03/2019, 11:56 AM

## 2019-09-03 NOTE — Plan of Care (Signed)
Continuing with plan of care. 

## 2019-09-03 NOTE — Progress Notes (Signed)
[facility] Hospital Day(s): 1.   Post op day(s):  Marland Kitchen   Interval History: Patient seen and examined, no acute events or new complaints overnight. Patient reports feeling better today. Patient removed NGT last night. Today no nausea. No pain. No pain radiation. No alleviating or aggravating factor. Patient has had two large bowel movement yesterday and one this morning.   Vital signs in last 24 hours: [min-max] current  Temp:  [97.4 F (36.3 C)-98.3 F (36.8 C)] 97.4 F (36.3 C) (01/30 0507) Pulse Rate:  [77-100] 79 (01/30 0507) Resp:  [20] 20 (01/30 0507) BP: (111-135)/(57-72) 135/72 (01/30 0507) SpO2:  [90 %-96 %] 96 % (01/30 0507)     Height: 5\' 5"  (165.1 cm) Weight: 55.8 kg BMI (Calculated): 20.47   Physical Exam:  Constitutional: alert, cooperative and no distress  Respiratory: breathing non-labored at rest  Cardiovascular: regular rate and sinus rhythm  Gastrointestinal: soft, non-tender, and less-distended (chronic)  Labs:  CBC Latest Ref Rng & Units 09/03/2019 08/25/2019 09/01/2019  WBC 4.0 - 10.5 K/uL 23.6(H) 14.4(H) 10.7(H)  Hemoglobin 12.0 - 15.0 g/dL 12.8 14.5 13.7  Hematocrit 36.0 - 46.0 % 41.0 45.6 43.1  Platelets 150 - 400 K/uL 275 295 292   CMP Latest Ref Rng & Units 09/03/2019 08/17/2019 09/01/2019  Glucose 70 - 99 mg/dL 147(H) 131(H) 113(H)  BUN 8 - 23 mg/dL 38(H) 29(H) 26(H)  Creatinine 0.44 - 1.00 mg/dL 1.14(H) 0.97 0.86  Sodium 135 - 145 mmol/L 143 143 140  Potassium 3.5 - 5.1 mmol/L 4.1 3.8 3.8  Chloride 98 - 111 mmol/L 105 101 102  CO2 22 - 32 mmol/L 29 31 32  Calcium 8.9 - 10.3 mg/dL 9.2 9.8 9.5  Total Protein 6.5 - 8.1 g/dL - 7.6 6.7  Total Bilirubin 0.3 - 1.2 mg/dL - 0.8 0.5  Alkaline Phos 38 - 126 U/L - 79 62  AST 15 - 41 U/L - 22 21  ALT 0 - 44 U/L - 17 16    Imaging studies: No new pertinent imaging studies   Assessment/Plan:  86 y.o. female with fecal impaction due to chronic severe constipation, complicated by pertinent  comorbidities including advanced age, dementia, hypertension, history of stroke, depressive disorder, history of colon cancer with partial colectomy and severe chronic constipation.  Patient slowly improving having regular bowel movement. The pain and the nausea has improved significantly. Since patient removed NGT, agree with starting with clear liquid and than advance as tolerated. Continue enemas and aggressive bowel regiment as needed. When patient goes home, ideally she should have enemas 2-3 times per week. No surgical indications at this moment.   Arnold Long, MD

## 2019-09-03 NOTE — Progress Notes (Signed)
Patient pulled out NG tube and refused replacement

## 2019-09-03 NOTE — Progress Notes (Signed)
Patient's oxygen saturation down to 81% on RA, placed on [place] at 2L and oxygen saturation up to 88%, [place] up to 4L and patient was at 90%, after a few minutes patient was able to hold oxygen saturation at 92% on 3L [place].  Lung sound clear on auscultation.  Dr. Priscella Mann notified and ordered to discontinue IV fluids and portable 2 view chest x-ray.  Will continue to monitor patient.

## 2019-09-04 ENCOUNTER — Inpatient Hospital Stay: Payer: Medicare Other

## 2019-09-04 LAB — BLOOD GAS, ARTERIAL
Acid-Base Excess: 0.5 mmol/L (ref 0.0–2.0)
Bicarbonate: 30.3 mmol/L — ABNORMAL HIGH (ref 20.0–28.0)
FIO2: 100
O2 Saturation: 75.4 %
Patient temperature: 37
pCO2 arterial: 74 mmHg (ref 32.0–48.0)
pH, Arterial: 7.22 — ABNORMAL LOW (ref 7.350–7.450)
pO2, Arterial: 49 mmHg — ABNORMAL LOW (ref 83.0–108.0)

## 2019-09-04 LAB — CBC WITH DIFFERENTIAL/PLATELET
Abs Immature Granulocytes: 0.03 10*3/uL (ref 0.00–0.07)
Basophils Absolute: 0 10*3/uL (ref 0.0–0.1)
Basophils Relative: 0 %
Eosinophils Absolute: 0.2 10*3/uL (ref 0.0–0.5)
Eosinophils Relative: 1 %
HCT: 42.5 % (ref 36.0–46.0)
Hemoglobin: 13.5 g/dL (ref 12.0–15.0)
Immature Granulocytes: 0 %
Lymphocytes Relative: 6 %
Lymphs Abs: 0.8 10*3/uL (ref 0.7–4.0)
MCH: 27.8 pg (ref 26.0–34.0)
MCHC: 31.8 g/dL (ref 30.0–36.0)
MCV: 87.6 fL (ref 80.0–100.0)
Monocytes Absolute: 0.9 10*3/uL (ref 0.1–1.0)
Monocytes Relative: 7 %
Neutro Abs: 11 10*3/uL — ABNORMAL HIGH (ref 1.7–7.7)
Neutrophils Relative %: 86 %
Platelets: 267 10*3/uL (ref 150–400)
RBC: 4.85 MIL/uL (ref 3.87–5.11)
RDW: 15.1 % (ref 11.5–15.5)
Smear Review: ADEQUATE
WBC: 12.8 10*3/uL — ABNORMAL HIGH (ref 4.0–10.5)
nRBC: 0 % (ref 0.0–0.2)

## 2019-09-04 LAB — BASIC METABOLIC PANEL
Anion gap: 10 (ref 5–15)
BUN: 35 mg/dL — ABNORMAL HIGH (ref 8–23)
CO2: 30 mmol/L (ref 22–32)
Calcium: 8.9 mg/dL (ref 8.9–10.3)
Chloride: 104 mmol/L (ref 98–111)
Creatinine, Ser: 0.8 mg/dL (ref 0.44–1.00)
GFR calc Af Amer: >60 mL/min (ref >60)
GFR calc non Af Amer: >60 mL/min (ref >60)
Glucose, Bld: 144 mg/dL — ABNORMAL HIGH (ref 70–99)
Potassium: 3.1 mmol/L — ABNORMAL LOW (ref 3.5–5.1)
Sodium: 144 mmol/L (ref 135–145)

## 2019-09-04 LAB — GLUCOSE, CAPILLARY: Glucose-Capillary: 172 mg/dL — ABNORMAL HIGH (ref 70–99)

## 2019-09-04 MED ORDER — ENSURE ENLIVE PO LIQD
237.0000 mL | Freq: Two times a day (BID) | ORAL | Status: DC
  Administered 2019-09-04: 237 mL via ORAL

## 2019-09-04 MED ORDER — MORPHINE SULFATE (PF) 2 MG/ML IV SOLN: 2.0000 mg | INTRAVENOUS | Status: DC | PRN

## 2019-09-04 MED ORDER — POTASSIUM CHLORIDE CRYS ER 20 MEQ PO TBCR
40.0000 meq | EXTENDED_RELEASE_TABLET | Freq: Every day | ORAL | Status: DC
  Administered 2019-09-04: 16:00:00 40 meq via ORAL
  Filled 2019-09-04: qty 2

## 2019-09-04 MED ORDER — FUROSEMIDE 10 MG/ML IJ SOLN
20.0000 mg | Freq: Once | INTRAMUSCULAR | Status: AC
  Administered 2019-09-04: 20 mg via INTRAVENOUS
  Filled 2019-09-04: qty 4

## 2019-09-04 NOTE — Progress Notes (Signed)
   09/04/19 1055  Clinical Encounter Type  Visited With Patient  Visit Type Initial  Referral From Nurse  Consult/Referral To Chaplain  Spiritual Encounters  Spiritual Needs Prayer;Ritual;Emotional  [facility] received page at 1055 for code response. CH saw several members of care team providing care for pt. Pt was declining rapidly. NP contacted pt's daughter Darrick Penna. Daughter requesting that this [place] share message with pt and pray. Chief Strategy Officer provided pastoral care for patient and daughter as instructed. Pt was declining rapidly upon arrival. Follow up visit may be required.

## 2019-09-04 NOTE — Plan of Care (Signed)
Continuing with plan of care. 

## 2019-09-04 NOTE — Progress Notes (Signed)
Physical Therapy Evaluation Patient Details Name: Diana Hawkins MRN: [id number] DOB: 08-24-1931 Today's Date: 09/04/2019   History of Present Illness  Per MD note:Briauna Wolfenden  is a 86 y.o. pleasant Caucasian female with a known history of hypertension, anemia, depression and CVA, presented to the emergency room with acute onset of persistent constipation with obstipation for the last few days.  She had associated vomiting yesterday.  She came to the ER from her assisted living facility yesterday and was managed for constipation abdominal distention with disimpaction and enema and that was her last bowel movement.  She continued to have persistent constipation after that with inability to take p.o. in addition to vomiting IV contrast that was given in the ER.  Clinical Impression  Patient needs max assist for bed mobility and has weakness in BLE . She did not attempt transfers as she needed to be cleaned up from having BM and stool on gown/bed covers and patient. She needed assist with transfers at [facility] and will most likely need SNF at DC. Patient will benefit from skilled PT to assess transfers at later date if able.      Follow Up Recommendations SNF    Equipment Recommendations       Recommendations for Other Services       Precautions / Restrictions Precautions Precautions: None Restrictions Weight Bearing Restrictions: No      Mobility  Bed Mobility Overal bed mobility: Needs Assistance Bed Mobility: Rolling;Sidelying to Sit;Supine to Sit;Sit to Supine;Sit to Sidelying Rolling: Max assist Sidelying to sit: Max assist Supine to sit: Max assist Sit to supine: Max assist Sit to sidelying: Max assist General bed mobility comments: (needs max assist for all mobiltiy)  Transfers Overall transfer level: (deferred due to having stool/ BM in bed)                  Ambulation/Gait                Stairs            Wheelchair Mobility    Modified Rankin  (Stroke Patients Only)       Balance                                             Pertinent Vitals/Pain Pain Assessment: No/denies pain    Home Living Family/patient expects to be discharged to:: Assisted living                 Additional Comments: (Patient reports that she needed total assist for mobility)    Prior Function Level of Independence: Needs assistance   Gait / Transfers Assistance Needed: (total assist for transfers and non ambulatory)           Hand Dominance        Extremity/Trunk Assessment        Lower Extremity Assessment Lower Extremity Assessment: Generalized weakness(3/5 BLE hip flex,)       Communication   Communication: No difficulties  Cognition Arousal/Alertness: Awake/alert Behavior During Therapy: WFL for tasks assessed/performed;Flat affect Overall Cognitive Status: Within Functional Limits for tasks assessed                                        General Comments  Exercises     Assessment/Plan    PT Assessment    PT Problem List         PT Treatment Interventions      PT Goals (Current goals can be found in the Care Plan section)  Acute Rehab PT Goals Patient Stated Goal: no goals stated PT Goal Formulation: Patient unable to participate in goal setting Time For Goal Achievement: 09/18/19 Potential to Achieve Goals: Fair    Frequency     Barriers to discharge        Co-evaluation               AM-PAC PT "6 Clicks" Mobility  Outcome Measure Help needed turning from your back to your side while in a flat bed without using bedrails?: Total Help needed moving from lying on your back to sitting on the side of a flat bed without using bedrails?: Total Help needed moving to and from a bed to a chair (including a wheelchair)?: Total Help needed standing up from a chair using your arms (e.g., wheelchair or bedside chair)?: Total Help needed to walk in hospital  room?: Total Help needed climbing 3-5 steps with a railing? : Total 6 Click Score: 6    End of Session   Activity Tolerance: Other (comment) Patient left: in bed;with bed alarm set;Other (comment) Nurse Communication: (nurse in room and aware of patient needing to be cleaned up) PT Visit Diagnosis: Muscle weakness (generalized) (M62.81)    Time: [id number] PT Time Calculation (min) (ACUTE ONLY): 15 min   Charges:   PT Evaluation $PT Eval Low Complexity: 1 Low            , Sherryl Barters, PT DPT 09/04/2019, 10:06 AM

## 2019-09-04 NOTE — Progress Notes (Signed)
Found pt lying in bed unresponsive. Rapid response was called, and Rufina Falco, NP notified.

## 2019-09-04 NOTE — Progress Notes (Signed)
Initial Nutrition Assessment  DOCUMENTATION CODES:   Not applicable  INTERVENTION:   Ensure Enlive po BID, each supplement provides 350 kcal and 20 grams of protein  MVI daily   NUTRITION DIAGNOSIS:   Inadequate oral intake related to acute illness(constipation, nausea and vomiting) as evidenced by meal completion < 50%.  GOAL:   Patient will meet greater than or equal to 90% of their needs  MONITOR:   PO intake, Supplement acceptance, Labs, Weight trends, Skin, I & O's  REASON FOR ASSESSMENT:   Malnutrition Screening Tool    ASSESSMENT:   86 year old female with a known history of MDD, SAH, anemia and severe constipation and colonic dilatation who is admitted with severe constipation/obstipation.  RD working remotely.  Unable to reach pt via phone. Pt with poor appetite and oral intake for several days pta r/t nausea, vomiting and abdominal pain. Pt removed NGT and refused replacement. Pt eating ~50% of meals in hospital; pt with improved nausea and abdominal distension today per MD note. RD will add supplements to help pt meet her estimated needs; supplements are lactose free as pt is noted to be on lactase. Per chart, pt is weight stable pta. Of note, pt is on metamucil; this is a soluble fiber that can worsen constipation.    Pt at high risk for malnutrition but unable to diagnose at this time as NFPE cannot be performed.   Medications reviewed and include: riasquad, lovenox, lactase, lactulose, remeron, MVI, Miralax, KCl, psyllium, simethicone, ciprofloxacin, metronidazole    Labs reviewed: K 3.1(L), BUN 35(H)  Unable to complete Nutrition-Focused physical exam at this time.   Diet Order:   Diet Order            Diet full liquid Room service appropriate? Yes; Fluid consistency: Thin  Diet effective now             EDUCATION NEEDS:   No education needs have been identified at this time  Skin:  Skin Assessment: Reviewed RN Assessment  Last BM:  1/31-  type 6  Height:   Ht Readings from Last 1 Encounters:  08/28/2019 5\' 5"  (1.651 m)    Weight:   Wt Readings from Last 1 Encounters:  08/17/2019 55.8 kg    Ideal Body Weight:  56.8 kg  BMI:  Body mass index is 20.47 kg/m.  Estimated Nutritional Needs:   Kcal:  1400-1600kcal/day  Protein:  70-80g/day  Fluid:  >1.4L/day  Koleen Distance MS, RD, LDN Pager #- [phone] Office#- [phone] After Hours Pager: [phone]

## 2019-09-04 NOTE — Progress Notes (Signed)
Subjective:  CC: Diana Hawkins is a 86 y.o. female  Hospital stay day 2,   constipation  HPI: No issues reported overnight. Pt states she is feeling better, continues to report BM.  ROS:  Unable to obtain due to patient slow mentation  Objective:   Temp:  [98.1 F (36.7 C)-98.3 F (36.8 C)] 98.3 F (36.8 C) (01/31 1228) Pulse Rate:  [77-86] 77 (01/31 1228) Resp:  [16-20] 18 (01/31 1228) BP: (131-146)/(62-73) 146/72 (01/31 1228) SpO2:  [93 %-94 %] 93 % (01/31 1228)     Height: 5\' 5"  (165.1 cm) Weight: 55.8 kg BMI (Calculated): 20.47   Intake/Output this shift:   Intake/Output Summary (Last 24 hours) at 09/04/2019 1359 Last data filed at 09/04/2019 1100 Gross per 24 hour  Intake 1109.35 ml  Output 400 ml  Net 709.35 ml    Constitutional :  cooperative and appears stated age  Respiratory:  clear to auscultation bilaterally  Cardiovascular:  regular rate and rhythm  Gastrointestinal: soft, no guarding, distended but non-tender.   Skin: Cool and moist.   Psychiatric: Normal affect, non-agitated, not confused       LABS:  CMP Latest Ref Rng & Units 09/04/2019 09/03/2019 08/24/2019  Glucose 70 - 99 mg/dL 144(H) 147(H) 131(H)  BUN 8 - 23 mg/dL 35(H) 38(H) 29(H)  Creatinine 0.44 - 1.00 mg/dL 0.80 1.14(H) 0.97  Sodium 135 - 145 mmol/L 144 143 143  Potassium 3.5 - 5.1 mmol/L 3.1(L) 4.1 3.8  Chloride 98 - 111 mmol/L 104 105 101  CO2 22 - 32 mmol/L 30 29 31   Calcium 8.9 - 10.3 mg/dL 8.9 9.2 9.8  Total Protein 6.5 - 8.1 g/dL - - 7.6  Total Bilirubin 0.3 - 1.2 mg/dL - - 0.8  Alkaline Phos 38 - 126 U/L - - 79  AST 15 - 41 U/L - - 22  ALT 0 - 44 U/L - - 17   CBC Latest Ref Rng & Units 09/04/2019 09/03/2019 08/17/2019  WBC 4.0 - 10.5 K/uL 12.8(H) 23.6(H) 14.4(H)  Hemoglobin 12.0 - 15.0 g/dL 13.5 12.8 14.5  Hematocrit 36.0 - 46.0 % 42.5 41.0 45.6  Platelets 150 - 400 K/uL 267 275 295    RADS: n/a Assessment:   Constipation, resolving with currrent bowel regimen. Tolerating  diet.  Advance as tolerated and hopefully d/c with good bowel regimen and monitoring program to prevent recurrence.  Further care per primary team

## 2019-09-04 NOTE — Progress Notes (Signed)
PROGRESS NOTE    Diana Hawkins  [id number] DOB: 04-24-1932 DOA: 08/31/2019 PCP: Gala Romney, MD   Brief Narrative:  Briefly this is a 86 year old female with a known history of severe constipation and colonic dilatation who came in with chief complaint of severe constipation/obstipation.  Surgery was consulted from the emergency department given the massive colonic dilatation seen on imaging.  Patient with recurrent vomiting on presentation.  NG tube was placed and is in place at time of my evaluation.  Per nursing documentation patient has had several bowel movements however repeat KUB this morning continues to demonstrate severe colonic stool burden.  Vital signs are stable at time of my evaluation however patient appears extremely weak and deconditioned.  NG tube in place.  Belly distended.  Surprisingly minimal pain on palpation.  We will continue with aggressive scheduled enema regimen in addition to oral bowel regimen.  General surgery following.  At this time no indication for emergent surgical intervention however patient remains high risk for surgical intervention during this admission.   1/30: Patient seen and examined.  Self discontinued NG tube.  Refused replacement.  Not nauseous or vomiting this morning.  Relates remains distended however pain improved.  Seen by surgery.  No surgical intervention warranted at this time.  Diet advanced to liquids per surgery.  1/31: Patient seen and examined.  No nausea or vomiting noted.  Continued bowel movements.  Bili remains distended but improving.  No surgical intervention at this time.  Seems to be tolerating full liquid diet   Assessment & Plan:   Active Problems:   Constipation   Obstipation  Severe constipation with subsequent massive bowel dilatation,  concerning for impaction with stercoral colitis Intractable nausea and vomiting Patient with extensive medical and surgical history including colon cancer status post  resection Has had issues with chronic stool retention and constipation as well as colonic dilatation Per surgery imaging is consistent with prior CT 2 years ago Initially patient had NG tube placed, self discontinued Not nauseous or vomiting at this moment, no indication to replace Continue full liquids, advance as tolerated Continue enema regimen Continue p.o. bowel regimen Pain control as needed Antiemetics as needed Continue empiric ciprofloxacin and Flagyl, marked improvement in white count noted  Leukocytosis. Improving, likely secondary to severe constipation with current vomiting Continue Cipro Flagyl for empiric stercoral colitis coverage   DVT prophylaxis: Lovenox Code Status: DNR Family Communication: called daughter Dois Davenport [phone]). Updated on POC.  Daughter ok with SNF placement  Disposition Plan: Anticipate skilled nursing facility 48 to 72 hours, pending resolution of severe constipation  Consultants:   Surgery  Procedures:   NG tube placement  Antimicrobials:   Ciprofloxacin (09/03/2019-  )  Flagyl (09/03/19-   )   Subjective: Seen and examined Belly remains distended Pain improved  Objective: Vitals:   09/03/19 1302 09/03/19 2043 09/04/19 0458 09/04/19 1228  BP:  131/62 (!) 142/73 (!) 146/72  Pulse:  86 81 77  Resp:  16 20 18   Temp:  98.3 F (36.8 C) 98.1 F (36.7 C) 98.3 F (36.8 C)  TempSrc:  Oral Oral Oral  SpO2: 92% 94% 94% 93%  Weight:      Height:        Intake/Output Summary (Last 24 hours) at 09/04/2019 1302 Last data filed at 09/04/2019 1100 Gross per 24 hour  Intake 1109.35 ml  Output 400 ml  Net 709.35 ml   Filed Weights   08/18/2019 0139  Weight: 55.8 kg  Examination:  General exam: Appears chronically ill Respiratory system: Normal work of breathing, bibasilar crackles Cardiovascular system: S1-S2, RRR, no murmurs.  No pedal edema gastrointestinal system: Distended, hypoactive bowel sounds, tender to  palpation Central nervous system: Alert and oriented. No focal neurological deficits. Extremities: Deep restraints bilateral, Skin: No rashes, lesions or ulcers Psychiatry: Judgement and insight appear normal. Mood & affect appropriate.     Data Reviewed: I have personally reviewed following labs and imaging studies  CBC: Recent Labs  Lab 09/01/19 0414 08/09/2019 0201 09/03/19 0524 09/04/19 0819  WBC 10.7* 14.4* 23.6* 12.8*  NEUTROABS  --  12.6*  --  11.0*  HGB 13.7 14.5 12.8 13.5  HCT 43.1 45.6 41.0 42.5  MCV 88.0 87.2 89.5 87.6  PLT 292 295 275 [id number]   Basic Metabolic Panel: Recent Labs  Lab 09/01/19 0414 08/28/2019 0201 09/03/19 0524 09/04/19 0819  NA 140 143 143 144  K 3.8 3.8 4.1 3.1*  CL 102 101 105 104  CO2 32 31 29 30   GLUCOSE 113* 131* 147* 144*  BUN 26* 29* 38* 35*  CREATININE 0.86 0.97 1.14* 0.80  CALCIUM 9.5 9.8 9.2 8.9  MG  --  2.4  --   --    GFR: Estimated Creatinine Clearance: 45.3 mL/min (by C-G formula based on SCr of 0.8 mg/dL). Liver Function Tests: Recent Labs  Lab 09/01/19 0414 08/18/2019 0201  AST 21 22  ALT 16 17  ALKPHOS 62 79  BILITOT 0.5 0.8  PROT 6.7 7.6  ALBUMIN 3.6 4.1   No results for input(s): LIPASE, AMYLASE in the last 168 hours. No results for input(s): AMMONIA in the last 168 hours. Coagulation Profile: No results for input(s): INR, PROTIME in the last 168 hours. Cardiac Enzymes: No results for input(s): CKTOTAL, CKMB, CKMBINDEX, TROPONINI in the last 168 hours. BNP (last 3 results) No results for input(s): PROBNP in the last 8760 hours. HbA1C: No results for input(s): HGBA1C in the last 72 hours. CBG: No results for input(s): GLUCAP in the last 168 hours. Lipid Profile: No results for input(s): CHOL, HDL, LDLCALC, TRIG, CHOLHDL, LDLDIRECT in the last 72 hours. Thyroid Function Tests: Recent Labs    08/10/2019 0201  TSH 1.487   Anemia Panel: No results for input(s): VITAMINB12, FOLATE, FERRITIN, TIBC, IRON,  RETICCTPCT in the last 72 hours. Sepsis Labs: Recent Labs  Lab 08/19/2019 0201 08/07/2019 0409  PROCALCITON <0.10  --   LATICACIDVEN  --  1.3    Recent Results (from the past 240 hour(s))  SARS CORONAVIRUS 2 (TAT 6-24 HRS) Nasopharyngeal Nasopharyngeal Swab     Status: None   Collection Time: 08/28/2019  4:09 AM   Specimen: Nasopharyngeal Swab  Result Value Ref Range Status   SARS Coronavirus 2 NEGATIVE NEGATIVE Final    Comment: (NOTE) SARS-CoV-2 target nucleic acids are NOT DETECTED. The SARS-CoV-2 RNA is generally detectable in upper and lower respiratory specimens during the acute phase of infection. Negative results do not preclude SARS-CoV-2 infection, do not rule out co-infections with other pathogens, and should not be used as the sole basis for treatment or other patient management decisions. Negative results must be combined with clinical observations, patient history, and epidemiological information. The expected result is Negative. Fact Sheet for Patients: SugarRoll.be Fact Sheet for Healthcare Providers: https://www.woods-mathews.com/ This test is not yet approved or cleared by the [place] FDA and  has been authorized for detection and/or diagnosis of SARS-CoV-2 by FDA under an Emergency Use Authorization (EUA). This EUA will  remain  in effect (meaning this test can be used) for the duration of the COVID-19 declaration under Section 56 4(b)(1) of the Act, 21 U.S.C. section 360bbb-3(b)(1), unless the authorization is terminated or revoked sooner. Performed at [facility], [place] [address], [place], [place] [address]          Radiology Studies: DG Chest Port 1 View  Result Date: 09/03/2019 CLINICAL DATA:  Decreased oxygen saturation. EXAM: PORTABLE CHEST 1 VIEW COMPARISON:  10/05/2014 FINDINGS: Midline trachea. Mild cardiomegaly. Probable small left greater than right pleural effusions. No pneumothorax. Mild to  moderate pulmonary interstitial prominence. Left greater than right base airspace disease. Atherosclerosis in the transverse aorta. IMPRESSION: Cardiomegaly with interstitial prominence, suspicious for mild interstitial edema. Small bilateral pleural effusions with left greater than right base airspace disease, atelectasis or concurrent infection/aspiration. Electronically Signed   By: Abigail Miyamoto M.D.   On: 09/03/2019 13:33        Scheduled Meds: . acidophilus  1 capsule Oral QPM  . colchicine  0.6 mg Oral BID  . enoxaparin (LOVENOX) injection  40 mg Subcutaneous Q24H  . fluticasone  1 spray Each Nare BID  . furosemide  20 mg Intravenous Once  . gabapentin  200 mg Oral QHS  . lactase  18,000 Units Oral BID  . lactulose  30 g Oral TID  . liver oil-zinc oxide  1 application Topical TID  . magnesium citrate  1 Bottle Oral Once  . mirtazapine  15 mg Oral QHS  . multivitamin with minerals  1 tablet Oral Daily  . polyethylene glycol  17 g Oral Daily  . potassium chloride  10 mEq Oral Daily  . psyllium  1 packet Oral Daily  . simethicone  80 mg Oral BID   Continuous Infusions: . ciprofloxacin Stopped (09/04/19 0205)  . metronidazole Stopped (09/04/19 1054)     LOS: 2 days    Time spent: 35 minutes    Sidney Ace, MD [facility] Pager [phone]  If 7PM-7AM, please contact night-coverage 09/04/2019, 1:02 PM

## 2019-09-05 DEATH — deceased

## 2019-10-03 NOTE — Discharge Summary (Signed)
Discharge summary/death note  Patient was an 86 year old female with extensive past medical and surgical history who was admitted to the hospital and being treated for severe constipation/obstipation with concern for stercoral colitis.  Patient has no massive dilatation of the colon and has had years long issues with severe constipation.  She presented with belly distention and significant stool burden with concern for fecal impaction.  She was being treated with disimpaction, regular lactulose enemas, bowel regimen.  Patient was hemodynamically stable during the hospital stay.  Blood pressure heart rate and oxygen saturation are within normal limits.  Review at approximately 2300 on 09/04/2019 the patient was found in bed unresponsive per nursing documentation.  Rapid response initiated and cross cover NP was notified.  Per NP note on her arrival to the bedside the patient was afebrile with blood pressure 125/107, pulse of 78.  No focal neurologic deficits however the patient was attended and unresponsive on nonrebreather mask.  Stat chest x-ray showed consolidation worrisome for multifocal pneumonia.  Of note the patient was started on ciprofloxacin and Flagyl 48 hours prior due to concern for stercoral colitis.  ABG ordered stat demonstrating respiratory acidosis.  Initially the patient was to go to intensive care unit for airway management and continuation of noninvasive positive pressure ventilation however the patient's daughter was contacted who elected to pursue comfort measures.  Patient was placed on comfort care and passed away shortly thereafter.  Appreciate response from rapid response team and overnight cross cover. Time of death 0110 on 09-08-19  Ralene Muskrat MD

## 2019-10-03 NOTE — Progress Notes (Signed)
    BRIEF OVERNIGHT PROGRESS REPORT   SUBJECTIVE: Patient noted with worsening hypoxia 88-89% on 3L. Oxygen increased to 5 L and STAT chest xray ordered.  On re-assessment patient was noted to be unresponsive therefore Rapid response was initiated.  OBJECTIVE: On arrival to the bedside, she was afebrile with blood pressure 125/107 mm Hg and pulse rate 78 beats/min. There were no focal neurological deficits; she was however obtunded and unresponsive on non-rebreather  ASSESSMENT: 86 y.o. female with fecal impaction due to chronic severe constipation, complicated by pertinent comorbidities includingadvanced age, dementia,hypertension, history of stroke, depressive disorder, history of colon cancer with partial colectomy and severe chronic constipation.  PLAN: 1. Acute respiratory failure with hypoxia - STAT chest xray showed consolidation worrisome for multifocal pneumonia - ABG abnormal showing respiratory acidosis, requiring BiPAP as patient is DNR/DNI - STAT Labs ordered - Given acute change in respiratory status, I discussed goals of care with patient daughter who is the POA and plan to transfer to the ICU for airway management on BiPap, treatment of possible pneumonia, and BP management with possible pressors. Patient's daughter however declined further intervention and stated that " my mother has suffered a lot for a long period of time, it is time for her to rest". Patient was placed on comfort care and passed away shortly after comfort care initiated.     Rufina Falco, DNP, CCRN, FNP-C [facility] Nurse Practitioner Between 7pm to 7am - Pager [phone]  After 7am go to [email or web address] - password:[email or web address]  [facility] [employer]  [phone]

## 2019-10-03 NOTE — Progress Notes (Signed)
Pt passed away at [place]. Notified AC & Rufina Falco, NP. I also called and spoke with Daughter. She asked that any belongings be disposed of.

## 2019-10-03 NOTE — Progress Notes (Signed)
Rapid Response Event Note  Overview:pt lying in bed  sats 85% on [place] rapid/irregular  RR and non responsive. RT   ,AC at bedside       Initial Focused Assessment:.    Interventions:Pt placed on a NRB orders placed for Head CT,ABG. NP, came to bedside to assess and will call family to notify them of change  Plan of Care (if not transferred): pt comfort care and will remain on floor  Event summary:   at  2256    at          [facility]

## 2019-10-03 DEATH — deceased

## 2020-06-10 IMAGING — CR DG ABDOMEN 1V
1 series · 1 of 1 positions shown · non-contrast
Comparison: Radiograph 08/26/2018, CT 08/24/2018

CLINICAL DATA: Abdominal pain and distension

EXAM:
ABDOMEN - 1 VIEW

[dg abd 1 view]
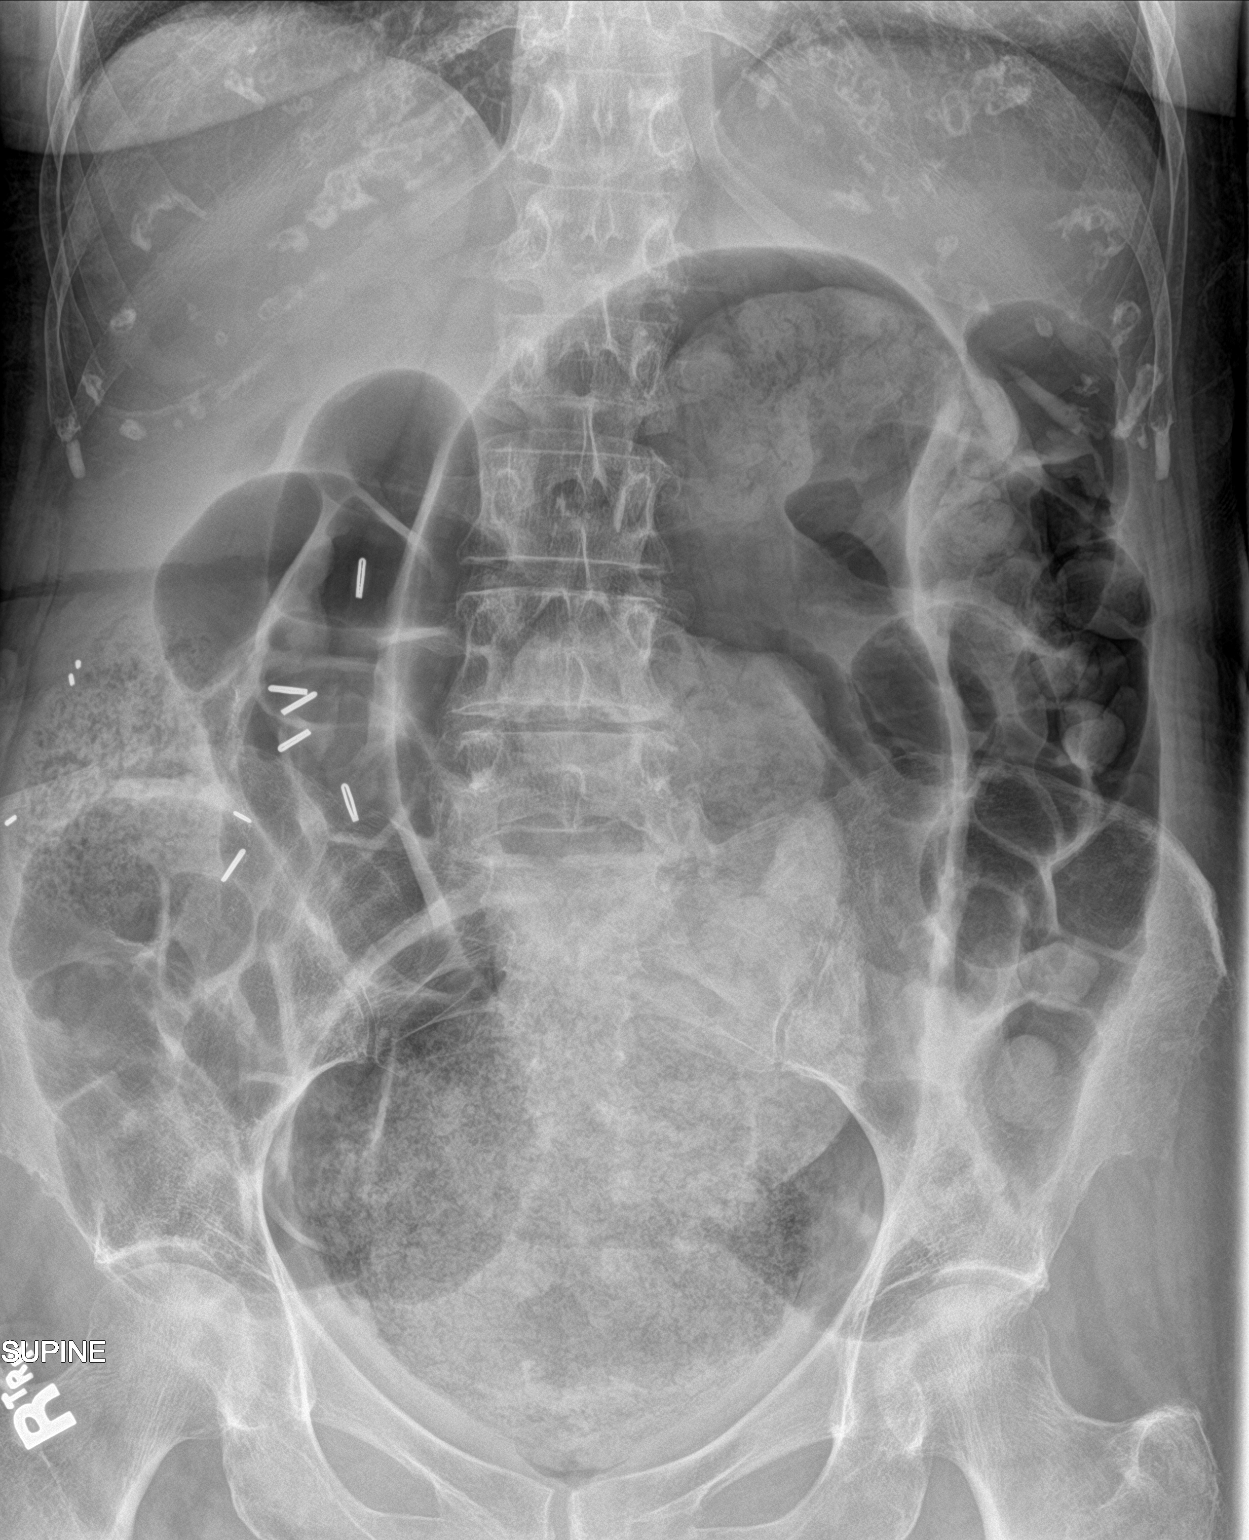

[1 of 1 positions shown; findings below may reference images not displayed]

FINDINGS: Persistent extreme gaseous distension of the rectum with large stool
ball projecting over the rectal vault. There is increasing proximal
colonic and small bowel gaseous distention when compared to prior
study. Numerous surgical clips project over the right hemiabdomen.
No acute osseous abnormality. Lung bases are unremarkable.
IMPRESSION: Increasing gaseous distention of the colon and now small bowel with
large rectal stool ball.

## 2020-06-11 IMAGING — DX DG ABDOMEN 2V
2 series · 2 of 2 positions shown · non-contrast
Comparison: Recent radiographs and CT scan.

CLINICAL DATA: Abdominal distension. NG tube placement.

EXAM:
ABDOMEN - 2 VIEW

[abdomen erect]
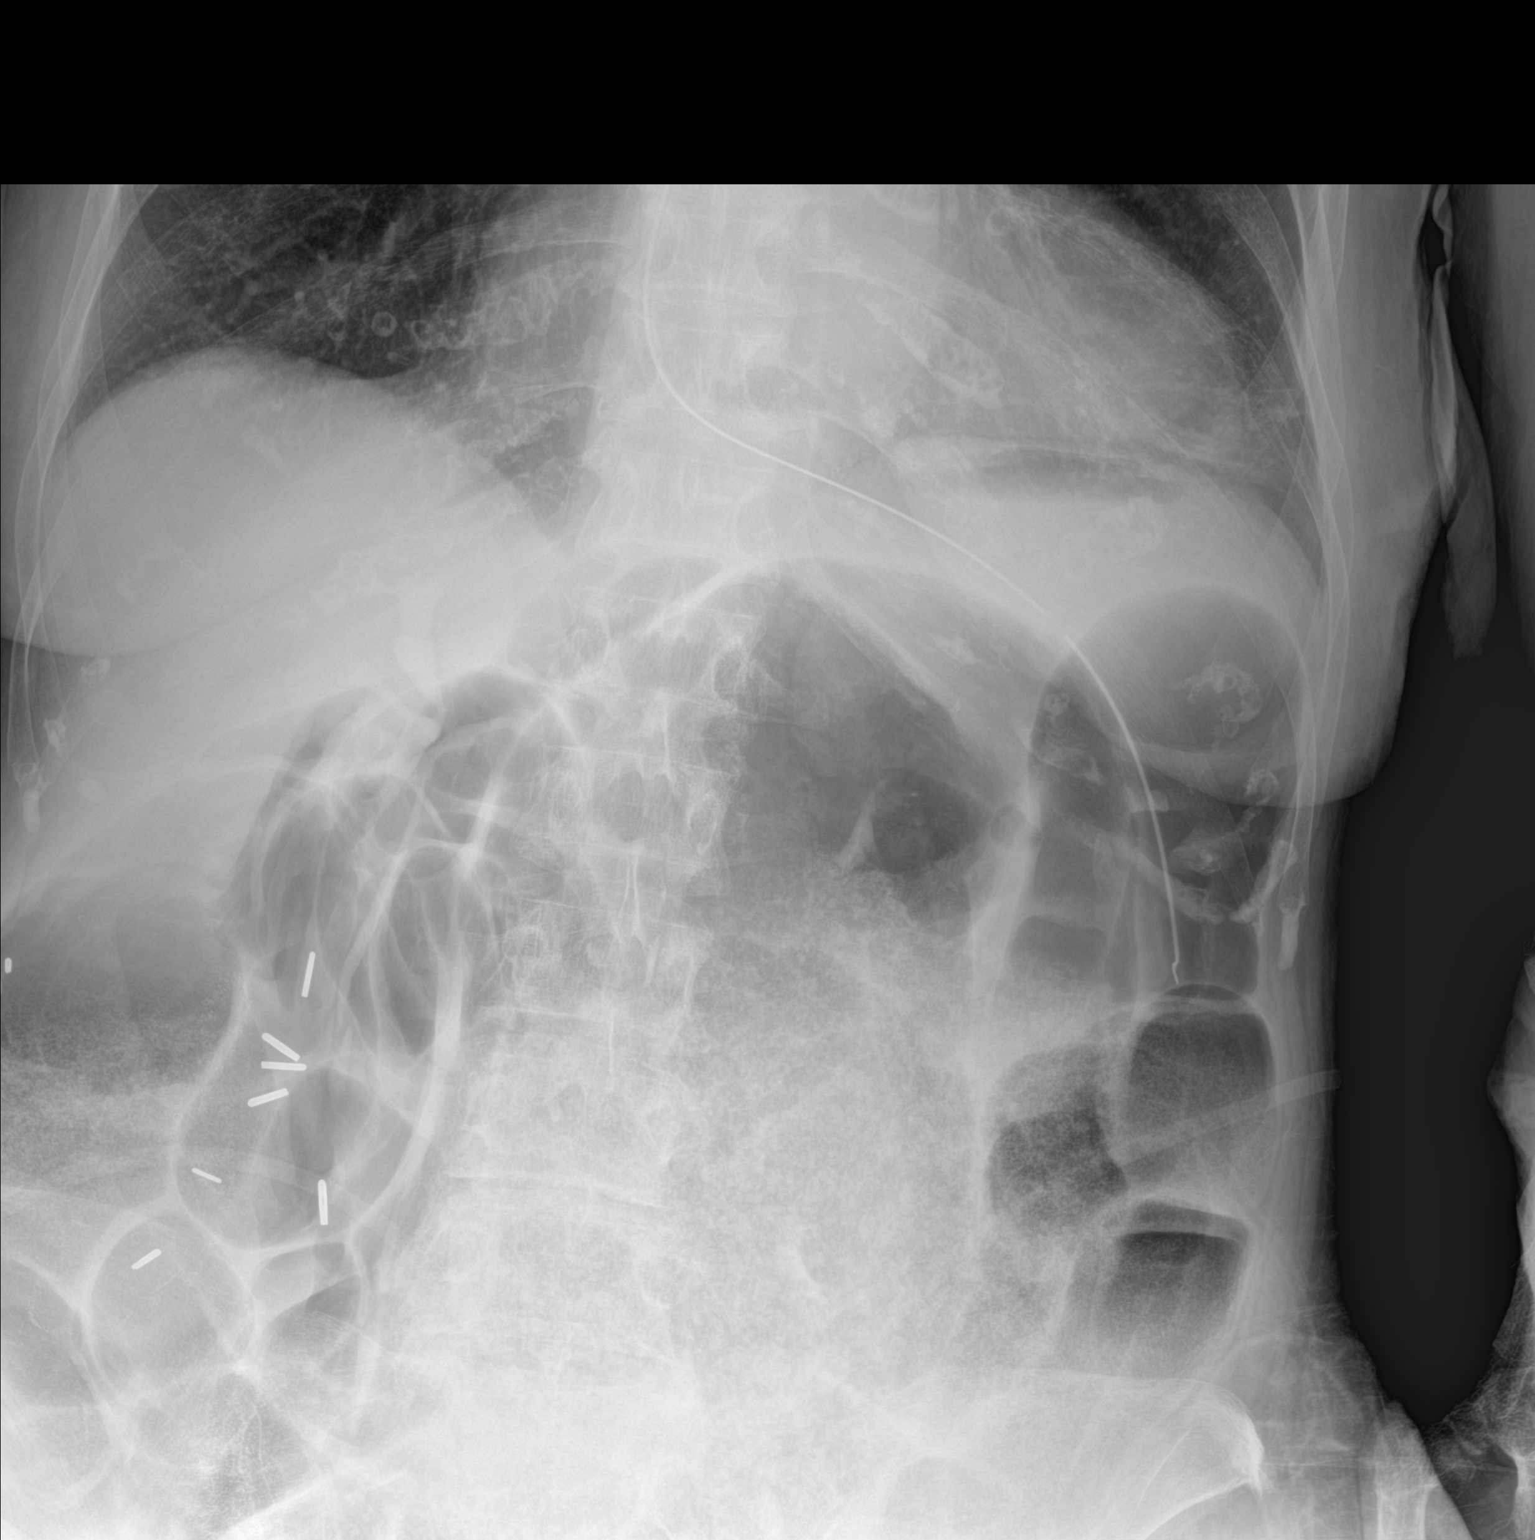

[abdomen supine]
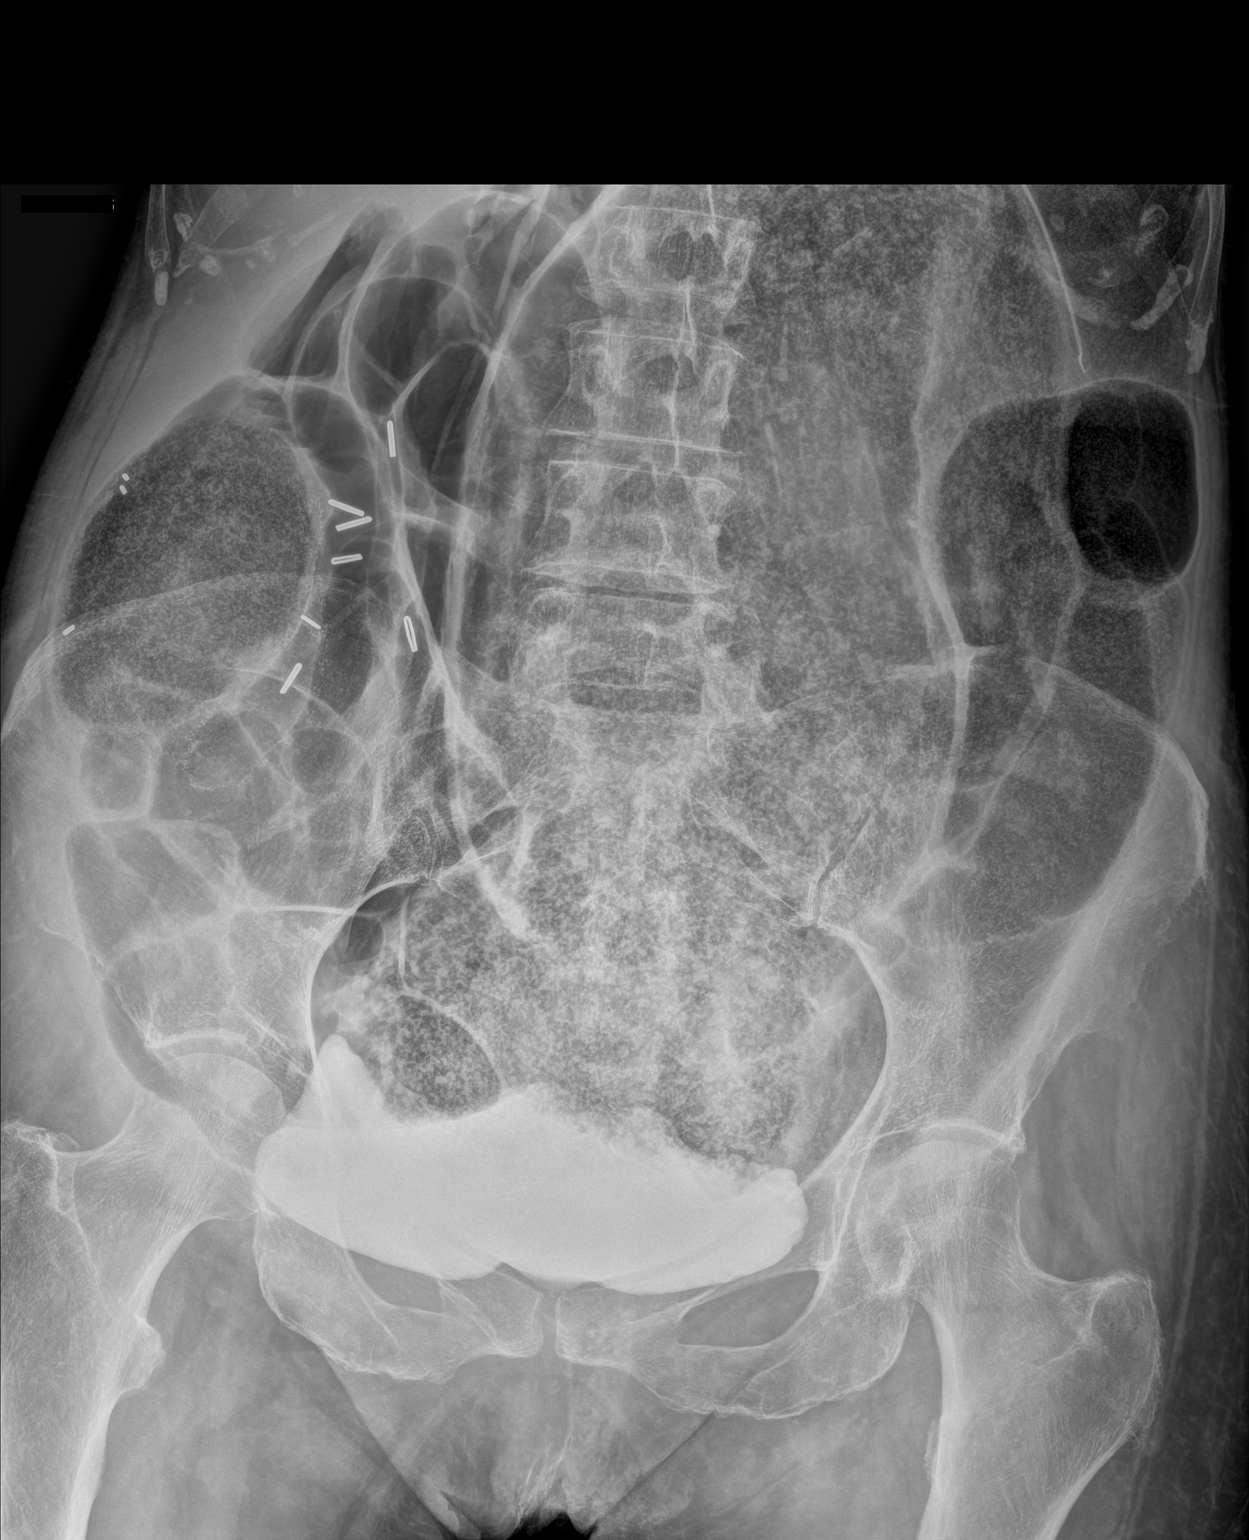

[2 of 2 positions shown; findings below may reference images not displayed]

FINDINGS: The NG tube tip is in the body region of the stomach.

Persistent massive distention of the rectum and sigmoid colon with
stool. Moderate persistent air throughout the small bowel and colon.
No free air. Contrast is noted in the renal collecting systems and
in the bladder from a recent CT scan.
IMPRESSION: Persistent massive distention of the rectum and sigmoid colon with
stool.

NG tube in good position with its tip in the body region of the
stomach.

## 2020-06-12 IMAGING — DX DG CHEST 1V PORT
1 series · 1 of 1 positions shown · non-contrast
Comparison: 10/05/2014

CLINICAL DATA: Decreased oxygen saturation.

EXAM:
PORTABLE CHEST 1 VIEW

[chest ap]
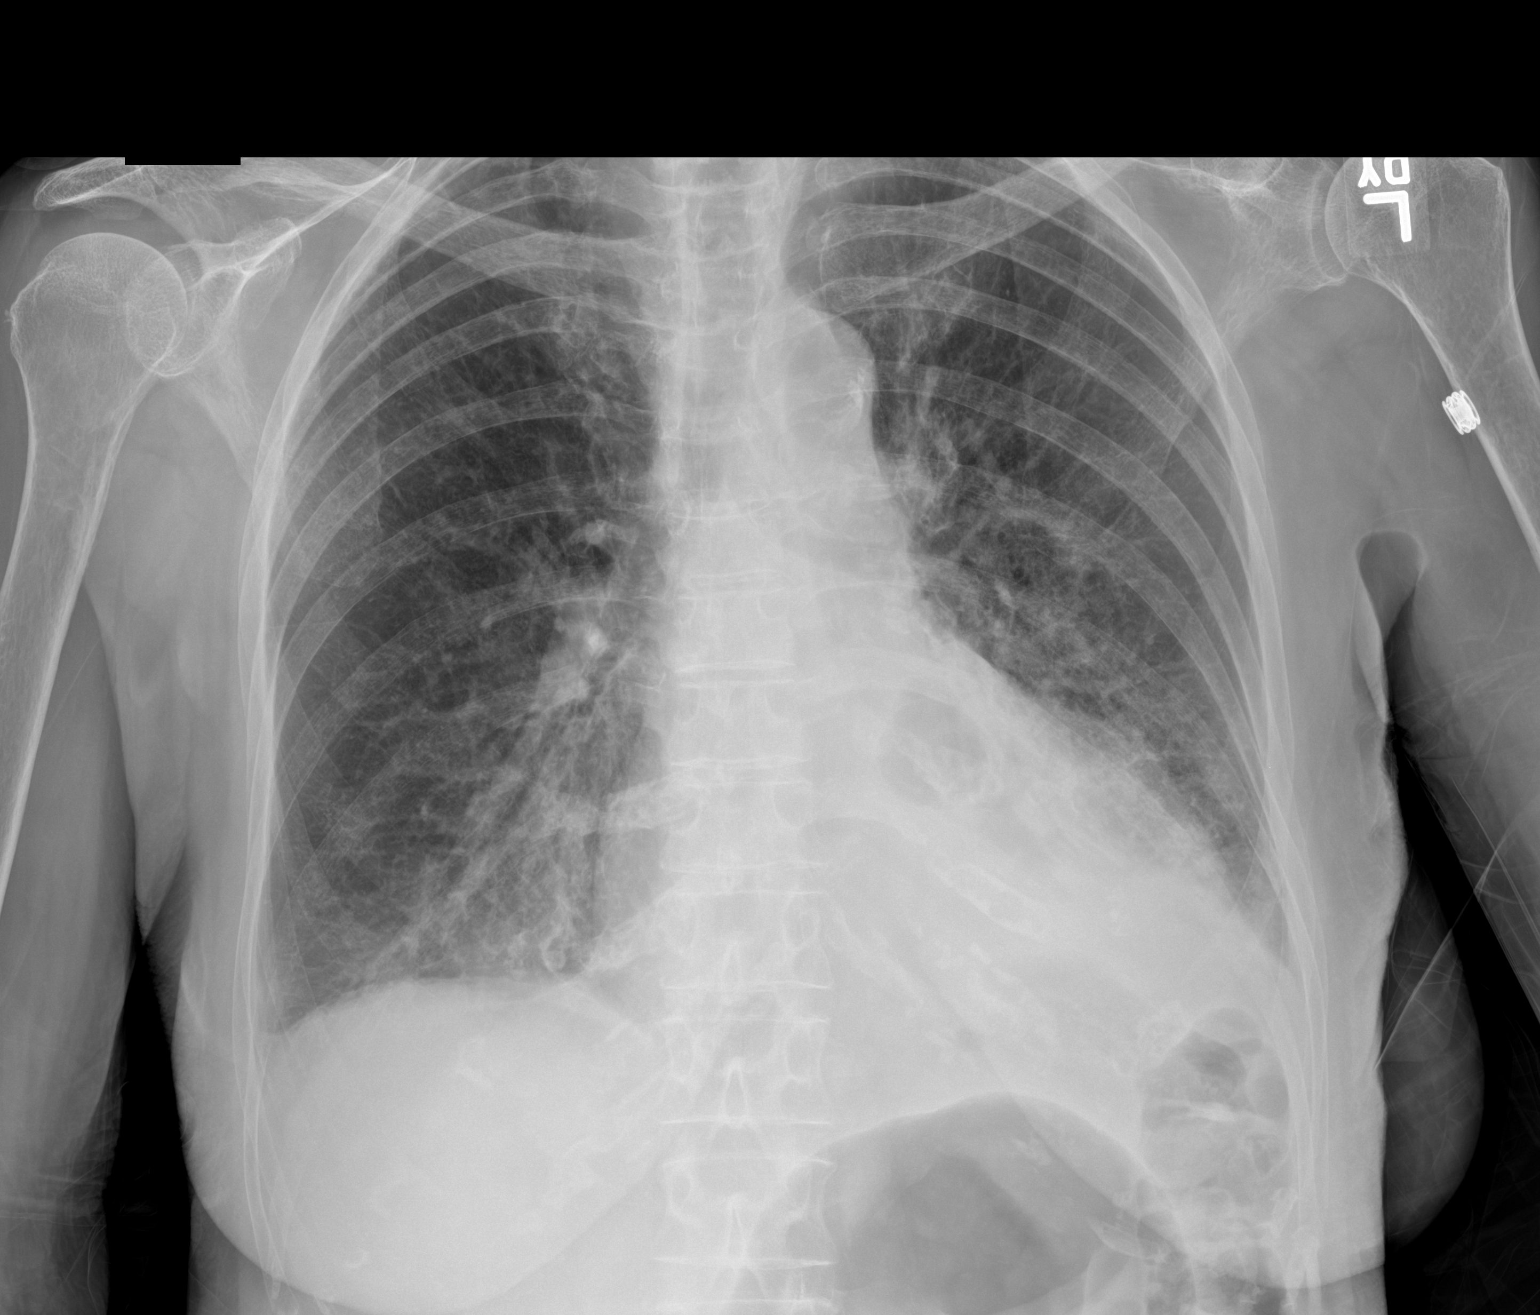

[1 of 1 positions shown; findings below may reference images not displayed]

FINDINGS: Midline trachea. Mild cardiomegaly. Probable small left greater than
right pleural effusions. No pneumothorax. Mild to moderate pulmonary
interstitial prominence. Left greater than right base airspace
disease. Atherosclerosis in the transverse aorta.
IMPRESSION: Cardiomegaly with interstitial prominence, suspicious for mild
interstitial edema.

Small bilateral pleural effusions with left greater than right base
airspace disease, atelectasis or concurrent infection/aspiration.
